# Patient Record
Sex: Female | Born: 1955 | Race: White | Hispanic: No | Marital: Married | Smoking: Former smoker
Health system: Southern US, Community
[De-identification: ages and names within clinical notes are randomized; demographics above are authoritative.]

## PROBLEM LIST (undated history)

## (undated) DIAGNOSIS — I5032 Chronic diastolic (congestive) heart failure: Secondary | ICD-10-CM

## (undated) DIAGNOSIS — I10 Essential (primary) hypertension: Secondary | ICD-10-CM

## (undated) DIAGNOSIS — K219 Gastro-esophageal reflux disease without esophagitis: Secondary | ICD-10-CM

## (undated) DIAGNOSIS — T8859XA Other complications of anesthesia, initial encounter: Secondary | ICD-10-CM

## (undated) DIAGNOSIS — E785 Hyperlipidemia, unspecified: Secondary | ICD-10-CM

## (undated) HISTORY — DX: Chronic diastolic (congestive) heart failure: I50.32

## (undated) HISTORY — PX: ABDOMINAL HYSTERECTOMY: SHX81

## (undated) HISTORY — PX: TONSILLECTOMY: SUR1361

## (undated) HISTORY — PX: BACK SURGERY: SHX140

## (undated) HISTORY — PX: HEEL SPUR EXCISION: SHX1733

## (undated) HISTORY — DX: Gastro-esophageal reflux disease without esophagitis: K21.9

---

## 1998-02-22 ENCOUNTER — Encounter: Admission: RE | Admit: 1998-02-22 | Discharge: 1998-05-23 | Payer: Self-pay | Admitting: Anesthesiology

## 1998-06-04 ENCOUNTER — Encounter: Payer: Self-pay | Admitting: *Deleted

## 1998-06-05 ENCOUNTER — Encounter: Payer: Self-pay | Admitting: *Deleted

## 1998-06-05 ENCOUNTER — Inpatient Hospital Stay (HOSPITAL_COMMUNITY): Admission: RE | Admit: 1998-06-05 | Discharge: 1998-06-07 | Payer: Self-pay | Admitting: *Deleted

## 1998-06-07 ENCOUNTER — Encounter: Payer: Self-pay | Admitting: *Deleted

## 1998-10-28 ENCOUNTER — Encounter: Payer: Self-pay | Admitting: Emergency Medicine

## 1998-10-28 ENCOUNTER — Emergency Department (HOSPITAL_COMMUNITY): Admission: EM | Admit: 1998-10-28 | Discharge: 1998-10-28 | Payer: Self-pay | Admitting: Emergency Medicine

## 1998-11-22 ENCOUNTER — Ambulatory Visit (HOSPITAL_COMMUNITY): Admission: RE | Admit: 1998-11-22 | Discharge: 1998-11-22 | Payer: Self-pay | Admitting: *Deleted

## 1998-11-22 ENCOUNTER — Encounter: Payer: Self-pay | Admitting: *Deleted

## 1999-11-25 ENCOUNTER — Other Ambulatory Visit: Admission: RE | Admit: 1999-11-25 | Discharge: 1999-11-25 | Payer: Self-pay | Admitting: *Deleted

## 1999-12-12 ENCOUNTER — Encounter: Payer: Self-pay | Admitting: *Deleted

## 1999-12-12 ENCOUNTER — Encounter: Admission: RE | Admit: 1999-12-12 | Discharge: 1999-12-12 | Payer: Self-pay | Admitting: *Deleted

## 1999-12-16 ENCOUNTER — Encounter: Admission: RE | Admit: 1999-12-16 | Discharge: 1999-12-16 | Payer: Self-pay | Admitting: *Deleted

## 1999-12-16 ENCOUNTER — Encounter: Payer: Self-pay | Admitting: *Deleted

## 2003-05-21 ENCOUNTER — Emergency Department (HOSPITAL_COMMUNITY): Admission: EM | Admit: 2003-05-21 | Discharge: 2003-05-21 | Payer: Self-pay | Admitting: Emergency Medicine

## 2004-03-12 ENCOUNTER — Emergency Department (HOSPITAL_COMMUNITY): Admission: EM | Admit: 2004-03-12 | Discharge: 2004-03-12 | Payer: Self-pay | Admitting: Emergency Medicine

## 2006-02-26 ENCOUNTER — Encounter: Admission: RE | Admit: 2006-02-26 | Discharge: 2006-02-26 | Payer: Self-pay | Admitting: Family Medicine

## 2011-05-28 ENCOUNTER — Other Ambulatory Visit (HOSPITAL_COMMUNITY): Payer: Self-pay | Admitting: Physician Assistant

## 2011-05-28 DIAGNOSIS — Z139 Encounter for screening, unspecified: Secondary | ICD-10-CM

## 2011-06-02 ENCOUNTER — Other Ambulatory Visit: Payer: Self-pay | Admitting: Obstetrics and Gynecology

## 2011-06-02 DIAGNOSIS — Z139 Encounter for screening, unspecified: Secondary | ICD-10-CM

## 2011-06-03 ENCOUNTER — Ambulatory Visit (HOSPITAL_COMMUNITY)
Admission: RE | Admit: 2011-06-03 | Discharge: 2011-06-03 | Disposition: A | Discharge status: Home / Self Care | Payer: Self-pay | Source: Ambulatory Visit | Attending: Physician Assistant | Admitting: Physician Assistant

## 2011-06-03 DIAGNOSIS — Z139 Encounter for screening, unspecified: Secondary | ICD-10-CM

## 2011-07-02 ENCOUNTER — Ambulatory Visit (HOSPITAL_COMMUNITY)
Admission: RE | Admit: 2011-07-02 | Discharge: 2011-07-02 | Disposition: A | Discharge status: Home / Self Care | Payer: Self-pay | Source: Ambulatory Visit | Attending: Physician Assistant | Admitting: Physician Assistant

## 2011-07-02 ENCOUNTER — Other Ambulatory Visit (HOSPITAL_COMMUNITY): Payer: Self-pay | Admitting: Physician Assistant

## 2011-07-02 DIAGNOSIS — R609 Edema, unspecified: Secondary | ICD-10-CM

## 2011-07-02 DIAGNOSIS — M7989 Other specified soft tissue disorders: Secondary | ICD-10-CM | POA: Insufficient documentation

## 2012-03-10 ENCOUNTER — Other Ambulatory Visit (HOSPITAL_COMMUNITY): Payer: Self-pay | Admitting: Physician Assistant

## 2012-03-10 ENCOUNTER — Ambulatory Visit (HOSPITAL_COMMUNITY)
Admission: RE | Admit: 2012-03-10 | Discharge: 2012-03-10 | Disposition: A | Discharge status: Home / Self Care | Payer: Self-pay | Source: Ambulatory Visit | Attending: Physician Assistant | Admitting: Physician Assistant

## 2012-03-10 DIAGNOSIS — R52 Pain, unspecified: Secondary | ICD-10-CM

## 2012-03-10 DIAGNOSIS — W19XXXA Unspecified fall, initial encounter: Secondary | ICD-10-CM | POA: Insufficient documentation

## 2012-03-10 DIAGNOSIS — S4980XA Other specified injuries of shoulder and upper arm, unspecified arm, initial encounter: Secondary | ICD-10-CM | POA: Insufficient documentation

## 2012-03-10 DIAGNOSIS — M25519 Pain in unspecified shoulder: Secondary | ICD-10-CM | POA: Insufficient documentation

## 2012-03-10 DIAGNOSIS — S46909A Unspecified injury of unspecified muscle, fascia and tendon at shoulder and upper arm level, unspecified arm, initial encounter: Secondary | ICD-10-CM | POA: Insufficient documentation

## 2012-05-10 ENCOUNTER — Other Ambulatory Visit (HOSPITAL_COMMUNITY): Payer: Self-pay | Admitting: Physician Assistant

## 2012-05-10 DIAGNOSIS — M25512 Pain in left shoulder: Secondary | ICD-10-CM

## 2012-05-13 ENCOUNTER — Ambulatory Visit (HOSPITAL_COMMUNITY): Payer: Self-pay

## 2012-06-10 ENCOUNTER — Other Ambulatory Visit (HOSPITAL_COMMUNITY): Payer: Self-pay | Admitting: Physician Assistant

## 2012-06-10 DIAGNOSIS — Z139 Encounter for screening, unspecified: Secondary | ICD-10-CM

## 2012-06-21 ENCOUNTER — Ambulatory Visit (HOSPITAL_COMMUNITY)
Admission: RE | Admit: 2012-06-21 | Discharge: 2012-06-21 | Disposition: A | Discharge status: Home / Self Care | Payer: Self-pay | Source: Ambulatory Visit | Attending: Physician Assistant | Admitting: Physician Assistant

## 2012-06-21 DIAGNOSIS — Z139 Encounter for screening, unspecified: Secondary | ICD-10-CM

## 2013-07-12 ENCOUNTER — Emergency Department (HOSPITAL_COMMUNITY): Payer: Self-pay

## 2013-07-12 ENCOUNTER — Encounter (HOSPITAL_COMMUNITY): Payer: Self-pay | Admitting: Emergency Medicine

## 2013-07-12 ENCOUNTER — Emergency Department (HOSPITAL_COMMUNITY)
Admission: EM | Admit: 2013-07-12 | Discharge: 2013-07-12 | Disposition: A | Discharge status: Home / Self Care | Payer: Self-pay | Attending: Emergency Medicine | Admitting: Emergency Medicine

## 2013-07-12 DIAGNOSIS — S300XXA Contusion of lower back and pelvis, initial encounter: Secondary | ICD-10-CM

## 2013-07-12 DIAGNOSIS — Z7982 Long term (current) use of aspirin: Secondary | ICD-10-CM | POA: Insufficient documentation

## 2013-07-12 DIAGNOSIS — I10 Essential (primary) hypertension: Secondary | ICD-10-CM | POA: Insufficient documentation

## 2013-07-12 DIAGNOSIS — IMO0002 Reserved for concepts with insufficient information to code with codable children: Secondary | ICD-10-CM | POA: Insufficient documentation

## 2013-07-12 DIAGNOSIS — Y939 Activity, unspecified: Secondary | ICD-10-CM | POA: Insufficient documentation

## 2013-07-12 DIAGNOSIS — S8392XA Sprain of unspecified site of left knee, initial encounter: Secondary | ICD-10-CM

## 2013-07-12 DIAGNOSIS — E785 Hyperlipidemia, unspecified: Secondary | ICD-10-CM | POA: Insufficient documentation

## 2013-07-12 DIAGNOSIS — Y9229 Other specified public building as the place of occurrence of the external cause: Secondary | ICD-10-CM | POA: Insufficient documentation

## 2013-07-12 DIAGNOSIS — Z79899 Other long term (current) drug therapy: Secondary | ICD-10-CM | POA: Insufficient documentation

## 2013-07-12 DIAGNOSIS — W010XXA Fall on same level from slipping, tripping and stumbling without subsequent striking against object, initial encounter: Secondary | ICD-10-CM | POA: Insufficient documentation

## 2013-07-12 DIAGNOSIS — S20229A Contusion of unspecified back wall of thorax, initial encounter: Secondary | ICD-10-CM | POA: Insufficient documentation

## 2013-07-12 HISTORY — DX: Hyperlipidemia, unspecified: E78.5

## 2013-07-12 HISTORY — DX: Essential (primary) hypertension: I10

## 2013-07-12 MED ORDER — CYCLOBENZAPRINE HCL 10 MG PO TABS: 10.0000 mg | ORAL_TABLET | Freq: Three times a day (TID) | ORAL | Status: DC | PRN

## 2013-07-12 MED ORDER — IBUPROFEN 800 MG PO TABS
800.0000 mg | ORAL_TABLET | Freq: Once | ORAL | Status: AC
  Administered 2013-07-12: 800 mg via ORAL
  Filled 2013-07-12: qty 1

## 2013-07-12 MED ORDER — IBUPROFEN 800 MG PO TABS: 800.0000 mg | ORAL_TABLET | Freq: Three times a day (TID) | ORAL | Status: DC

## 2013-07-12 MED ORDER — HYDROCODONE-ACETAMINOPHEN 5-325 MG PO TABS: ORAL_TABLET | ORAL | Status: DC

## 2013-07-12 NOTE — ED Notes (Signed)
Walks slowly, Alert, NAD, pain lt knee , lt low back, after fall at noon today.  Denies LOC

## 2013-07-12 NOTE — ED Notes (Signed)
Pain left side,  Knee , low back, onset 12 noon when slipped on wet floor at [facility].  No LOC.  Alert, No HI

## 2013-07-12 NOTE — ED Notes (Signed)
Ace wrap to lt knee.  

## 2013-07-14 NOTE — ED Provider Notes (Signed)
CSN: [id number]     Arrival date & time 07/12/13  1350 History   First MD Initiated Contact with Patient 07/12/13 1431     Chief Complaint  Patient presents with  . Fall   (Consider location/radiation/quality/duration/timing/severity/associated sxs/prior Treatment) Patient is a 57 y.o. female presenting with fall. The history is provided by the patient.  Fall This is a new problem. The current episode started today. The problem occurs constantly. The problem has been unchanged. Associated symptoms include arthralgias. Pertinent negatives include no abdominal pain, chest pain, chills, diaphoresis, fever, headaches, joint swelling, nausea, neck pain, numbness, urinary symptoms, visual change, vomiting or weakness. The symptoms are aggravated by standing and walking. She has tried nothing for the symptoms. The treatment provided no relief.   Patient c/o low back pain, left knee and ankle pain after a fall at a [employer].  She denies Head injury, neck pain, dizziness or LOC.  Past Medical History  Diagnosis Date  . Hypertension   . Hyperlipemia    Past Surgical History  Procedure Laterality Date  . Abdominal hysterectomy    . Heel spur excision    . Back surgery    . Tonsillectomy     History reviewed. No pertinent family history. History  Substance Use Topics  . Smoking status: Never Smoker   . Smokeless tobacco: Not on file  . Alcohol Use: No   OB History   Grav Para Term Preterm Abortions TAB SAB Ect Mult Living                 Review of Systems  Constitutional: Negative for fever, chills and diaphoresis.  Respiratory: Negative for shortness of breath.   Cardiovascular: Negative for chest pain.  Gastrointestinal: Negative for nausea, vomiting and abdominal pain.  Genitourinary: Negative for dysuria, hematuria and difficulty urinating.  Musculoskeletal: Positive for arthralgias. Negative for joint swelling and neck pain.  Skin: Negative for color change and wound.   Neurological: Negative for dizziness, syncope, speech difficulty, weakness, numbness and headaches.  All other systems reviewed and are negative.    Allergies  Neurontin  Home Medications   Current Outpatient Rx  Name  Route  Sig  Dispense  Refill  . aspirin 81 MG chewable tablet   Oral   Chew 81 mg by mouth daily.         Marland Kitchen CALCIUM PO   Oral   Take 1 tablet by mouth daily.         Marland Kitchen lisinopril-hydrochlorothiazide (PRINZIDE,ZESTORETIC) 20-12.5 MG per tablet   Oral   Take 2 tablets by mouth daily.         . Multiple Vitamins-Minerals (WOMENS DAILY FORMULA PO)   Oral   Take 1 tablet by mouth daily.         . Omega-3 Fatty Acids (FISH OIL) 1000 MG CAPS   Oral   Take 2,000 capsules by mouth 2 (two) times daily.         . ranitidine (ZANTAC) 300 MG tablet   Oral   Take 300 mg by mouth daily as needed for heartburn.         . cyclobenzaprine (FLEXERIL) 10 MG tablet   Oral   Take 1 tablet (10 mg total) by mouth 3 (three) times daily as needed.   21 tablet   0   . HYDROcodone-acetaminophen (NORCO/VICODIN) 5-325 MG per tablet      Take one-two tabs po q 4-6 hrs prn pain   12 tablet   0   .  ibuprofen (ADVIL,MOTRIN) 800 MG tablet   Oral   Take 1 tablet (800 mg total) by mouth 3 (three) times daily.   21 tablet   0    BP 147/68  Pulse 94  Temp(Src) 98.4 F (36.9 C) (Oral)  Ht 5\' 9"  (1.753 m)  Wt 280 lb (127.007 kg)  BMI 41.33 kg/m2  SpO2 100% Physical Exam  Nursing note and vitals reviewed. Constitutional: She is oriented to person, place, and time. She appears well-developed and well-nourished. No distress.  HENT:  Head: Normocephalic and atraumatic.  Eyes: EOM are normal. Pupils are equal, round, and reactive to light.  Neck: Normal range of motion. Neck supple.  Cardiovascular: Normal rate, regular rhythm, normal heart sounds and intact distal pulses.   No murmur heard. Pulmonary/Chest: Effort normal and breath sounds normal. No respiratory  distress. She exhibits no tenderness.  Abdominal: Soft. She exhibits no distension. There is no tenderness. There is no rebound and no guarding.  Musculoskeletal: She exhibits tenderness.  ttp of the anterior left knee and lateral left ankle.  No erythema, effusion, or step-off deformity.  DP pulse brisk, distal sensation intact. Calf is soft and NT.  Left hip is NT.  Left lumbar paraspinal muscles are ttp .  No bruising or edema.    Neurological: She is alert and oriented to person, place, and time. She exhibits normal muscle tone. Coordination normal.  Skin: Skin is warm and dry. No erythema.    ED Course  Procedures (including critical care time) Labs Review Labs Reviewed - No data to display Imaging Review Dg Lumbar Spine Complete  07/12/2013   CLINICAL DATA:  Low back pain following a fall.  EXAM: LUMBAR SPINE - COMPLETE 4+ VIEW  COMPARISON:  None.  FINDINGS: Five non-rib-bearing lumbar vertebrae. Mild dextroconvex rotary scoliosis. 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 Mild to moderate anterior and lateral spur formation at multiple levels. Facet degenerative changes in the lower lumbar spine. No fractures, pars defects or subluxations seen.  IMPRESSION: 1. No fracture or subluxation. 2. Degenerative and postoperative changes, as described above.   Electronically Signed   By: Gordan Payment M.D.   On: 07/12/2013 15:30   Dg Ankle Complete Left  07/12/2013   CLINICAL DATA:  Left ankle pain and swelling following a fall.  EXAM: LEFT ANKLE COMPLETE - 3+ VIEW  COMPARISON:  None.  FINDINGS: Large calcaneal spurs.  No fracture, dislocation or effusion seen.  IMPRESSION: No fracture.   Electronically Signed   By: Gordan Payment M.D.   On: 07/12/2013 15:26   Dg Knee Complete 4 Views Left  07/12/2013   CLINICAL DATA:  Left knee pain and swelling following a fall.  EXAM: LEFT KNEE - COMPLETE 4+ VIEW  COMPARISON:  None.  FINDINGS: Mild degenerative changes.  No fracture, dislocation or  effusion.  IMPRESSION: No fracture or effusion.   Electronically Signed   By: Gordan Payment M.D.   On: 07/12/2013 15:29    EKG Interpretation   None       MDM   1. Knee sprain, left, initial encounter   2. Lumbar contusion, initial encounter    X-ray findings reviewed and discussed with pt.  Remains NV intact.  Pt agrees to RICE therapy and close f/u with her PMD or orthopedics.  Incidental heel spur on imaging, advised pt to podiatry f/u, referral  Given. Pt ambulatory, no focal neuro deficits.       Angle Dirusso L. Trisha Mangle, PA-C 07/14/13 2131

## 2013-07-15 NOTE — ED Provider Notes (Signed)
Medical screening examination/treatment/procedure(s) were performed by non-physician practitioner and as supervising physician I was immediately available for consultation/collaboration.  EKG Interpretation   None         Emmi Wertheim L Nachman Sundt, MD 07/15/13 1536 

## 2013-07-19 ENCOUNTER — Other Ambulatory Visit (HOSPITAL_COMMUNITY): Payer: Self-pay | Admitting: Physician Assistant

## 2013-07-19 ENCOUNTER — Other Ambulatory Visit (HOSPITAL_COMMUNITY): Payer: Self-pay | Admitting: *Deleted

## 2013-07-19 DIAGNOSIS — Z139 Encounter for screening, unspecified: Secondary | ICD-10-CM

## 2013-08-02 ENCOUNTER — Ambulatory Visit (HOSPITAL_COMMUNITY)
Admission: RE | Admit: 2013-08-02 | Discharge: 2013-08-02 | Disposition: A | Discharge status: Home / Self Care | Payer: Self-pay | Source: Ambulatory Visit | Attending: Physician Assistant | Admitting: Physician Assistant

## 2013-08-02 DIAGNOSIS — Z139 Encounter for screening, unspecified: Secondary | ICD-10-CM

## 2014-01-12 ENCOUNTER — Telehealth (HOSPITAL_COMMUNITY): Payer: Self-pay | Admitting: Dietician

## 2014-01-12 NOTE — Telephone Encounter (Signed)
Received call from pt at 1113. She is scheduled for group DM class tonight, however, she reports that she is unable to attend due to illness (sore throat). This RD offered to reschedule, but she declined at this time.

## 2014-07-26 ENCOUNTER — Other Ambulatory Visit (HOSPITAL_COMMUNITY): Payer: Self-pay | Admitting: Family Medicine

## 2014-07-26 ENCOUNTER — Ambulatory Visit (HOSPITAL_COMMUNITY)
Admission: RE | Admit: 2014-07-26 | Discharge: 2014-07-26 | Disposition: A | Discharge status: Home / Self Care | Payer: 59 | Source: Ambulatory Visit | Attending: Family Medicine | Admitting: Family Medicine

## 2014-07-26 DIAGNOSIS — M79641 Pain in right hand: Secondary | ICD-10-CM

## 2014-07-26 DIAGNOSIS — M25741 Osteophyte, right hand: Secondary | ICD-10-CM | POA: Insufficient documentation

## 2014-07-26 DIAGNOSIS — R229 Localized swelling, mass and lump, unspecified: Secondary | ICD-10-CM | POA: Diagnosis present

## 2014-08-15 ENCOUNTER — Other Ambulatory Visit (HOSPITAL_COMMUNITY): Payer: Self-pay | Admitting: Family Medicine

## 2014-08-15 DIAGNOSIS — Z1231 Encounter for screening mammogram for malignant neoplasm of breast: Secondary | ICD-10-CM

## 2014-08-21 ENCOUNTER — Ambulatory Visit (HOSPITAL_COMMUNITY)
Admission: RE | Admit: 2014-08-21 | Discharge: 2014-08-21 | Disposition: A | Discharge status: Home / Self Care | Payer: 59 | Source: Ambulatory Visit | Attending: Family Medicine | Admitting: Family Medicine

## 2014-08-21 DIAGNOSIS — Z1231 Encounter for screening mammogram for malignant neoplasm of breast: Secondary | ICD-10-CM | POA: Insufficient documentation

## 2014-08-21 DIAGNOSIS — N63 Unspecified lump in breast: Secondary | ICD-10-CM | POA: Diagnosis not present

## 2014-08-25 ENCOUNTER — Other Ambulatory Visit: Payer: Self-pay | Admitting: Internal Medicine

## 2014-08-25 DIAGNOSIS — R928 Other abnormal and inconclusive findings on diagnostic imaging of breast: Secondary | ICD-10-CM

## 2014-09-01 ENCOUNTER — Ambulatory Visit
Admission: RE | Admit: 2014-09-01 | Discharge: 2014-09-01 | Disposition: A | Discharge status: Home / Self Care | Payer: 59 | Source: Ambulatory Visit | Attending: Internal Medicine | Admitting: Internal Medicine

## 2014-09-01 DIAGNOSIS — R928 Other abnormal and inconclusive findings on diagnostic imaging of breast: Secondary | ICD-10-CM

## 2015-02-26 ENCOUNTER — Ambulatory Visit (HOSPITAL_COMMUNITY)
Admission: RE | Admit: 2015-02-26 | Discharge: 2015-02-26 | Disposition: A | Discharge status: Home / Self Care | Payer: 59 | Source: Ambulatory Visit | Attending: Physician Assistant | Admitting: Physician Assistant

## 2015-02-26 ENCOUNTER — Other Ambulatory Visit (HOSPITAL_COMMUNITY): Payer: Self-pay | Admitting: Physician Assistant

## 2015-02-26 DIAGNOSIS — M11261 Other chondrocalcinosis, right knee: Secondary | ICD-10-CM | POA: Insufficient documentation

## 2015-02-26 DIAGNOSIS — M25561 Pain in right knee: Secondary | ICD-10-CM | POA: Diagnosis present

## 2015-02-26 DIAGNOSIS — X58XXXA Exposure to other specified factors, initial encounter: Secondary | ICD-10-CM | POA: Diagnosis not present

## 2015-02-26 DIAGNOSIS — S8991XA Unspecified injury of right lower leg, initial encounter: Secondary | ICD-10-CM | POA: Insufficient documentation

## 2015-04-07 ENCOUNTER — Emergency Department (HOSPITAL_COMMUNITY)
Admission: EM | Admit: 2015-04-07 | Discharge: 2015-04-07 | Disposition: A | Discharge status: Home / Self Care | Payer: 59 | Attending: Emergency Medicine | Admitting: Emergency Medicine

## 2015-04-07 ENCOUNTER — Encounter (HOSPITAL_COMMUNITY): Payer: Self-pay | Admitting: Emergency Medicine

## 2015-04-07 DIAGNOSIS — Y998 Other external cause status: Secondary | ICD-10-CM | POA: Insufficient documentation

## 2015-04-07 DIAGNOSIS — Z79899 Other long term (current) drug therapy: Secondary | ICD-10-CM | POA: Insufficient documentation

## 2015-04-07 DIAGNOSIS — Z8639 Personal history of other endocrine, nutritional and metabolic disease: Secondary | ICD-10-CM | POA: Insufficient documentation

## 2015-04-07 DIAGNOSIS — I10 Essential (primary) hypertension: Secondary | ICD-10-CM | POA: Diagnosis not present

## 2015-04-07 DIAGNOSIS — S4992XA Unspecified injury of left shoulder and upper arm, initial encounter: Secondary | ICD-10-CM | POA: Diagnosis present

## 2015-04-07 DIAGNOSIS — Y9389 Activity, other specified: Secondary | ICD-10-CM | POA: Diagnosis not present

## 2015-04-07 DIAGNOSIS — Z7982 Long term (current) use of aspirin: Secondary | ICD-10-CM | POA: Diagnosis not present

## 2015-04-07 DIAGNOSIS — Y9289 Other specified places as the place of occurrence of the external cause: Secondary | ICD-10-CM | POA: Insufficient documentation

## 2015-04-07 DIAGNOSIS — S46912A Strain of unspecified muscle, fascia and tendon at shoulder and upper arm level, left arm, initial encounter: Secondary | ICD-10-CM

## 2015-04-07 DIAGNOSIS — X58XXXA Exposure to other specified factors, initial encounter: Secondary | ICD-10-CM | POA: Diagnosis not present

## 2015-04-07 MED ORDER — MIDAZOLAM HCL 5 MG/5ML IJ SOLN
2.0000 mg | Freq: Once | INTRAMUSCULAR | Status: AC
  Administered 2015-04-07: 2 mg via INTRAMUSCULAR
  Filled 2015-04-07: qty 5

## 2015-04-07 MED ORDER — ONDANSETRON HCL 4 MG/2ML IJ SOLN
4.0000 mg | Freq: Once | INTRAMUSCULAR | Status: AC
  Administered 2015-04-07: 4 mg via INTRAMUSCULAR
  Filled 2015-04-07: qty 2

## 2015-04-07 MED ORDER — HYDROMORPHONE HCL 1 MG/ML IJ SOLN
1.0000 mg | Freq: Once | INTRAMUSCULAR | Status: AC
  Administered 2015-04-07: 1 mg via INTRAVENOUS
  Filled 2015-04-07: qty 1

## 2015-04-07 MED ORDER — KETOROLAC TROMETHAMINE 60 MG/2ML IM SOLN
60.0000 mg | Freq: Once | INTRAMUSCULAR | Status: AC
  Administered 2015-04-07: 60 mg via INTRAMUSCULAR
  Filled 2015-04-07: qty 2

## 2015-04-07 MED ORDER — DIAZEPAM 5 MG/ML IJ SOLN
5.0000 mg | Freq: Once | INTRAMUSCULAR | Status: AC
  Administered 2015-04-07: 5 mg via INTRAVENOUS
  Filled 2015-04-07: qty 2

## 2015-04-07 NOTE — ED Provider Notes (Signed)
CSN: [id number]     Arrival date & time 04/07/15  1350 History   First MD Initiated Contact with Patient 04/07/15 1424     Chief Complaint  Patient presents with  . Shoulder Pain     (Consider location/radiation/quality/duration/timing/severity/associated sxs/prior Treatment) Patient is a 59 y.o. female presenting with shoulder pain. The history is provided by the patient.  Shoulder Pain Location:  Shoulder Time since incident:  2 days Injury: no   Shoulder location:  L shoulder Pain details:    Quality:  Sharp and cramping   Severity:  Moderate   Onset quality:  Sudden   Duration:  2 days   Timing:  Constant   Progression:  Worsening Chronicity:  New Handedness:  Right-handed Dislocation: no   Prior injury to area:  No Relieved by:  Nothing Worsened by:  Movement Associated symptoms: neck pain and stiffness   Associated symptoms: no numbness and no tingling   Risk factors: no frequent fractures and no recent illness     Past Medical History  Diagnosis Date  . Hypertension   . Hyperlipemia    Past Surgical History  Procedure Laterality Date  . Abdominal hysterectomy    . Heel spur excision    . Back surgery    . Tonsillectomy     History reviewed. No pertinent family history. Social History  Substance Use Topics  . Smoking status: Never Smoker   . Smokeless tobacco: None  . Alcohol Use: No   OB History    No data available     Review of Systems  Musculoskeletal: Positive for arthralgias, stiffness and neck pain.  All other systems reviewed and are negative.     Allergies  Neurontin  Home Medications   Prior to Admission medications   Medication Sig Start Date End Date Taking? Authorizing Kailena Lubas  aspirin 81 MG chewable tablet Chew 81 mg by mouth daily.    Historical Edana Aguado, MD  CALCIUM PO Take 1 tablet by mouth daily.    Historical Tula Schryver, MD  cyclobenzaprine (FLEXERIL) 10 MG tablet Take 1 tablet (10 mg total) by mouth 3 (three) times  daily as needed. 07/12/13   Tammy Triplett, PA-C  HYDROcodone-acetaminophen (NORCO/VICODIN) 5-325 MG per tablet Take one-two tabs po q 4-6 hrs prn pain 07/12/13   Tammy Triplett, PA-C  ibuprofen (ADVIL,MOTRIN) 800 MG tablet Take 1 tablet (800 mg total) by mouth 3 (three) times daily. 07/12/13   Tammy Triplett, PA-C  lisinopril-hydrochlorothiazide (PRINZIDE,ZESTORETIC) 20-12.5 MG per tablet Take 2 tablets by mouth daily.    Historical Connar Keating, MD  Multiple Vitamins-Minerals (WOMENS DAILY FORMULA PO) Take 1 tablet by mouth daily.    Historical Toma Erichsen, MD  Omega-3 Fatty Acids (FISH OIL) 1000 MG CAPS Take 2,000 capsules by mouth 2 (two) times daily.    Historical Enmanuel Zufall, MD  ranitidine (ZANTAC) 300 MG tablet Take 300 mg by mouth daily as needed for heartburn.    Historical Jamesmichael Shadd, MD   BP 124/78 mmHg  Pulse 67  Temp(Src) 97.6 F (36.4 C) (Oral)  Resp 18  Ht 5\' 8"  (1.727 m)  Wt 304 lb (137.893 kg)  BMI 46.23 kg/m2  SpO2 99% Physical Exam  Constitutional: She is oriented to person, place, and time. She appears well-developed and well-nourished.  Non-toxic appearance.  HENT:  Head: Normocephalic.  Right Ear: Tympanic membrane and external ear normal.  Left Ear: Tympanic membrane and external ear normal.  Eyes: EOM and lids are normal. Pupils are equal, round, and reactive to light.  Neck: Normal range of motion. Neck supple. Carotid bruit is not present.  No carotid bruit present  Cardiovascular: Normal rate, regular rhythm, normal heart sounds, intact distal pulses and normal pulses.   Pulmonary/Chest: Breath sounds normal. No respiratory distress.  Abdominal: Soft. Bowel sounds are normal. There is no tenderness. There is no guarding.  Musculoskeletal:       Left shoulder: She exhibits decreased range of motion, tenderness, pain and spasm. She exhibits no deformity.       Arms: Lymphadenopathy:       Head (right side): No submandibular adenopathy present.       Head (left side):  No submandibular adenopathy present.    She has no cervical adenopathy.  Neurological: She is alert and oriented to person, place, and time. She has normal strength. No cranial nerve deficit or sensory deficit.  Skin: Skin is warm and dry.  Psychiatric: She has a normal mood and affect. Her speech is normal.  Nursing note and vitals reviewed.   ED Course  Procedures (including critical care time) Labs Review Labs Reviewed - No data to display  Imaging Review No results found. I have personally reviewed and evaluated these images and lab results as part of my medical decision-making.   EKG Interpretation None      MDM Patient presents with left shoulder pain that is getting progressively worse. She states she is not sure if she slept on it wrong, or if it was related to holding a new grandchild in an awkward position. The patient is not dropping objects, but has pain with certain movements. The pain is slow to respond to muscle relaxer and pain medication.  No evidence of hot or septic joint. No evidence for dislocation. No vascular compromise appreciated. Suspect that the patient has torticollis/lower trapezius strain. The patient is treated with intramuscular Versed and Toradol.  3:56pm - Pt states the pain and spasm are worse. Exam reveals palpable spasm of he bicep/tricep area and the lower trapezius. IV dilaudid and valium given to the patient.  5:04PM - Pt much more comfortable. Spasm almost resolved. Pt feels she can go home and use current meds. Advised pt she has received a large dose of meds, and to use caution getting around. Husband will observe her closely.   Final diagnoses:  None    **I have reviewed nursing notes, vital signs, and all appropriate lab and imaging results for this patient.Lily Kocher, PA-C 04/07/15 [place], MD 04/08/15 [phone]

## 2015-04-07 NOTE — Discharge Instructions (Signed)
Your examination favors a condition called torticollis. Please relax your shoulder and arm is much as possible. Heating pad may be helpful. Please continue your current medications. You were treated tonight with intravenous and intramuscular muscle relaxing medications. You also treated with narcotic pain medications. Please use extreme caution getting around this evening.  Shoulder Sprain A shoulder sprain is the result of damage to the tough, fiber-like tissues (ligaments) that help hold your shoulder in place. The ligaments may be stretched or torn. Besides the main shoulder joint (the ball and socket), there are several smaller joints that connect the bones in this area. A sprain usually involves one of those joints. Most often it is the acromioclavicular (or AC) joint. That is the joint that connects the collarbone (clavicle) and the shoulder blade (scapula) at the top point of the shoulder blade (acromion). A shoulder sprain is a mild form of what is called a shoulder separation. Recovering from a shoulder sprain may take some time. For some, pain lingers for several months. Most people recover without long term problems. CAUSES   A shoulder sprain is usually caused by some kind of trauma. This might be:  Falling on an outstretched arm.  Being hit hard on the shoulder.  Twisting the arm.  Shoulder sprains are more likely to occur in people who:  Play sports.  Have balance or coordination problems. SYMPTOMS   Pain when you move your shoulder.  Limited ability to move the shoulder.  Swelling and tenderness on top of the shoulder.  Redness or warmth in the shoulder.  Bruising.  A change in the shape of the shoulder. DIAGNOSIS  Your healthcare provider may:  Ask about your symptoms.  Ask about recent activity that might have caused those symptoms.  Examine your shoulder. You may be asked to do simple exercises to test movement. The other shoulder will be examined for  comparison.  Order some tests that provide a look inside the body. They can show the extent of the injury. The tests could include:  X-rays.  CT (computed tomography) scan.  MRI (magnetic resonance imaging) scan. RISKS AND COMPLICATIONS  Loss of full shoulder motion.  Ongoing shoulder pain. TREATMENT  How long it takes to recover from a shoulder sprain depends on how severe it was. Treatment options may include:  Rest. You should not use the arm or shoulder until it heals.  Ice. For 2 or 3 days after the injury, put an ice pack on the shoulder up to 4 times a day. It should stay on for 15 to 20 minutes each time. Wrap the ice in a towel so it does not touch your skin.  Over-the-counter medicine to relieve pain.  A sling or brace. This will keep the arm still while the shoulder is healing.  Physical therapy or rehabilitation exercises. These will help you regain strength and motion. Ask your healthcare provider when it is OK to begin these exercises.  Surgery. The need for surgery is rare with a sprained shoulder, but some people may need surgery to keep the joint in place and reduce pain. HOME CARE INSTRUCTIONS   Ask your healthcare provider about what you should and should not do while your shoulder heals.  Make sure you know how to apply ice to the correct area of your shoulder.  Talk with your healthcare provider about which medications should be used for pain and swelling.  If rehabilitation therapy will be needed, ask your healthcare provider to refer you to a  therapist. If it is not recommended, then ask about at-home exercises. Find out when exercise should begin. SEEK MEDICAL CARE IF:  Your pain, swelling, or redness at the joint increases. SEEK IMMEDIATE MEDICAL CARE IF:   You have a fever.  You cannot move your arm or shoulder. Document Released: 11/23/2008 Document Revised: 09/29/2011 Document Reviewed: 11/23/2008 [facility] Patient Information 2015 [place],  [place]. This information is not intended to replace advice given to you by your health care provider. Make sure you discuss any questions you have with your health care provider.

## 2015-04-07 NOTE — ED Notes (Signed)
Pt co "muscle pain " under her Shoulder blade - started Thursday- thinks that she has pulled a muscle - Seen by MD and given "shot for inflammation " and started on hydrocodones and Flexeril but not helping - Then Friday she noted some lumps in her lt arm

## 2015-04-23 ENCOUNTER — Other Ambulatory Visit (HOSPITAL_COMMUNITY): Payer: Self-pay | Admitting: Internal Medicine

## 2015-04-23 DIAGNOSIS — M5124 Other intervertebral disc displacement, thoracic region: Secondary | ICD-10-CM

## 2015-05-02 ENCOUNTER — Other Ambulatory Visit: Payer: Self-pay | Admitting: Physician Assistant

## 2015-05-10 ENCOUNTER — Ambulatory Visit (HOSPITAL_COMMUNITY)
Admission: RE | Admit: 2015-05-10 | Discharge: 2015-05-10 | Disposition: A | Discharge status: Home / Self Care | Payer: 59 | Source: Ambulatory Visit | Attending: Internal Medicine | Admitting: Internal Medicine

## 2015-05-10 DIAGNOSIS — M5124 Other intervertebral disc displacement, thoracic region: Secondary | ICD-10-CM

## 2015-05-10 DIAGNOSIS — M542 Cervicalgia: Secondary | ICD-10-CM | POA: Insufficient documentation

## 2015-05-10 DIAGNOSIS — M50223 Other cervical disc displacement at C6-C7 level: Secondary | ICD-10-CM | POA: Insufficient documentation

## 2015-09-18 ENCOUNTER — Ambulatory Visit (INDEPENDENT_AMBULATORY_CARE_PROVIDER_SITE_OTHER): Payer: BLUE CROSS/BLUE SHIELD

## 2015-09-18 ENCOUNTER — Ambulatory Visit (INDEPENDENT_AMBULATORY_CARE_PROVIDER_SITE_OTHER): Payer: BLUE CROSS/BLUE SHIELD | Admitting: Orthopaedic Surgery

## 2015-09-18 VITALS — BP 117/66 | HR 61 | Temp 97.7°F | Ht 68.0 in | Wt 297.2 lb

## 2015-09-18 DIAGNOSIS — M25551 Pain in right hip: Secondary | ICD-10-CM | POA: Diagnosis not present

## 2015-09-18 DIAGNOSIS — M25561 Pain in right knee: Secondary | ICD-10-CM

## 2015-09-18 NOTE — Patient Instructions (Signed)
MRI ORDERED. We will contact your insurance company for pre-certification. After we receive that, we will schedule you an appointment for the MRI and contact you. If you have not heard from our office in one week, contact us.     

## 2015-09-18 NOTE — Progress Notes (Addendum)
Subjective:    Patient ID: Meagan Reyes, female    DOB: 1955-12-31, 60 y.o.   MRN: [id number]  Hip Pain  The pain is present in the right hip. The quality of the pain is described as aching. The pain is at a severity of 2/10. The pain is mild. The pain has been fluctuating since onset. Associated symptoms include an inability to bear weight and a loss of motion. Pertinent negatives include no loss of sensation, muscle weakness, numbness or tingling. The symptoms are aggravated by weight bearing. The treatment provided moderate relief.  Knee Pain  There was no injury mechanism. The pain is present in the right knee. The quality of the pain is described as aching and burning. The pain is at a severity of 4/10. The pain is moderate. The pain has been worsening since onset. Associated symptoms include an inability to bear weight and a loss of motion. Pertinent negatives include no loss of sensation, muscle weakness, numbness or tingling. The symptoms are aggravated by movement and weight bearing. She has tried ice, rest and acetaminophen for the symptoms. The treatment provided mild relief.   She has had pain more in the right knee for six weeks or so. She has no trauma.  She has giving way of the right knee and popping and swelling.  It hurts more medially.  Nothing seems to help it other than staying off it.  She has been seen by Dr. Gerarda Fraction at [facility].  At times the pain is a 7 of 10, other times it is a dull ache of 3 to 40 of ten.  She has been on Mobic in the past.   Review of Systems  Constitutional: Positive for fatigue.       She has diabetes. She has GERD She has hypertension She has had major foot surgery on the left in the past.  HENT: Negative for congestion.   Respiratory: Negative for cough and shortness of breath.   Cardiovascular: Negative for chest pain.  Musculoskeletal: Positive for myalgias, joint swelling, arthralgias and gait problem.  Neurological: Negative for  tingling and numbness.   Past Surgical History  Procedure Laterality Date  . Abdominal hysterectomy    . Heel spur excision    . Back surgery    . Tonsillectomy       . Social History   Social History  . Marital Status: Married    Spouse Name: N/A  . Number of Children: N/A  . Years of Education: N/A   Occupational History  . Not on file.   Social History Main Topics  . Smoking status: Never Smoker   . Smokeless tobacco: Not on file  . Alcohol Use: No  . Drug Use: No  . Sexual Activity: Yes    Birth Control/ Protection: Surgical   Other Topics Concern  . Not on file   Social History Narrative  The patient has a family history of hypertension, diabetes  BP 117/66 mmHg  Pulse 61  Temp(Src) 97.7 F (36.5 C)  Ht 5\' 8"  (1.727 m)  Wt 297 lb 3.2 oz (134.809 kg)  BMI 45.20 kg/m2  Objective:   Physical Exam  Constitutional: She is oriented to person, place, and time. She appears well-developed and well-nourished.  HENT:  Head: Normocephalic and atraumatic.  Eyes: Conjunctivae and EOM are normal. Pupils are equal, round, and reactive to light.  Neck: Normal range of motion. Neck supple.  Cardiovascular: Normal rate, regular rhythm, normal heart sounds and intact  distal pulses.   Pulmonary/Chest: Effort normal and breath sounds normal.  Abdominal: Soft.  Musculoskeletal: She exhibits tenderness (Pain righgt knee with effusion.  Knee is stable.  She has more medial pain. She has crepitus.).       Right knee: She exhibits decreased range of motion, swelling and effusion. Tenderness found. Medial joint line tenderness noted.       Legs: Neurological: She is alert and oriented to person, place, and time. She has normal reflexes. She displays normal reflexes. No cranial nerve deficit. She exhibits normal muscle tone. Coordination normal.  Skin: Skin is warm and dry.  Psychiatric: She has a normal mood and affect. Her behavior is normal. Judgment and thought content normal.     The right lower extremity is examined:  Inspection:  Thigh:  Non-tender and no defects  Knee has swelling 2+ effusion.                        Joint tenderness is present                        Patient is tender over the medial joint line  Lower Leg:  Has normal appearance and no tenderness or defects  Ankle:  Non-tender and no defects  Foot:  Non-tender and no defects Range of Motion:  Knee:  Range of motion is: 0 to 105                        Crepitus is  present  Ankle:  Range of motion is normal. Strength and Tone:  The right lower extremity has normal strength and tone. Stability:  Knee:  The knee is stable.  Ankle:  The ankle is stable.  The patient request injection, verbal consent was obtained.  The right knee was prepped appropriately after time out was performed.   Sterile technique was observed and injection of 1 cc of Depo-Medrol 40 mg with several cc's of plain xylocaine. Anesthesia was provided by ethyl chloride and a 20-gauge needle was used to inject the knee area. The injection was tolerated well.  A band aid dressing was applied.  The patient was advised to apply ice later today and tomorrow to the injection sight as needed.  I have told her she will need a MRI.   Her diabetes is under control.  She watches her blood sugars.  Her hypertension is under control.  She has no distal edema.  Encounter Diagnoses  Name Primary?  . Right knee pain   . Hip pain, right Yes        Assessment & Plan:  Return after MRI of the right knee.  Call if worse or any problem.

## 2015-09-20 ENCOUNTER — Telehealth: Payer: Self-pay | Admitting: *Deleted

## 2015-09-20 NOTE — Telephone Encounter (Signed)
[employer] PRE AUTH# [id number]  MRI 10/03/15 @ 7:45AM [facility]  FOLLOW UP 10/10/15 @ 2:50P  PATIENT AWARE

## 2015-10-03 ENCOUNTER — Ambulatory Visit (HOSPITAL_COMMUNITY)
Admission: RE | Admit: 2015-10-03 | Discharge: 2015-10-03 | Disposition: A | Discharge status: Home / Self Care | Payer: BLUE CROSS/BLUE SHIELD | Source: Ambulatory Visit | Attending: Orthopaedic Surgery | Admitting: Orthopaedic Surgery

## 2015-10-03 DIAGNOSIS — M25461 Effusion, right knee: Secondary | ICD-10-CM | POA: Diagnosis not present

## 2015-10-03 DIAGNOSIS — M1711 Unilateral primary osteoarthritis, right knee: Secondary | ICD-10-CM | POA: Insufficient documentation

## 2015-10-03 DIAGNOSIS — M25561 Pain in right knee: Secondary | ICD-10-CM | POA: Diagnosis present

## 2015-10-10 ENCOUNTER — Ambulatory Visit (INDEPENDENT_AMBULATORY_CARE_PROVIDER_SITE_OTHER): Payer: BLUE CROSS/BLUE SHIELD | Admitting: Orthopaedic Surgery

## 2015-10-10 VITALS — BP 107/64 | HR 73 | Temp 97.5°F | Ht 68.0 in | Wt 297.0 lb

## 2015-10-10 DIAGNOSIS — M25561 Pain in right knee: Secondary | ICD-10-CM | POA: Diagnosis not present

## 2015-10-10 MED ORDER — HYDROCODONE-ACETAMINOPHEN 7.5-325 MG PO TABS: 1.0000 | ORAL_TABLET | ORAL | Status: DC | PRN

## 2015-10-10 NOTE — Patient Instructions (Addendum)
SURGERY 10/18/15- RIGHT KNEE ARTHROSCOPY WITH LATERAL MENISECTOMY   Knee Arthroscopy Knee arthroscopy is a surgical procedure that is used to examine the inside of your knee joint and repair any damage. The surgeon puts a small, lighted instrument with a camera on the tip (arthroscope) through a small incision in your knee. The camera sends pictures to a monitor in the operating room. Your surgeon uses those pictures to guide the surgical instruments through other incisions to the area of damage. Knee arthroscopy can be used to treat many types of knee problems. It may be used:  To repair a torn ligament.  To repair or remove damaged tissue.  To remove a fluid-filled sac (cyst) from your knee. LET [facility] CARE PROVIDER KNOW ABOUT:  Any allergies you have.  All medicines you are taking, including vitamins, herbs, eye drops, creams, and over-the-counter medicines.  Previous problems you or members of your family have had with the use of anesthetics.  Any blood disorders you have.  Previous surgeries you have had.  Any medical conditions you may have. RISKS AND COMPLICATIONS Generally, this is a safe procedure. However, problems may occur, including:  Infection.  Bleeding.  Damage to blood vessels, nerves, or structures of your knee.  A blood clot that forms in your leg and travels to your lung.  Failure to relieve symptoms. BEFORE THE PROCEDURE  Ask your health care provider about:  Changing or stopping your regular medicines. This is especially important if you are taking diabetes medicines or blood thinners.  Taking medicines such as aspirin and ibuprofen. These medicines can thin your blood. Do not take these medicines before your procedure if your health care provider instructs you not to.  Follow your health care provider's instructions about eating or drinking restrictions.  Plan to have someone take you home after the procedure.  If you go home right after the  procedure, plan to have someone with you for 24 hours.  Do not drink alcohol unless your health care provider says that you can.  Do not use any tobacco products, including cigarettes, chewing tobacco, or electronic cigarettes unless your health care provider says that you can. If you need help quitting, ask your health care provider.  You may have a physical exam. PROCEDURE  An IV tube will be inserted into one of your veins.  You will be given one or more of the following:  A medicine that helps you relax (sedative).  A medicine that numbs the area (local anesthetic).  A medicine that makes you fall asleep (general anesthetic).  A medicine that is injected into your spine that numbs the area below and slightly above the injection site (spinal anesthetic).  A medicine that is injected into an area of your body that numbs everything below the injection site (regional anesthetic).  A cuff may be placed around your upper leg to slow bleeding during the procedure.  The surgeon will make a small number of incisions around your knee.  Your knee joint will be flushed and filled with a germ-free (sterile) solution.  The arthroscope will be passed through an incision into your knee joint.  More instruments will be passed through other incisions to repair your knee as needed.  The fluid will be removed from your knee.  The incisions will be closed with adhesive strips or stitches (sutures).  A bandage (dressing) will be placed over your knee. The procedure may vary among health care providers and hospitals. AFTER THE PROCEDURE  Your  blood pressure, heart rate, breathing rate and blood oxygen level will be monitored often until the medicines you were given have worn off.  You may be given medicine for pain.  You may get crutches to help you walk without using your knee to support your body weight.  You may have to wear compression stockings. These stocking help to prevent blood  clots and reduce swelling in your legs.   This information is not intended to replace advice given to you by your health care provider. Make sure you discuss any questions you have with your health care provider.   Document Released: 07/04/2000 Document Revised: 11/21/2014 Document Reviewed: 07/03/2014 Elsevier Interactive Patient Education [employer].

## 2015-10-10 NOTE — Progress Notes (Signed)
Patient ID: Meagan Reyes, female   DOB: February 12, 1956, 60 y.o.   MRN: [id number]  Chief Complaint  Patient presents with  . Results    MRI results Right knee    HPI twisted knee   ROS  BP 107/64 mmHg  Pulse 73  Temp(Src) 97.5 F (36.4 C)  Ht 5\' 8"  (1.727 m)  Wt 297 lb (134.718 kg)  BMI 45.17 kg/m2  Physical Exam  Constitutional: She is oriented to person, place, and time. She appears well-developed and well-nourished. No distress.  Cardiovascular: Normal rate and intact distal pulses.   Musculoskeletal:       Right knee: She exhibits effusion.  Neurological: She is alert and oriented to person, place, and time. She has normal reflexes. She exhibits normal muscle tone. Coordination normal.  Skin: Skin is warm and dry. No rash noted. She is not diaphoretic. No erythema. No pallor.  Psychiatric: She has a normal mood and affect. Her behavior is normal. Judgment and thought content normal.    Right Knee Exam   Tenderness  The patient is experiencing tenderness in the lateral joint line.  Range of Motion  Extension: normal   Tests  McMurray:  Lateral - positive Drawer:       Anterior - negative    Posterior - negative Varus: negative Valgus: negative  Other  Erythema: absent Sensation: normal Pulse: present Swelling: none Other tests: effusion present        ASSESSMENT AND PLAN   CLINICAL DATA:  Right knee pain since a fall at home 2 months ago.   EXAM: MRI OF THE RIGHT KNEE WITHOUT CONTRAST   TECHNIQUE: Multiplanar, multisequence MR imaging of the knee was performed. No intravenous contrast was administered.   COMPARISON:  Radiographs dated 09/18/2015   FINDINGS: MENISCI   Medial meniscus:  Normal.   Lateral meniscus: There is a subtle oblique horizontal tear of the midbody best seen on images 5 and 6 of series 8 with extensive intrinsic degeneration of the anterior horn with an intrameniscal cyst at the anterior attachment.   LIGAMENTS     Cruciates:  Normal.   Collaterals:  Normal.   CARTILAGE   Patellofemoral:  Normal.   Medial:  Normal.   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   Joint: Moderate joint effusion. Multiple small loose bodies in the posterior central aspect of the joint.   Popliteal Fossa:  6 x 3 cm Baker's cyst.   Extensor Mechanism:  Normal.   Bones:  The extra-articular bones are normal.   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   Moderate joint effusion with multiple loose bodies in the joint.     Electronically Signed   By: Lorriane Shire M.D.   On: 10/03/2015 09:27  I discussed the surgery with [name] and gave her the following info :   Plan SARK lat menisectomy    Meniscus Injury, Arthroscopy Arthroscopy is a surgical procedure that involves the use of a small scope that has a camera and surgical instruments on the end (arthroscope). An arthroscope can be used to repair your meniscus injury.  LET [facility] CARE PROVIDER KNOW ABOUT:  Any allergies you have.  All medicines you are taking, including vitamins, herbs, eyedrops, creams, and over-the-counter medicines.  Any recent colds or infections you  have had or currently have.  Previous problems you or members of your family have had with the use of anesthetics.  Any blood disorders or blood clotting problems you have.  Previous surgeries you have had.  Medical conditions you have. RISKS AND COMPLICATIONS Generally, this is a safe procedure. However, as with any procedure, problems can occur. Possible problems include:  Damage to nerves or blood vessels.  Excess bleeding.  Blood clots.  Infection. BEFORE THE PROCEDURE  Do not eat or drink for 6-8 hours before the  procedure.  Take medicines as directed by your surgeon. Ask your surgeon about changing or stopping your regular medicines.  You may have lab tests the morning of surgery. PROCEDURE  You will be given one of the following:   A medicine that numbs the area (local anesthesia).  A medicine that makes you go to sleep (general anesthesia).  A medicine injected into your spine that numbs your body below the waist (spinal anesthesia). Most often, several small cuts (incisions) are made in the knee. The arthroscope and instruments go into the incisions to repair the damage. The torn portion of the meniscus is removed.  During this time, your surgeon may find a partial or complete tear in a cruciate ligament, such as the anterior cruciate ligament (ACL). A completely torn cruciate ligament is reconstructed by taking tissue from another part of the body (grafting) and placing it into the injured area. This requires several larger incisions to complete the repair. Sometimes, open surgery is needed for collateral ligament injuries. If a collateral ligament is found to be injured, your surgeon may staple or suture the tear through a slightly larger incision on the side of the knee. AFTER THE PROCEDURE You will be taken to the recovery area where your progress will be monitored. When you are awake, stable, and taking fluids without complications, you will be allowed to go home. This is usually the same day. However, more extensive repairs of a ligament may require an overnight stay.  The recovery time after repairing your meniscus or ligament depends on the amount of damage to these structures. It also depends on whether or not reconstructive knee surgery was needed.   A torn or stretched ligament (ligament sprain) may take 6-8 weeks to heal.   It takes about the 4-6 WEEKS if your surgeon removed a torn meniscus.  A repaired meniscus may require 6-12 weeks of recovery time.  A torn ligament needing  reconstructive surgery may take 6-12 months to heal fully.   This information is not intended to replace advice given to you by your health care provider. Make sure you discuss any questions you have with your health care provider. You have decided to proceed with operative arthroscopy of the knee. You have decided not to continue with nonoperative measures such as but not limited to oral medication, weight loss, activity modification, physical therapy, bracing, or injection.  We will perform operative arthroscopy of the knee. Some of the risks associated with arthroscopic surgery of the knee include but are not limited to Bleeding Infection Swelling Stiffness Blood clot Pain  If you're not comfortable with these risks and would like to continue with nonoperative treatment please let Dr. Aline Brochure know prior to your surgery.   Document Released: 07/04/2000 Document Revised: 07/12/2013 Document Reviewed: 12/03/2012 Elsevier Interactive Patient Education 2016 [place].   Dr Aline Brochure s note

## 2015-10-10 NOTE — Progress Notes (Signed)
Patient EV:OJJKK Meagan Reyes, female DOB:Nov 24, 1955, 60 y.o. [id number]  Chief Complaint  Patient presents with  . Results    MRI results Right knee    HPI  Meagan Reyes is a 60 y.o. female who returns for review of MRI results of the right knee.  Her right knee is not better.  She has swelling and pain.  She has popping.  She has posterior lateral pain at night.  She has giving way.    The MRI shows:  IMPRESSION: Arthritic changes of the lateral compartment with degeneration of the midbody and anterior horn of the lateral meniscus with a focal tear in the midbody.  Moderate joint effusion with multiple loose bodies in the joint.  I have explained the findings to her.  I have recommended arthroscopy of the knee.  I have explained what this is.  I do not do surgery anymore and I have asked that she see Dr. Aline Reyes of this office for surgery.  He is in the office right now and then he met with her. HPI  Body mass index is 45.17 kg/(m^2).  Review of Systems  Constitutional: Positive for fatigue.       She has diabetes. She has GERD She has hypertension She has had major foot surgery on the left in the past.  HENT: Negative for congestion.   Respiratory: Negative for shortness of breath.   Cardiovascular: Positive for chest pain.  Endocrine: Positive for cold intolerance.  Musculoskeletal: Positive for myalgias, joint swelling, arthralgias and gait problem.  Neurological: Negative for numbness.    Past Medical History  Diagnosis Date  . Hypertension   . Hyperlipemia     Past Surgical History  Procedure Laterality Date  . Abdominal hysterectomy    . Heel spur excision    . Back surgery    . Tonsillectomy      No family history on file.  Social History Social History  Substance Use Topics  . Smoking status: Never Smoker   . Smokeless tobacco: Not on file  . Alcohol Use: No    Allergies  Allergen Reactions  . Neurontin [Gabapentin] Rash    Current  Outpatient Prescriptions  Medication Sig Dispense Refill  . aspirin 81 MG chewable tablet Chew 81 mg by mouth every other day. Takes in the evening    . CALCIUM PO Take 1 tablet by mouth daily.    . cyclobenzaprine (FLEXERIL) 10 MG tablet Take 1 tablet (10 mg total) by mouth 3 (three) times daily as needed. 21 tablet 0  . lisinopril-hydrochlorothiazide (PRINZIDE,ZESTORETIC) 20-12.5 MG per tablet Take 2 tablets by mouth daily.    . meloxicam (MOBIC) 15 MG tablet Take 15 mg by mouth daily.    . metFORMIN (GLUCOPHAGE) 500 MG tablet Take 500 mg by mouth 2 (two) times daily. Reported on 09/18/2015    . Multiple Vitamins-Minerals (WOMENS DAILY FORMULA PO) Take 1 tablet by mouth daily.    . Omega-3 Fatty Acids (FISH OIL) 1000 MG CAPS Take 2,000 capsules by mouth 2 (two) times daily.    . ranitidine (ZANTAC) 300 MG tablet Take 300 mg by mouth at bedtime.     Marland Kitchen HYDROcodone-acetaminophen (NORCO) 7.5-325 MG tablet Take 1 tablet by mouth every 4 (four) hours as needed for moderate pain (Must last 14 days.Do not drive or operate machinery while taking this medicine.). 60 tablet 0   No current facility-administered medications for this visit.     Physical Exam  Blood pressure 107/64, pulse 73, temperature 97.5  F (36.4 C), height 5' 8" (1.727 m), weight 297 lb (134.718 kg).  Constitutional: overall normal hygiene, normal nutrition, well developed, normal grooming, normal body habitus. Assistive device:none  Musculoskeletal: gait and station Limp right, muscle tone and strength are normal, no tremors or atrophy is present.  .  Neurological: coordination overall normal.  Deep tendon reflex/nerve stretch intact.  Sensation normal.  Cranial nerves II-XII intact.   Skin:   normal overall no scars, lesions, ulcers or rashes. No psoriasis.  Psychiatric: Alert and oriented x 3.  Recent memory intact, remote memory unclear.  Normal mood and affect. Well groomed.  Good eye contact.  Cardiovascular: overall  no swelling, no varicosities, no edema bilaterally, normal temperatures of the legs and arms, no clubbing, cyanosis and good capillary refill.  Lymphatic: palpation is normal.  The right lower extremity is examined:  Inspection:  Thigh:  Non-tender and no defects  Knee has swelling 1+ effusion.                        Joint tenderness is present                        Patient is not tender over the medial joint line but is tender over the lateral joint line  Lower Leg:  Has normal appearance and no tenderness or defects  Ankle:  Non-tender and no defects  Foot:  Non-tender and no defects Range of Motion:  Knee:  Range of motion is: 0-105                        Crepitus is  present  Ankle:  Range of motion is normal. Strength and Tone:  The left and right lower extremity has normal strength and tone. Stability:  Knee:  The knee has positive lateral McMurray.  Ankle:  The ankle is stable.  Rx given for pain.  Her hypertension is well controlled.  She also has lower back pain treated by chiropractor who has given her exercises to do.  The patient has been educated about the nature of the problem(s) and counseled on treatment options.  The patient appeared to understand what I have discussed and is in agreement with it.  Encounter Diagnosis  Name Primary?  . Right knee pain Yes    PLAN Call if any problems.  Precautions discussed.  Continue current medications.   Return to clinic To see Dr. Aline Reyes for evaluation for surgery of the knee on the right.

## 2015-10-11 ENCOUNTER — Other Ambulatory Visit: Payer: Self-pay | Admitting: *Deleted

## 2015-10-12 NOTE — Patient Instructions (Signed)
Meagan Reyes  10/12/2015     @PREFPERIOPPHARMACY @   Your procedure is scheduled on  10/18/2015   Report to [facility] at  1100  A.M.  Call this number if you have problems the morning of surgery:  [phone]   Remember:  Do not eat food or drink liquids after midnight.  Take these medicines the morning of surgery with A SIP OF WATER  Flexaril, hydrocodone, mobic, lisinopril, zantac.   Do not wear jewelry, make-up or nail polish.  Do not wear lotions, powders, or perfumes.  You may wear deodorant.  Do not shave 48 hours prior to surgery.  Men may shave face and neck.  Do not bring valuables to the hospital.  [facility] is not responsible for any belongings or valuables.  Contacts, dentures or bridgework may not be worn into surgery.  Leave your suitcase in the car.  After surgery it may be brought to your room.  For patients admitted to the hospital, discharge time will be determined by your treatment team.  Patients discharged the day of surgery will not be allowed to drive home.   Name and phone number of your driver:   family Special instructions:  none  Please read over the following fact sheets that you were given. Coughing and Deep Breathing, Surgical Site Infection Prevention, Anesthesia Post-op Instructions and Care and Recovery After Surgery      Meniscus Injury, Arthroscopy Arthroscopy is a surgical procedure that involves the use of a small scope that has a camera and surgical instruments on the end (arthroscope). An arthroscope can be used to repair your meniscus injury.  LET [facility] CARE PROVIDER KNOW ABOUT:  Any allergies you have.  All medicines you are taking, including vitamins, herbs, eyedrops, creams, and over-the-counter medicines.  Any recent colds or infections you have had or currently have.  Previous problems you or members of your family have had with the use of anesthetics.  Any blood disorders or blood clotting problems  you have.  Previous surgeries you have had.  Medical conditions you have. RISKS AND COMPLICATIONS Generally, this is a safe procedure. However, as with any procedure, problems can occur. Possible problems include:  Damage to nerves or blood vessels.  Excess bleeding.  Blood clots.  Infection. BEFORE THE PROCEDURE  Do not eat or drink for 6-8 hours before the procedure.  Take medicines as directed by your surgeon. Ask your surgeon about changing or stopping your regular medicines.  You may have lab tests the morning of surgery. PROCEDURE  You will be given one of the following:   A medicine that numbs the area (local anesthesia).  A medicine that makes you go to sleep (general anesthesia).  A medicine injected into your spine that numbs your body below the waist (spinal anesthesia). Most often, several small cuts (incisions) are made in the knee. The arthroscope and instruments go into the incisions to repair the damage. The torn portion of the meniscus is removed.  During this time, your surgeon may find a partial or complete tear in a cruciate ligament, such as the anterior cruciate ligament (ACL). A completely torn cruciate ligament is reconstructed by taking tissue from another part of the body (grafting) and placing it into the injured area. This requires several larger incisions to complete the repair. Sometimes, open surgery is needed for collateral ligament injuries. If a collateral ligament is found to be injured, your surgeon may staple or  suture the tear through a slightly larger incision on the side of the knee. AFTER THE PROCEDURE You will be taken to the recovery area where your progress will be monitored. When you are awake, stable, and taking fluids without complications, you will be allowed to go home. This is usually the same day. However, more extensive repairs of a ligament may require an overnight stay.  The recovery time after repairing your meniscus or ligament  depends on the amount of damage to these structures. It also depends on whether or not reconstructive knee surgery was needed.   A torn or stretched ligament (ligament sprain) may take 6-8 weeks to heal. It takes about the same amount of time if your surgeon removed a torn meniscus.  A repaired meniscus may require 6-12 weeks of recovery time.  A torn ligament needing reconstructive surgery may take 6-12 months to heal fully.   This information is not intended to replace advice given to you by your health care provider. Make sure you discuss any questions you have with your health care provider.   Document Released: 07/04/2000 Document Revised: 07/12/2013 Document Reviewed: 12/03/2012 Elsevier Interactive Patient Education 2016 [employer] por lesin de meniscos - Cuidados posteriores (Arthroscopy, With Meniscus Injury, Care After) Siga estas instrucciones durante las prximas semanas. Estas indicaciones le proporcionan informacin general acerca de cmo deber cuidarse despus del procedimiento. El mdico tambin podr darle instrucciones ms especficas. El tratamiento se ha planificado de acuerdo a las prcticas mdicas actuales, pero a veces se producen problemas. Comunquese con el mdico si tiene algn problema o tiene dudas despus del procedimiento. QU ESPERAR DESPUS DEL PROCEDIMIENTO Despus del procedimiento, es tpico tener las siguientes sensaciones:  Dolor e hinchazn en la rodilla.  Estreimiento.  Dificultad para caminar. INSTRUCCIONES PARA EL CUIDADO EN EL HOGAR   Use las muletas y [employer] ejercicios de las rodillas segn las indicaciones de su mdico.  Aplique hielo sobre la zona lesionada.  Ponga el hielo en una bolsa plstica.  Colquese una toalla entre la piel y la bolsa de hielo.  Deje el hielo durante 15 - 20 minutos y aplquelo 3 - 4 veces por Training and development officer. [employer].  Haga reposo y levante (eleve) la rodilla.  Cambie  los apsitos (vendajes) tal como le indic su mdico.  Mantenga la herida limpia y seca. Lave suavemente la herida con agua y [place]. Toque suavemente la herida para secarla. Puede tomar una ducha luego de 24 a 48 horas de transcurrida la [place].No tome baos, no utilice piscinas ni baeras durante 14 das, o segn las indicaciones del mdico.  Slo tome medicamentos de venta libre o recetados para Glass blower/designer, el Tree surgeon o bajar la fiebre, segn las indicaciones de su mdico.  Siga su dieta normal segn las indicaciones de su mdico.  No levante objetos que pesen ms de 10 libras (4.5 kg) ni practique deportes de contacto durante 3 semanas, o segn las indicaciones.  Si le colocaron un aparato ortopdico, selo segn las indicaciones de su mdico.  El profesional que lo asiste lo ayudar con indicaciones para la rehabilitacin de la rodilla. SOLICITE ATENCIN MDICA SI:   Aumenta el sangrado en el lugar de la herida (ms de una pequea [place]).  Presenta enrojecimiento, hinchazn o aumento del dolor en la herida.  Observa una secrecin de color blanco amarillento (pus) en la herida. SOLICITE ATENCIN MDICA DE INMEDIATO SI:   Aparece una erupcin cutnea.  Tiene fiebre o sntomas  persistentes durante ms de 2 - 3 das.  Tiene dificultad para respirar.  Siente dolor cada vez ms intenso al [employer] rodilla. ASEGRESE DE QUE:   Comprende estas instrucciones.  Controlar su afeccin.  Recibir ayuda de inmediato si no mejora o si empeora.   Esta informacin no tiene Marine scientist el consejo del mdico. Asegrese de hacerle al mdico cualquier pregunta que tenga.   Document Released: 03/09/2013 Elsevier Interactive Patient Education 2016 [employer]. PATIENT INSTRUCTIONS POST-ANESTHESIA  IMMEDIATELY FOLLOWING SURGERY:  Do not drive or operate machinery for the first twenty four hours after surgery.  Do not make any important decisions for twenty four hours after  surgery or while taking narcotic pain medications or sedatives.  If you develop intractable nausea and vomiting or a severe headache please notify your doctor immediately.  FOLLOW-UP:  Please make an appointment with your surgeon as instructed. You do not need to follow up with anesthesia unless specifically instructed to do so.  WOUND CARE INSTRUCTIONS (if applicable):  Keep a dry clean dressing on the anesthesia/puncture wound site if there is drainage.  Once the wound has quit draining you may leave it open to air.  Generally you should leave the bandage intact for twenty four hours unless there is drainage.  If the epidural site drains for more than 36-48 hours please call the anesthesia department.  QUESTIONS?:  Please feel free to call your physician or the hospital operator if you have any questions, and they will be happy to assist you.

## 2015-10-15 ENCOUNTER — Encounter (HOSPITAL_COMMUNITY)
Admission: RE | Admit: 2015-10-15 | Discharge: 2015-10-15 | Disposition: A | Discharge status: Home / Self Care | Payer: BLUE CROSS/BLUE SHIELD | Source: Ambulatory Visit | Attending: Orthopedic Surgery | Admitting: Orthopedic Surgery

## 2015-10-15 ENCOUNTER — Telehealth: Payer: Self-pay | Admitting: Orthopedic Surgery

## 2015-10-15 NOTE — Telephone Encounter (Signed)
Regarding out-patient surgery scheduled at [facility] 10/17/15, CPT 29881, 29880, contacted [place]; per Audrie Gallus, no pre-authorization is required, although must meet medical criteria and guidelines; her name and today's date for reference, 10/15/15, 4:48p.m.

## 2015-10-15 NOTE — Patient Instructions (Addendum)
Meagan Reyes  10/15/2015     @PREFPERIOPPHARMACY @   Your procedure is scheduled on 10/18/2015.  Report to [facility] at 11:00 A.M.  Call this number if you have problems the morning of surgery:  [phone]   Remember:  Do not eat food or drink liquids after midnight.  Take these medicines the morning of surgery with A SIP OF WATER Flexeril, Hydrocodone, Mobic, Zantac and Lisinopril   Do not wear jewelry, make-up or nail polish.  Do not wear lotions, powders, or perfumes.  You may wear deodorant.  Do not shave 48 hours prior to surgery.  Men may shave face and neck.  Do not bring valuables to the hospital.  [facility] is not responsible for any belongings or valuables.  Contacts, dentures or bridgework may not be worn into surgery.  Leave your suitcase in the car.  After surgery it may be brought to your room.  For patients admitted to the hospital, discharge time will be determined by your treatment team.  Patients discharged the day of surgery will not be allowed to drive home.    Please read over the following fact sheets that you were given. Surgical Site Infection Prevention and Anesthesia Post-op Instructions     PATIENT INSTRUCTIONS POST-ANESTHESIA  IMMEDIATELY FOLLOWING SURGERY:  Do not drive or operate machinery for the first twenty four hours after surgery.  Do not make any important decisions for twenty four hours after surgery or while taking narcotic pain medications or sedatives.  If you develop intractable nausea and vomiting or a severe headache please notify your doctor immediately.  FOLLOW-UP:  Please make an appointment with your surgeon as instructed. You do not need to follow up with anesthesia unless specifically instructed to do so.  WOUND CARE INSTRUCTIONS (if applicable):  Keep a dry clean dressing on the anesthesia/puncture wound site if there is drainage.  Once the wound has quit draining you may leave it open to air.  Generally you should leave  the bandage intact for twenty four hours unless there is drainage.  If the epidural site drains for more than 36-48 hours please call the anesthesia department.  QUESTIONS?:  Please feel free to call your physician or the hospital operator if you have any questions, and they will be happy to assist you.      Knee Arthroscopy Knee arthroscopy is a surgical procedure that is used to examine the inside of your knee joint and repair any damage. The surgeon puts a small, lighted instrument with a camera on the tip (arthroscope) through a small incision in your knee. The camera sends pictures to a monitor in the operating room. Your surgeon uses those pictures to guide the surgical instruments through other incisions to the area of damage. Knee arthroscopy can be used to treat many types of knee problems. It may be used:  To repair a torn ligament.  To repair or remove damaged tissue.  To remove a fluid-filled sac (cyst) from your knee. LET [facility] CARE PROVIDER KNOW ABOUT:  Any allergies you have.  All medicines you are taking, including vitamins, herbs, eye drops, creams, and over-the-counter medicines.  Previous problems you or members of your family have had with the use of anesthetics.  Any blood disorders you have.  Previous surgeries you have had.  Any medical conditions you may have. RISKS AND COMPLICATIONS Generally, this is a safe procedure. However, problems may occur, including:  Infection.  Bleeding.  Damage to blood vessels, nerves,  or structures of your knee.  A blood clot that forms in your leg and travels to your lung.  Failure to relieve symptoms. BEFORE THE PROCEDURE  Ask your health care provider about:  Changing or stopping your regular medicines. This is especially important if you are taking diabetes medicines or blood thinners.  Taking medicines such as aspirin and ibuprofen. These medicines can thin your blood. Do not take these medicines before  your procedure if your health care provider instructs you not to.  Follow your health care provider's instructions about eating or drinking restrictions.  Plan to have someone take you home after the procedure.  If you go home right after the procedure, plan to have someone with you for 24 hours.  Do not drink alcohol unless your health care provider says that you can.  Do not use any tobacco products, including cigarettes, chewing tobacco, or electronic cigarettes unless your health care provider says that you can. If you need help quitting, ask your health care provider.  You may have a physical exam. PROCEDURE  An IV tube will be inserted into one of your veins.  You will be given one or more of the following:  A medicine that helps you relax (sedative).  A medicine that numbs the area (local anesthetic).  A medicine that makes you fall asleep (general anesthetic).  A medicine that is injected into your spine that numbs the area below and slightly above the injection site (spinal anesthetic).  A medicine that is injected into an area of your body that numbs everything below the injection site (regional anesthetic).  A cuff may be placed around your upper leg to slow bleeding during the procedure.  The surgeon will make a small number of incisions around your knee.  Your knee joint will be flushed and filled with a germ-free (sterile) solution.  The arthroscope will be passed through an incision into your knee joint.  More instruments will be passed through other incisions to repair your knee as needed.  The fluid will be removed from your knee.  The incisions will be closed with adhesive strips or stitches (sutures).  A bandage (dressing) will be placed over your knee. The procedure may vary among health care providers and hospitals. AFTER THE PROCEDURE  Your blood pressure, heart rate, breathing rate and blood oxygen level will be monitored often until the medicines  you were given have worn off.  You may be given medicine for pain.  You may get crutches to help you walk without using your knee to support your body weight.  You may have to wear compression stockings. These stocking help to prevent blood clots and reduce swelling in your legs.   This information is not intended to replace advice given to you by your health care provider. Make sure you discuss any questions you have with your health care provider.   Document Released: 07/04/2000 Document Revised: 11/21/2014 Document Reviewed: 07/03/2014 Elsevier Interactive Patient Education [employer].

## 2015-10-16 ENCOUNTER — Other Ambulatory Visit: Payer: Self-pay

## 2015-10-16 ENCOUNTER — Telehealth: Payer: Self-pay | Admitting: Orthopedic Surgery

## 2015-10-16 ENCOUNTER — Encounter (HOSPITAL_COMMUNITY)
Admission: RE | Admit: 2015-10-16 | Discharge: 2015-10-16 | Disposition: A | Discharge status: Home / Self Care | Payer: BLUE CROSS/BLUE SHIELD | Source: Ambulatory Visit | Attending: Orthopedic Surgery | Admitting: Orthopedic Surgery

## 2015-10-16 ENCOUNTER — Encounter (HOSPITAL_COMMUNITY): Payer: Self-pay

## 2015-10-16 DIAGNOSIS — K219 Gastro-esophageal reflux disease without esophagitis: Secondary | ICD-10-CM | POA: Diagnosis not present

## 2015-10-16 DIAGNOSIS — Z7984 Long term (current) use of oral hypoglycemic drugs: Secondary | ICD-10-CM | POA: Diagnosis not present

## 2015-10-16 DIAGNOSIS — Z79899 Other long term (current) drug therapy: Secondary | ICD-10-CM | POA: Diagnosis not present

## 2015-10-16 DIAGNOSIS — M1711 Unilateral primary osteoarthritis, right knee: Secondary | ICD-10-CM | POA: Diagnosis not present

## 2015-10-16 DIAGNOSIS — Z7982 Long term (current) use of aspirin: Secondary | ICD-10-CM | POA: Diagnosis not present

## 2015-10-16 DIAGNOSIS — I10 Essential (primary) hypertension: Secondary | ICD-10-CM | POA: Diagnosis not present

## 2015-10-16 DIAGNOSIS — Z888 Allergy status to other drugs, medicaments and biological substances status: Secondary | ICD-10-CM | POA: Diagnosis not present

## 2015-10-16 DIAGNOSIS — S83281A Other tear of lateral meniscus, current injury, right knee, initial encounter: Secondary | ICD-10-CM | POA: Diagnosis not present

## 2015-10-16 DIAGNOSIS — X58XXXA Exposure to other specified factors, initial encounter: Secondary | ICD-10-CM | POA: Diagnosis not present

## 2015-10-16 DIAGNOSIS — Z90710 Acquired absence of both cervix and uterus: Secondary | ICD-10-CM | POA: Diagnosis not present

## 2015-10-16 DIAGNOSIS — M94261 Chondromalacia, right knee: Secondary | ICD-10-CM | POA: Diagnosis not present

## 2015-10-16 DIAGNOSIS — M25461 Effusion, right knee: Secondary | ICD-10-CM | POA: Diagnosis not present

## 2015-10-16 DIAGNOSIS — M23200 Derangement of unspecified lateral meniscus due to old tear or injury, right knee: Secondary | ICD-10-CM | POA: Diagnosis present

## 2015-10-16 DIAGNOSIS — E119 Type 2 diabetes mellitus without complications: Secondary | ICD-10-CM | POA: Diagnosis not present

## 2015-10-16 DIAGNOSIS — E785 Hyperlipidemia, unspecified: Secondary | ICD-10-CM | POA: Diagnosis not present

## 2015-10-16 LAB — BASIC METABOLIC PANEL
Anion gap: 8 (ref 5–15)
BUN: 23 mg/dL — ABNORMAL HIGH (ref 6–20)
CALCIUM: 8.6 mg/dL — AB (ref 8.9–10.3)
CO2: 27 mmol/L (ref 22–32)
Chloride: 102 mmol/L (ref 101–111)
Creatinine, Ser: 0.74 mg/dL (ref 0.44–1.00)
GFR calc Af Amer: >60 mL/min (ref >60)
GLUCOSE: 87 mg/dL (ref 65–99)
Potassium: 4 mmol/L (ref 3.5–5.1)
Sodium: 137 mmol/L (ref 135–145)

## 2015-10-16 LAB — CBC WITH DIFFERENTIAL/PLATELET
BASOS ABS: 0 10*3/uL (ref 0.0–0.1)
BASOS PCT: 0 %
Eosinophils Absolute: 0.2 10*3/uL (ref 0.0–0.7)
Eosinophils Relative: 2 %
HEMATOCRIT: 37.6 % (ref 36.0–46.0)
Hemoglobin: 12.4 g/dL (ref 12.0–15.0)
Lymphocytes Relative: 31 %
Lymphs Abs: 3.1 10*3/uL (ref 0.7–4.0)
MCH: 28.5 pg (ref 26.0–34.0)
MCHC: 33 g/dL (ref 30.0–36.0)
MCV: 86.4 fL (ref 78.0–100.0)
MONO ABS: 0.6 10*3/uL (ref 0.1–1.0)
Monocytes Relative: 6 %
NEUTROS ABS: 6 10*3/uL (ref 1.7–7.7)
Neutrophils Relative %: 61 %
PLATELETS: 232 10*3/uL (ref 150–400)
RBC: 4.35 MIL/uL (ref 3.87–5.11)
RDW: 14.1 % (ref 11.5–15.5)
WBC: 9.9 10*3/uL (ref 4.0–10.5)

## 2015-10-16 NOTE — Telephone Encounter (Signed)
Advised patient that iv antibiotic will be given during procedure

## 2015-10-16 NOTE — Telephone Encounter (Signed)
Patient called wanted to know if she needed to be on an antibiotic before the surgery.  I told her that I was unsure that I would be happy to check this for her.  She said that it was mentioned about her getting on an antibiotic the day before her surgery while she was here on her last office visit with Dr. Aline Brochure.

## 2015-10-17 NOTE — Addendum Note (Signed)
Addended by: Willette Pa on: 10/17/2015 09:28 PM   Modules accepted: Miquel Dunn

## 2015-10-17 NOTE — H&P (Signed)
Patient Meagan Reyes:[id number] Groah, female DOB:Jan 04, 1956, 60 y.o. [id number]    Chief Complaint   Patient presents with   .  Results       MRI results Right knee     HPI The history and physical noted below has been confirmed by independent evaluation by operating physician  Meagan Reyes is a 60 y.o. female who returns for review of MRI results of the right knee.  Her right knee is not better.  She has swelling and pain.  She has popping.  She has posterior lateral pain at night.  She has giving way.    The MRI shows:  IMPRESSION: Arthritic changes of the lateral compartment with degeneration of the midbody and anterior horn of the lateral meniscus with a focal tear in the midbody.   Moderate joint effusion with multiple loose bodies in the joint.  Body mass index is 45.17 kg/(m^2).  Review of Systems  Constitutional: Positive for fatigue.       She has diabetes. She has GERD She has hypertension She has had major foot surgery on the left in the past.  HENT: Negative for congestion.   Respiratory: Negative for shortness of breath.   Cardiovascular: Positive for chest pain.  Endocrine: Positive for cold intolerance.  Musculoskeletal: Positive for myalgias, joint swelling, arthralgias and gait problem.  Neurological: Negative for numbness.      Past Medical History   Diagnosis  Date   .  Hypertension     .  Hyperlipemia         Past Surgical History   Procedure  Laterality  Date   .  Abdominal hysterectomy       .  Heel spur excision       .  Back surgery       .  Tonsillectomy         No family history reported  Social History Social History   Substance Use Topics   .  Smoking status:  Never Smoker    .  Smokeless tobacco:  Not on file   .  Alcohol Use:  No       Allergies   Allergen  Reactions   .  Neurontin [Gabapentin]  Rash       Current Outpatient Prescriptions   Medication  Sig  Dispense  Refill   .  aspirin 81 MG chewable tablet  Chew 81 mg by mouth  every other day. Takes in the evening       .  CALCIUM PO  Take 1 tablet by mouth daily.       .  cyclobenzaprine (FLEXERIL) 10 MG tablet  Take 1 tablet (10 mg total) by mouth 3 (three) times daily as needed.  21 tablet  0   .  lisinopril-hydrochlorothiazide (PRINZIDE,ZESTORETIC) 20-12.5 MG per tablet  Take 2 tablets by mouth daily.       .  meloxicam (MOBIC) 15 MG tablet  Take 15 mg by mouth daily.       .  metFORMIN (GLUCOPHAGE) 500 MG tablet  Take 500 mg by mouth 2 (two) times daily. Reported on 09/18/2015       .  Multiple Vitamins-Minerals (WOMENS DAILY FORMULA PO)  Take 1 tablet by mouth daily.       .  Omega-3 Fatty Acids (FISH OIL) 1000 MG CAPS  Take 2,000 capsules by mouth 2 (two) times daily.       .  ranitidine (ZANTAC) 300 MG tablet  Take 300 mg by  mouth at bedtime.        Marland Kitchen  HYDROcodone-acetaminophen (NORCO) 7.5-325 MG tablet  Take 1 tablet by mouth every 4 (four) hours as needed for moderate pain (Must last 14 days.  Do not drive or operate machinery while taking this medicine.).  60 tablet  0      No current facility-administered medications for this visit.      Physical Exam  Blood pressure 107/64, pulse 73, temperature 97.5 F (36.4 C), height 5\' 8"  (1.727 m), weight 297 lb (134.718 kg).  Constitutional: overall normal hygiene, normal nutrition, well developed, normal grooming, normal body habitus. Assistive device:none  Musculoskeletal: gait and station Limp right, muscle tone and strength are normal, no tremors or atrophy is present.   .   Neurological: coordination overall normal.  Deep tendon reflex/nerve stretch intact.  Sensation normal.  Cranial nerves II-XII intact.   Skin:   normal overall no scars, lesions, ulcers or rashes. No psoriasis.  Psychiatric: Alert and oriented x 3.  Recent memory intact, remote memory unclear.  Normal mood and affect. Well groomed.  Good eye contact.  Cardiovascular: overall no swelling, no varicosities, no edema bilaterally, normal  temperatures of the legs and arms, no clubbing, cyanosis and good capillary refill.  Lymphatic: palpation is normal.  The right lower extremity is examined:  Inspection:             Thigh:  Non-tender and no defects             Knee has swelling 1+ effusion.                        Joint tenderness is present                        Patient is not tender over the medial joint line but is tender over the lateral joint line             Lower Leg:  Has normal appearance and no tenderness or defects             Ankle:  Non-tender and no defects             Foot:  Non-tender and no defects Range of Motion:             Knee:  Range of motion is: 0-105                        Crepitus is  present             Ankle:  Range of motion is normal. Strength and Tone:             The left and right lower extremity has normal strength and tone. Stability:             Knee:  The knee has positive lateral McMurray.             Ankle:  The ankle is stable.   CLINICAL DATA:  Right knee pain since a fall at home 2 months ago.   EXAM: MRI OF THE RIGHT KNEE WITHOUT CONTRAST   TECHNIQUE: Multiplanar, multisequence MR imaging of the knee was performed. No intravenous contrast was administered.   COMPARISON:  Radiographs dated 09/18/2015   FINDINGS: MENISCI   Medial meniscus:  Normal.   Lateral meniscus: There is a subtle oblique horizontal tear of the midbody best seen on  images 5 and 6 of series 8 with extensive intrinsic degeneration of the anterior horn with an intrameniscal cyst at the anterior attachment.   LIGAMENTS   Cruciates:  Normal.   Collaterals:  Normal.   CARTILAGE   Patellofemoral:  Normal.   Medial:  Normal.   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   Joint: Moderate joint effusion. Multiple small loose bodies in the posterior central aspect of the joint.    Popliteal Fossa:  6 x 3 cm Baker's cyst.   Extensor Mechanism:  Normal.   Bones:  The extra-articular bones are normal.   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   Moderate joint effusion with multiple loose bodies in the joint.     Electronically Signed   By: Lorriane Shire M.D.   On: 10/03/2015 09:27   Torn lateral meniscus of the right knee  Plan for arthroscopy right knee partial lateral meniscectomy patient understands she will have some residual pain from lateral compartment arthrosis   This procedure has been fully reviewed with the patient and written informed consent has been obtained.

## 2015-10-18 ENCOUNTER — Ambulatory Visit (HOSPITAL_COMMUNITY)
Admission: RE | Admit: 2015-10-18 | Discharge: 2015-10-18 | Disposition: A | Discharge status: Home / Self Care | Payer: BLUE CROSS/BLUE SHIELD | Source: Ambulatory Visit | Attending: Orthopedic Surgery | Admitting: Orthopedic Surgery

## 2015-10-18 ENCOUNTER — Ambulatory Visit (HOSPITAL_COMMUNITY): Payer: BLUE CROSS/BLUE SHIELD | Admitting: Anesthesiology

## 2015-10-18 ENCOUNTER — Encounter (HOSPITAL_COMMUNITY)
Admission: RE | Disposition: A | Discharge status: Home / Self Care | Payer: Self-pay | Source: Ambulatory Visit | Attending: Orthopedic Surgery

## 2015-10-18 ENCOUNTER — Encounter (HOSPITAL_COMMUNITY): Payer: Self-pay | Admitting: Anesthesiology

## 2015-10-18 DIAGNOSIS — M2241 Chondromalacia patellae, right knee: Secondary | ICD-10-CM | POA: Diagnosis not present

## 2015-10-18 DIAGNOSIS — M25461 Effusion, right knee: Secondary | ICD-10-CM | POA: Insufficient documentation

## 2015-10-18 DIAGNOSIS — I10 Essential (primary) hypertension: Secondary | ICD-10-CM | POA: Insufficient documentation

## 2015-10-18 DIAGNOSIS — Z888 Allergy status to other drugs, medicaments and biological substances status: Secondary | ICD-10-CM | POA: Insufficient documentation

## 2015-10-18 DIAGNOSIS — M129 Arthropathy, unspecified: Secondary | ICD-10-CM

## 2015-10-18 DIAGNOSIS — Z9889 Other specified postprocedural states: Secondary | ICD-10-CM

## 2015-10-18 DIAGNOSIS — M94261 Chondromalacia, right knee: Secondary | ICD-10-CM | POA: Insufficient documentation

## 2015-10-18 DIAGNOSIS — K219 Gastro-esophageal reflux disease without esophagitis: Secondary | ICD-10-CM | POA: Insufficient documentation

## 2015-10-18 DIAGNOSIS — S83281A Other tear of lateral meniscus, current injury, right knee, initial encounter: Secondary | ICD-10-CM | POA: Diagnosis not present

## 2015-10-18 DIAGNOSIS — Z79899 Other long term (current) drug therapy: Secondary | ICD-10-CM | POA: Insufficient documentation

## 2015-10-18 DIAGNOSIS — Z90710 Acquired absence of both cervix and uterus: Secondary | ICD-10-CM | POA: Insufficient documentation

## 2015-10-18 DIAGNOSIS — M1711 Unilateral primary osteoarthritis, right knee: Secondary | ICD-10-CM | POA: Insufficient documentation

## 2015-10-18 DIAGNOSIS — Z7982 Long term (current) use of aspirin: Secondary | ICD-10-CM | POA: Insufficient documentation

## 2015-10-18 DIAGNOSIS — Z7984 Long term (current) use of oral hypoglycemic drugs: Secondary | ICD-10-CM | POA: Insufficient documentation

## 2015-10-18 DIAGNOSIS — E119 Type 2 diabetes mellitus without complications: Secondary | ICD-10-CM | POA: Insufficient documentation

## 2015-10-18 DIAGNOSIS — M2240 Chondromalacia patellae, unspecified knee: Secondary | ICD-10-CM | POA: Insufficient documentation

## 2015-10-18 DIAGNOSIS — X58XXXA Exposure to other specified factors, initial encounter: Secondary | ICD-10-CM | POA: Insufficient documentation

## 2015-10-18 DIAGNOSIS — S83289A Other tear of lateral meniscus, current injury, unspecified knee, initial encounter: Secondary | ICD-10-CM | POA: Insufficient documentation

## 2015-10-18 DIAGNOSIS — E785 Hyperlipidemia, unspecified: Secondary | ICD-10-CM | POA: Insufficient documentation

## 2015-10-18 HISTORY — PX: KNEE ARTHROSCOPY WITH LATERAL MENISECTOMY: SHX6193

## 2015-10-18 SURGERY — ARTHROSCOPY, KNEE, WITH LATERAL MENISCECTOMY
Anesthesia: General | Site: Knee | Laterality: Right

## 2015-10-18 MED ORDER — SUCCINYLCHOLINE CHLORIDE 20 MG/ML IJ SOLN
INTRAMUSCULAR | Status: AC
  Filled 2015-10-18: qty 1

## 2015-10-18 MED ORDER — BUPIVACAINE-EPINEPHRINE (PF) 0.5% -1:200000 IJ SOLN
INTRAMUSCULAR | Status: AC
  Filled 2015-10-18: qty 60

## 2015-10-18 MED ORDER — CHLORHEXIDINE GLUCONATE 4 % EX LIQD: 60.0000 mL | Freq: Once | CUTANEOUS | Status: DC

## 2015-10-18 MED ORDER — ONDANSETRON HCL 4 MG/2ML IJ SOLN
INTRAMUSCULAR | Status: AC
  Filled 2015-10-18: qty 2

## 2015-10-18 MED ORDER — FENTANYL CITRATE (PF) 100 MCG/2ML IJ SOLN
INTRAMUSCULAR | Status: AC
  Filled 2015-10-18: qty 2

## 2015-10-18 MED ORDER — PROPOFOL 10 MG/ML IV BOLUS
INTRAVENOUS | Status: DC | PRN
  Administered 2015-10-18: 180 mg via INTRAVENOUS

## 2015-10-18 MED ORDER — EPINEPHRINE HCL 1 MG/ML IJ SOLN
INTRAMUSCULAR | Status: AC
Start: 2015-10-18 — End: 2015-10-18
  Filled 2015-10-18: qty 3

## 2015-10-18 MED ORDER — ONDANSETRON HCL 4 MG/2ML IJ SOLN
4.0000 mg | Freq: Once | INTRAMUSCULAR | Status: AC
  Administered 2015-10-18: 4 mg via INTRAVENOUS

## 2015-10-18 MED ORDER — ONDANSETRON HCL 4 MG/2ML IJ SOLN: 4.0000 mg | Freq: Once | INTRAMUSCULAR | Status: DC | PRN

## 2015-10-18 MED ORDER — HYDROCODONE-ACETAMINOPHEN 5-325 MG PO TABS
ORAL_TABLET | ORAL | Status: AC
  Filled 2015-10-18: qty 1

## 2015-10-18 MED ORDER — BUPIVACAINE-EPINEPHRINE (PF) 0.5% -1:200000 IJ SOLN
INTRAMUSCULAR | Status: DC | PRN
  Administered 2015-10-18: 60 mL via PERINEURAL

## 2015-10-18 MED ORDER — KETOROLAC TROMETHAMINE 30 MG/ML IJ SOLN
INTRAMUSCULAR | Status: AC
  Filled 2015-10-18: qty 1

## 2015-10-18 MED ORDER — PROPOFOL 10 MG/ML IV BOLUS
INTRAVENOUS | Status: AC
  Filled 2015-10-18: qty 20

## 2015-10-18 MED ORDER — HYDROCODONE-ACETAMINOPHEN 5-325 MG PO TABS
1.0000 | ORAL_TABLET | Freq: Once | ORAL | Status: AC
  Administered 2015-10-18: 1 via ORAL

## 2015-10-18 MED ORDER — FENTANYL CITRATE (PF) 100 MCG/2ML IJ SOLN
25.0000 ug | INTRAMUSCULAR | Status: DC | PRN
  Administered 2015-10-18 (×2): 50 ug via INTRAVENOUS

## 2015-10-18 MED ORDER — EPINEPHRINE HCL 1 MG/ML IJ SOLN
INTRAMUSCULAR | Status: AC
  Filled 2015-10-18: qty 1

## 2015-10-18 MED ORDER — KETOROLAC TROMETHAMINE 30 MG/ML IJ SOLN
30.0000 mg | Freq: Once | INTRAMUSCULAR | Status: AC
  Administered 2015-10-18: 30 mg via INTRAVENOUS

## 2015-10-18 MED ORDER — MIDAZOLAM HCL 2 MG/2ML IJ SOLN
1.0000 mg | INTRAMUSCULAR | Status: DC | PRN
  Administered 2015-10-18: 2 mg via INTRAVENOUS

## 2015-10-18 MED ORDER — FENTANYL CITRATE (PF) 100 MCG/2ML IJ SOLN
INTRAMUSCULAR | Status: DC | PRN
  Administered 2015-10-18 (×4): 25 ug via INTRAVENOUS
  Administered 2015-10-18: 50 ug via INTRAVENOUS
  Administered 2015-10-18 (×2): 25 ug via INTRAVENOUS

## 2015-10-18 MED ORDER — LIDOCAINE HCL (PF) 1 % IJ SOLN
INTRAMUSCULAR | Status: AC
  Filled 2015-10-18: qty 5

## 2015-10-18 MED ORDER — LIDOCAINE HCL (CARDIAC) 20 MG/ML IV SOLN
INTRAVENOUS | Status: DC | PRN
  Administered 2015-10-18: 50 mg via INTRAVENOUS

## 2015-10-18 MED ORDER — SODIUM CHLORIDE 0.9 % IR SOLN
Status: DC | PRN
  Administered 2015-10-18 (×4): 3000 mL

## 2015-10-18 MED ORDER — MIDAZOLAM HCL 2 MG/2ML IJ SOLN
INTRAMUSCULAR | Status: AC
  Filled 2015-10-18: qty 2

## 2015-10-18 MED ORDER — GLYCOPYRROLATE 0.2 MG/ML IJ SOLN
0.2000 mg | Freq: Once | INTRAMUSCULAR | Status: AC
  Administered 2015-10-18: 0.2 mg via INTRAVENOUS

## 2015-10-18 MED ORDER — CEFAZOLIN SODIUM-DEXTROSE 2-4 GM/100ML-% IV SOLN
2.0000 g | INTRAVENOUS | Status: AC
  Administered 2015-10-18: 2 g via INTRAVENOUS
  Filled 2015-10-18: qty 100

## 2015-10-18 MED ORDER — GLYCOPYRROLATE 0.2 MG/ML IJ SOLN
INTRAMUSCULAR | Status: AC
  Filled 2015-10-18: qty 1

## 2015-10-18 MED ORDER — HYDROCODONE-ACETAMINOPHEN 10-325 MG PO TABS: 1.0000 | ORAL_TABLET | ORAL | Status: DC | PRN

## 2015-10-18 MED ORDER — LACTATED RINGERS IV SOLN
INTRAVENOUS | Status: DC
  Administered 2015-10-18 (×2): via INTRAVENOUS

## 2015-10-18 MED ORDER — PROMETHAZINE HCL 12.5 MG PO TABS: 12.5000 mg | ORAL_TABLET | Freq: Four times a day (QID) | ORAL | Status: DC | PRN

## 2015-10-18 MED ORDER — FENTANYL CITRATE (PF) 100 MCG/2ML IJ SOLN
25.0000 ug | INTRAMUSCULAR | Status: AC
  Administered 2015-10-18: 25 ug via INTRAVENOUS

## 2015-10-18 SURGICAL SUPPLY — 52 items
BAG HAMPER (MISCELLANEOUS) ×2 IMPLANT
BANDAGE ELASTIC 6 VELCRO NS (GAUZE/BANDAGES/DRESSINGS) ×2 IMPLANT
BIT DRILL 2.0MX128MM (BIT) IMPLANT
BLADE 11 SAFETY STRL DISP (BLADE) ×2 IMPLANT
BLADE AGGRESSIVE PLUS 4.0 (BLADE) ×2 IMPLANT
CHLORAPREP W/TINT 26ML (MISCELLANEOUS) ×2 IMPLANT
CLOTH BEACON ORANGE TIMEOUT ST (SAFETY) ×2 IMPLANT
COOLER CRYO IC GRAV AND TUBE (ORTHOPEDIC SUPPLIES) ×2 IMPLANT
COVER PROBE W GEL 5X96 (DRAPES) ×2 IMPLANT
CUFF CRYO KNEE LG 20X31 COOLER (ORTHOPEDIC SUPPLIES) ×2 IMPLANT
CUFF TOURNIQUET SINGLE 44IN (TOURNIQUET CUFF) ×2 IMPLANT
CUTTER ANGLED DBL BITE 4.5 (BURR) IMPLANT
DECANTER SPIKE VIAL GLASS SM (MISCELLANEOUS) ×4 IMPLANT
ELECT REM PT RETURN 9FT ADLT (ELECTROSURGICAL)
ELECTRODE REM PT RTRN 9FT ADLT (ELECTROSURGICAL) IMPLANT
GAUZE SPONGE 4X4 12PLY STRL (GAUZE/BANDAGES/DRESSINGS) ×2 IMPLANT
GAUZE SPONGE 4X4 16PLY XRAY LF (GAUZE/BANDAGES/DRESSINGS) ×2 IMPLANT
GAUZE XEROFORM 5X9 LF (GAUZE/BANDAGES/DRESSINGS) ×2 IMPLANT
GLOVE BIOGEL PI IND STRL 7.0 (GLOVE) ×2 IMPLANT
GLOVE BIOGEL PI INDICATOR 7.0 (GLOVE) ×2
GLOVE ECLIPSE 6.5 STRL STRAW (GLOVE) ×4 IMPLANT
GLOVE SKINSENSE NS SZ8.0 LF (GLOVE) ×1
GLOVE SKINSENSE STRL SZ8.0 LF (GLOVE) ×1 IMPLANT
GLOVE SS N UNI LF 8.5 STRL (GLOVE) ×2 IMPLANT
GOWN PREVENTION PLUS XLARGE (GOWN DISPOSABLE) ×2 IMPLANT
GOWN STRL REUS W/TWL LRG LVL3 (GOWN DISPOSABLE) ×4 IMPLANT
GOWN STRL REUS W/TWL XL LVL3 (GOWN DISPOSABLE) ×2 IMPLANT
HLDR LEG FOAM (MISCELLANEOUS) ×1 IMPLANT
IV NS IRRIG 3000ML ARTHROMATIC (IV SOLUTION) ×8 IMPLANT
KIT BLADEGUARD II DBL (SET/KITS/TRAYS/PACK) ×2 IMPLANT
KIT ROOM TURNOVER AP CYSTO (KITS) ×2 IMPLANT
LEG HOLDER FOAM (MISCELLANEOUS) ×1
MANIFOLD NEPTUNE II (INSTRUMENTS) ×2 IMPLANT
MARKER SKIN DUAL TIP RULER LAB (MISCELLANEOUS) ×2 IMPLANT
NEEDLE HYPO 18GX1.5 BLUNT FILL (NEEDLE) ×2 IMPLANT
NEEDLE HYPO 21X1.5 SAFETY (NEEDLE) ×2 IMPLANT
NEEDLE SPNL 18GX3.5 QUINCKE PK (NEEDLE) ×2 IMPLANT
NS IRRIG 1000ML POUR BTL (IV SOLUTION) ×2 IMPLANT
PACK ARTHRO LIMB DRAPE STRL (MISCELLANEOUS) ×2 IMPLANT
PAD ABD 5X9 TENDERSORB (GAUZE/BANDAGES/DRESSINGS) ×2 IMPLANT
PAD ARMBOARD 7.5X6 YLW CONV (MISCELLANEOUS) ×2 IMPLANT
PADDING CAST COTTON 6X4 STRL (CAST SUPPLIES) ×2 IMPLANT
PADDING WEBRIL 6 STERILE (GAUZE/BANDAGES/DRESSINGS) ×2 IMPLANT
SET ARTHROSCOPY INST (INSTRUMENTS) ×2 IMPLANT
SET ARTHROSCOPY PUMP TUBE (IRRIGATION / IRRIGATOR) ×2 IMPLANT
SET BASIN LINEN APH (SET/KITS/TRAYS/PACK) ×2 IMPLANT
SUT ETHILON 3 0 FSL (SUTURE) ×2 IMPLANT
SYR 30ML LL (SYRINGE) ×2 IMPLANT
SYRINGE 10CC LL (SYRINGE) ×2 IMPLANT
WAND 90 DEG TURBOVAC W/CORD (SURGICAL WAND) ×2 IMPLANT
WATER STERILE IRR 1000ML POUR (IV SOLUTION) ×2 IMPLANT
YANKAUER SUCT BULB TIP 10FT TU (MISCELLANEOUS) ×6 IMPLANT

## 2015-10-18 NOTE — Anesthesia Procedure Notes (Signed)
Procedure Name: LMA Insertion Date/Time: 10/18/2015 12:24 PM Performed by: Andree Elk, Maribell Demeo A Pre-anesthesia Checklist: Timeout performed, Patient identified, Emergency Drugs available, Suction available and Patient being monitored Patient Re-evaluated:Patient Re-evaluated prior to inductionOxygen Delivery Method: Circle system utilized Preoxygenation: Pre-oxygenation with 100% oxygen Intubation Type: IV induction Ventilation: Mask ventilation without difficulty LMA: LMA inserted LMA Size: 4.0 Number of attempts: 1 Placement Confirmation: positive ETCO2 and breath sounds checked- equal and bilateral Tube secured with: Tape Dental Injury: Teeth and Oropharynx as per pre-operative assessment and Dental damage

## 2015-10-18 NOTE — Anesthesia Postprocedure Evaluation (Signed)
Anesthesia Post Note  Patient: Meagan Reyes  Procedure(s) Performed: Procedure(s) (LRB): RIGHT KNEE ARTHROSCOPY WITH LATERAL MENISECTOMY (Right)  Patient location during evaluation: PACU Anesthesia Type: General Level of consciousness: awake and alert and oriented Pain management: pain level controlled Vital Signs Assessment: post-procedure vital signs reviewed and stable Respiratory status: spontaneous breathing Cardiovascular status: stable Postop Assessment: no signs of nausea or vomiting Anesthetic complications: no    Last Vitals:  Filed Vitals:   10/18/15 1217 10/18/15 1330  BP:  129/70  Temp: 36.8 C 36.9 C  Resp:  12    Last Pain:  Filed Vitals:   10/18/15 1338  PainSc: 7                  Sumaiyah Markert A

## 2015-10-18 NOTE — Transfer of Care (Signed)
Immediate Anesthesia Transfer of Care Note  Patient: Meagan Reyes  Procedure(s) Performed: Procedure(s): RIGHT KNEE ARTHROSCOPY WITH LATERAL MENISECTOMY (Right)  Patient Location: PACU  Anesthesia Type:General  Level of Consciousness: awake, oriented and patient cooperative  Airway & Oxygen Therapy: Patient Spontanous Breathing and Patient connected to face mask oxygen  Post-op Assessment: Report given to RN and Post -op Vital signs reviewed and stable  Post vital signs: Reviewed and stable  Last Vitals:  Filed Vitals:   10/18/15 1210 10/18/15 1217  BP:    Temp:  36.8 C  Resp: 10     Complications: No apparent anesthesia complications

## 2015-10-18 NOTE — Interval H&P Note (Signed)
History and Physical Interval Note:  10/18/2015 11:53 AM  Meagan Reyes  has presented today for surgery, with the diagnosis of lateral meniscus tear right knee  The various methods of treatment have been discussed with the patient and family. After consideration of risks, benefits and other options for treatment, the patient has consented to  Procedure(s) with comments: KNEE ARTHROSCOPY WITH LATERAL MENISECTOMY (Right) - Needs crutch training as a surgical intervention .  The patient's history has been reviewed, patient examined, no change in status, stable for surgery.  I have reviewed the patient's chart and labs.  Questions were answered to the patient's satisfaction.     Arther Abbott

## 2015-10-18 NOTE — Op Note (Signed)
10/18/2015  1:21 PM  PATIENT:  Meagan Reyes  60 y.o. female  PRE-OPERATIVE DIAGNOSIS:  lateral meniscus tear right knee  POST-OPERATIVE DIAGNOSIS:  lateral meniscus tear right knee,osteoarthritis right knee  PROCEDURE:  Procedure(s): RIGHT KNEE ARTHROSCOPY WITH LATERAL MENISECTOMY (Right)  Findings Torn lateral meniscus anterior horn grade 3 chondral lesion lateral femoral condyle grade 4 lesion lateral tibial plateau  degeneration of the anterior cruciate ligament   normal medial meniscus normal medial tibial plateau grade 2 chondral lesions of the medial femoral condyle   grade 3 chondral lesion of the lateral patella grade 3 chondral lesion of the trochlea   SURGEON:  Surgeon(s) and Role:    * [name], MD - Primary  PHYSICIAN ASSISTANT:   ASSISTANTS: none   ANESTHESIA:   general  EBL:  Total I/O In: 1000 [I.V.:1000] Out: 5 [Blood:5]  BLOOD ADMINISTERED:none  DRAINS: none   LOCAL MEDICATIONS USED:  MARCAINE     SPECIMEN:  No Specimen  DISPOSITION OF SPECIMEN:  N/A  COUNTS:  YES  TOURNIQUET:    DICTATION: .Dragon Dictation  PLAN OF CARE: Discharge to home after PACU  PATIENT DISPOSITION:  PACU - hemodynamically stable.   Delay start of Pharmacological VTE agent (>24hrs) due to surgical blood loss or risk of bleeding: not applicable  Knee arthroscopy dictation  The patient was identified in the preoperative holding area using 2 approved identification mechanisms. The chart was reviewed and updated. The surgical site was confirmed as right knee and marked with an indelible marker.  The patient was taken to the operating room for anesthesia. After successful general anesthesia, Ancef was used as IV antibiotics.  The patient was placed in the supine position with the (right) the operative extremity in an arthroscopic leg holder and the opposite extremity in a padded leg holder.  The timeout was executed.  A lateral portal was established  with an 11 blade and the scope was introduced into the joint. A diagnostic arthroscopy was performed in circumferential manner examining the entire knee joint. A medial portal was established and the diagnostic arthroscopy was repeated using a probe to palpate intra-articular structures as they were encountered.     The lateral meniscus was resected using a shaver. The meniscus was balanced with a combination of a motorized shaver and a 90 ArthroCare wand until a stable rim was obtained.   We did chondral debridements of the lateral femoral condyle, patella, medial femoral condyle and a debridement of the anterior cruciate ligament    The arthroscopic pump was placed on the wash mode and any excess debris was removed from the joint using suction.  60 cc of Marcaine with epinephrine was injected through the arthroscope.  The portals were closed with 3-0 nylon suture.  A sterile bandage, Ace wrap and Cryo/Cuff was placed and the Cryo/Cuff was activated. The patient was taken to the recovery room in stable condition.   Postop plan Weightbearing as tolerated Early range of motion Physical therapy Follow-up within a week

## 2015-10-18 NOTE — Anesthesia Preprocedure Evaluation (Signed)
Anesthesia Evaluation  Patient identified by MRN, date of birth, ID band Patient awake    Reviewed: Allergy & Precautions, NPO status , Patient's Chart, lab work & pertinent test results  Airway Mallampati: III  TM Distance: >3 FB     Dental  (+) Teeth Intact, Dental Advisory Given   Pulmonary neg pulmonary ROS,    breath sounds clear to auscultation       Cardiovascular hypertension, Pt. on medications  Rhythm:Regular Rate:Normal     Neuro/Psych    GI/Hepatic GERD  Medicated and Controlled,  Endo/Other  Morbid obesity  Renal/GU      Musculoskeletal   Abdominal   Peds  Hematology   Anesthesia Other Findings   Reproductive/Obstetrics                             Anesthesia Physical Anesthesia Plan  ASA: II  Anesthesia Plan: General   Post-op Pain Management:    Induction: Intravenous  Airway Management Planned: LMA  Additional Equipment:   Intra-op Plan:   Post-operative Plan: Extubation in OR  Informed Consent: I have reviewed the patients History and Physical, chart, labs and discussed the procedure including the risks, benefits and alternatives for the proposed anesthesia with the patient or authorized representative who has indicated his/her understanding and acceptance.     Plan Discussed with:   Anesthesia Plan Comments:         Anesthesia Quick Evaluation

## 2015-10-18 NOTE — Brief Op Note (Signed)
10/18/2015  1:21 PM  PATIENT:  Meagan Reyes  60 y.o. female  PRE-OPERATIVE DIAGNOSIS:  lateral meniscus tear right knee  POST-OPERATIVE DIAGNOSIS:  lateral meniscus tear right knee,osteoarthritis right knee  PROCEDURE:  Procedure(s): RIGHT KNEE ARTHROSCOPY WITH LATERAL MENISECTOMY (Right)  Findings Torn lateral meniscus anterior horn grade 3 chondral lesion lateral femoral condyle grade 4 lesion lateral tibial plateau  degeneration of the anterior cruciate ligament   normal medial meniscus normal medial tibial plateau grade 2 chondral lesions of the medial femoral condyle   grade 3 chondral lesion of the lateral patella grade 3 chondral lesion of the trochlea   SURGEON:  Surgeon(s) and Role:    * [name], MD - Primary  PHYSICIAN ASSISTANT:   ASSISTANTS: none   ANESTHESIA:   general  EBL:  Total I/O In: 1000 [I.V.:1000] Out: 5 [Blood:5]  BLOOD ADMINISTERED:none  DRAINS: none   LOCAL MEDICATIONS USED:  MARCAINE     SPECIMEN:  No Specimen  DISPOSITION OF SPECIMEN:  N/A  COUNTS:  YES  TOURNIQUET:    DICTATION: .Dragon Dictation  PLAN OF CARE: Discharge to home after PACU  PATIENT DISPOSITION:  PACU - hemodynamically stable.   Delay start of Pharmacological VTE agent (>24hrs) due to surgical blood loss or risk of bleeding: not applicable  Knee arthroscopy dictation  The patient was identified in the preoperative holding area using 2 approved identification mechanisms. The chart was reviewed and updated. The surgical site was confirmed as right knee and marked with an indelible marker.  The patient was taken to the operating room for anesthesia. After successful general anesthesia, Ancef was used as IV antibiotics.  The patient was placed in the supine position with the (right) the operative extremity in an arthroscopic leg holder and the opposite extremity in a padded leg holder.  The timeout was executed.  A lateral portal was established  with an 11 blade and the scope was introduced into the joint. A diagnostic arthroscopy was performed in circumferential manner examining the entire knee joint. A medial portal was established and the diagnostic arthroscopy was repeated using a probe to palpate intra-articular structures as they were encountered.     The lateral meniscus was resected using a shaver. The meniscus was balanced with a combination of a motorized shaver and a 90 ArthroCare wand until a stable rim was obtained.   We did chondral debridements of the lateral femoral condyle, patella, medial femoral condyle and a debridement of the anterior cruciate ligament    The arthroscopic pump was placed on the wash mode and any excess debris was removed from the joint using suction.  60 cc of Marcaine with epinephrine was injected through the arthroscope.  The portals were closed with 3-0 nylon suture.  A sterile bandage, Ace wrap and Cryo/Cuff was placed and the Cryo/Cuff was activated. The patient was taken to the recovery room in stable condition.

## 2015-10-19 ENCOUNTER — Encounter (HOSPITAL_COMMUNITY): Payer: Self-pay | Admitting: Orthopedic Surgery

## 2015-10-19 ENCOUNTER — Ambulatory Visit (INDEPENDENT_AMBULATORY_CARE_PROVIDER_SITE_OTHER): Payer: BLUE CROSS/BLUE SHIELD | Admitting: Orthopedic Surgery

## 2015-10-19 VITALS — BP 129/73 | Ht 69.0 in | Wt 300.0 lb

## 2015-10-19 DIAGNOSIS — Z9889 Other specified postprocedural states: Secondary | ICD-10-CM

## 2015-10-19 NOTE — Progress Notes (Signed)
Patient ID: Meagan Reyes, female   DOB: 1955-11-29, 60 y.o.   MRN: [id number]   POST OP VISIT  S/P KNEE ARTHROSCOPY  Chief Complaint  Patient presents with  . Follow-up    POST OP 1, SARK LM, DOS 10/18/15   Ht 5\' 9"  (1.753 m)  Wt 300 lb (136.079 kg)  BMI 44.28 kg/m2  The surgical sites look clean dry and intact.  ASSESSMENT AND PLAN   Start physical therapy and arrange follow-up visit

## 2015-10-19 NOTE — Patient Instructions (Signed)
Continue icing the knee. 4 times a day with a Cryo/Cuff use it for 20-30 minutes at a time more if swelling is not controlled  Start bending the knee 253 times a day  Apply weight to your operative leg as tolerated  Start physical therapy - call the number given to set up a first appointment

## 2015-10-22 ENCOUNTER — Other Ambulatory Visit: Payer: Self-pay | Admitting: Orthopedic Surgery

## 2015-10-22 ENCOUNTER — Ambulatory Visit (INDEPENDENT_AMBULATORY_CARE_PROVIDER_SITE_OTHER): Payer: BLUE CROSS/BLUE SHIELD | Admitting: Orthopedic Surgery

## 2015-10-22 ENCOUNTER — Ambulatory Visit (HOSPITAL_COMMUNITY)
Admission: RE | Admit: 2015-10-22 | Discharge: 2015-10-22 | Disposition: A | Discharge status: Home / Self Care | Payer: BLUE CROSS/BLUE SHIELD | Source: Ambulatory Visit | Attending: Orthopedic Surgery | Admitting: Orthopedic Surgery

## 2015-10-22 VITALS — BP 119/71 | HR 77 | Ht 69.0 in | Wt 300.0 lb

## 2015-10-22 DIAGNOSIS — I82401 Acute embolism and thrombosis of unspecified deep veins of right lower extremity: Secondary | ICD-10-CM

## 2015-10-22 DIAGNOSIS — M79661 Pain in right lower leg: Secondary | ICD-10-CM | POA: Diagnosis not present

## 2015-10-22 DIAGNOSIS — M7121 Synovial cyst of popliteal space [Baker], right knee: Secondary | ICD-10-CM | POA: Diagnosis not present

## 2015-10-22 DIAGNOSIS — Z9889 Other specified postprocedural states: Secondary | ICD-10-CM

## 2015-10-22 NOTE — Progress Notes (Signed)
Chief Complaint  Patient presents with  . Wound Check    SARK DOS 10/18/15    Right leg swelling  Called the office  Right leg is swollen calf is tender Leg is a little red   She has large ankles anyway   [place] to check for dvt  Keep apt if normal and treat if +

## 2015-10-22 NOTE — Addendum Note (Signed)
Addended by: Moreen Fowler R on: 10/22/2015 04:25 PM   Modules accepted: Orders

## 2015-10-24 ENCOUNTER — Telehealth: Payer: Self-pay | Admitting: Orthopedic Surgery

## 2015-10-24 NOTE — Telephone Encounter (Signed)
Patient called to relay she is aware that her Ultrasound result showed no blood clot; asking what the swelling and tenderness she is having is caused from.  Please advise. Ph [phone]

## 2015-10-25 DIAGNOSIS — M25561 Pain in right knee: Secondary | ICD-10-CM | POA: Diagnosis not present

## 2015-10-25 DIAGNOSIS — R262 Difficulty in walking, not elsewhere classified: Secondary | ICD-10-CM | POA: Diagnosis not present

## 2015-10-25 NOTE — Telephone Encounter (Signed)
Patient left voice message about question below - nurse may be able to advise?

## 2015-10-25 NOTE — Telephone Encounter (Signed)
Patient is concerned about the lump in her knee.  She says this was there before surgery.  She is elevating and icing her knee at this time.  She would like to know what else to do.  Please advise.

## 2015-10-30 DIAGNOSIS — M25561 Pain in right knee: Secondary | ICD-10-CM | POA: Diagnosis not present

## 2015-10-30 DIAGNOSIS — R262 Difficulty in walking, not elsewhere classified: Secondary | ICD-10-CM | POA: Diagnosis not present

## 2015-10-30 NOTE — Telephone Encounter (Signed)
Made an appointment for patient to come in.

## 2015-10-31 ENCOUNTER — Encounter: Payer: Self-pay | Admitting: Orthopaedic Surgery

## 2015-10-31 ENCOUNTER — Ambulatory Visit (INDEPENDENT_AMBULATORY_CARE_PROVIDER_SITE_OTHER): Payer: BLUE CROSS/BLUE SHIELD | Admitting: Orthopaedic Surgery

## 2015-10-31 VITALS — BP 111/70 | HR 66 | Temp 98.1°F | Resp 16 | Ht 69.0 in | Wt 300.0 lb

## 2015-10-31 DIAGNOSIS — Z9889 Other specified postprocedural states: Secondary | ICD-10-CM

## 2015-10-31 DIAGNOSIS — M25561 Pain in right knee: Secondary | ICD-10-CM

## 2015-10-31 MED ORDER — PREDNISONE 10 MG (21) PO TBPK: ORAL_TABLET | ORAL | Status: DC

## 2015-10-31 NOTE — Progress Notes (Signed)
CC:  My knee is better.  She is post arthroscopy of the right knee.  She is walking well.  She had pain in the posterior calf and knee.  Doppler study was negative.  She is having less pain.  She has used ice and it helps.  She has no giving way.  The wounds are well healed.  She has ROM of 0 to 115.  Gait is good.  NV is intact.  Encounter Diagnoses  Name Primary?  . S/P right knee arthroscopy Yes  . Right knee pain    She is to continue her exercises.  Return in two weeks.  Call if any problem.

## 2015-10-31 NOTE — Patient Instructions (Signed)
Continue exercises

## 2015-11-01 DIAGNOSIS — R262 Difficulty in walking, not elsewhere classified: Secondary | ICD-10-CM | POA: Diagnosis not present

## 2015-11-01 DIAGNOSIS — M25561 Pain in right knee: Secondary | ICD-10-CM | POA: Diagnosis not present

## 2015-11-06 DIAGNOSIS — M25561 Pain in right knee: Secondary | ICD-10-CM | POA: Diagnosis not present

## 2015-11-06 DIAGNOSIS — R262 Difficulty in walking, not elsewhere classified: Secondary | ICD-10-CM | POA: Diagnosis not present

## 2015-11-08 DIAGNOSIS — R262 Difficulty in walking, not elsewhere classified: Secondary | ICD-10-CM | POA: Diagnosis not present

## 2015-11-08 DIAGNOSIS — M25561 Pain in right knee: Secondary | ICD-10-CM | POA: Diagnosis not present

## 2015-11-13 ENCOUNTER — Encounter (HOSPITAL_COMMUNITY): Payer: Self-pay | Admitting: Emergency Medicine

## 2015-11-13 ENCOUNTER — Emergency Department (HOSPITAL_COMMUNITY): Payer: BLUE CROSS/BLUE SHIELD

## 2015-11-13 ENCOUNTER — Emergency Department (HOSPITAL_COMMUNITY)
Admission: EM | Admit: 2015-11-13 | Discharge: 2015-11-13 | Disposition: A | Discharge status: Home / Self Care | Payer: BLUE CROSS/BLUE SHIELD | Attending: Emergency Medicine | Admitting: Emergency Medicine

## 2015-11-13 DIAGNOSIS — Y929 Unspecified place or not applicable: Secondary | ICD-10-CM | POA: Insufficient documentation

## 2015-11-13 DIAGNOSIS — S199XXA Unspecified injury of neck, initial encounter: Secondary | ICD-10-CM | POA: Diagnosis not present

## 2015-11-13 DIAGNOSIS — S161XXA Strain of muscle, fascia and tendon at neck level, initial encounter: Secondary | ICD-10-CM | POA: Diagnosis not present

## 2015-11-13 DIAGNOSIS — M546 Pain in thoracic spine: Secondary | ICD-10-CM | POA: Insufficient documentation

## 2015-11-13 DIAGNOSIS — E785 Hyperlipidemia, unspecified: Secondary | ICD-10-CM | POA: Insufficient documentation

## 2015-11-13 DIAGNOSIS — I10 Essential (primary) hypertension: Secondary | ICD-10-CM | POA: Insufficient documentation

## 2015-11-13 DIAGNOSIS — S299XXA Unspecified injury of thorax, initial encounter: Secondary | ICD-10-CM | POA: Diagnosis not present

## 2015-11-13 DIAGNOSIS — Y999 Unspecified external cause status: Secondary | ICD-10-CM | POA: Diagnosis not present

## 2015-11-13 DIAGNOSIS — Y9389 Activity, other specified: Secondary | ICD-10-CM | POA: Diagnosis not present

## 2015-11-13 MED ORDER — NAPROXEN 500 MG PO TABS: 500.0000 mg | ORAL_TABLET | Freq: Two times a day (BID) | ORAL | Status: DC

## 2015-11-13 MED ORDER — CYCLOBENZAPRINE HCL 5 MG PO TABS: 5.0000 mg | ORAL_TABLET | Freq: Three times a day (TID) | ORAL | Status: DC | PRN

## 2015-11-13 NOTE — ED Notes (Signed)
Pt placed in c-collar. On and aligned.  

## 2015-11-13 NOTE — ED Notes (Signed)
Pt made aware to return if symptoms worsen or if any life threatening symptoms occur.   
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
## 2015-11-16 ENCOUNTER — Ambulatory Visit (INDEPENDENT_AMBULATORY_CARE_PROVIDER_SITE_OTHER): Payer: BLUE CROSS/BLUE SHIELD | Admitting: Orthopedic Surgery

## 2015-11-16 ENCOUNTER — Encounter: Payer: Self-pay | Admitting: Orthopedic Surgery

## 2015-11-16 VITALS — BP 120/67 | Ht 69.0 in | Wt 294.0 lb

## 2015-11-16 DIAGNOSIS — Z9889 Other specified postprocedural states: Secondary | ICD-10-CM
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
## 2016-01-17 DIAGNOSIS — E119 Type 2 diabetes mellitus without complications: Secondary | ICD-10-CM | POA: Diagnosis not present

## 2016-01-17 DIAGNOSIS — Z6837 Body mass index (BMI) 37.0-37.9, adult: Secondary | ICD-10-CM | POA: Diagnosis not present

## 2016-01-17 DIAGNOSIS — Z0000 Encounter for general adult medical examination without abnormal findings: Secondary | ICD-10-CM | POA: Diagnosis not present

## 2016-01-17 DIAGNOSIS — Z1389 Encounter for screening for other disorder: Secondary | ICD-10-CM | POA: Diagnosis not present

## 2016-01-17 DIAGNOSIS — N3000 Acute cystitis without hematuria: Secondary | ICD-10-CM | POA: Diagnosis not present

## 2016-02-11 ENCOUNTER — Other Ambulatory Visit: Payer: Self-pay | Admitting: Internal Medicine

## 2016-02-11 DIAGNOSIS — Z1231 Encounter for screening mammogram for malignant neoplasm of breast: Secondary | ICD-10-CM

## 2016-02-14 ENCOUNTER — Ambulatory Visit (HOSPITAL_COMMUNITY): Payer: BLUE CROSS/BLUE SHIELD

## 2016-02-27 ENCOUNTER — Other Ambulatory Visit: Payer: Self-pay | Admitting: Internal Medicine

## 2016-02-27 ENCOUNTER — Ambulatory Visit (HOSPITAL_COMMUNITY)
Admission: RE | Admit: 2016-02-27 | Discharge: 2016-02-27 | Disposition: A | Discharge status: Home / Self Care | Payer: BLUE CROSS/BLUE SHIELD | Source: Ambulatory Visit | Attending: Internal Medicine | Admitting: Internal Medicine

## 2016-02-27 ENCOUNTER — Ambulatory Visit (HOSPITAL_COMMUNITY): Payer: BLUE CROSS/BLUE SHIELD

## 2016-02-27 DIAGNOSIS — Z1231 Encounter for screening mammogram for malignant neoplasm of breast: Secondary | ICD-10-CM | POA: Diagnosis not present

## 2016-05-13 DIAGNOSIS — Z6841 Body Mass Index (BMI) 40.0 and over, adult: Secondary | ICD-10-CM | POA: Diagnosis not present

## 2016-05-13 DIAGNOSIS — R197 Diarrhea, unspecified: Secondary | ICD-10-CM | POA: Diagnosis not present

## 2016-05-13 DIAGNOSIS — Z1389 Encounter for screening for other disorder: Secondary | ICD-10-CM | POA: Diagnosis not present

## 2016-05-13 DIAGNOSIS — N390 Urinary tract infection, site not specified: Secondary | ICD-10-CM | POA: Diagnosis not present

## 2016-06-17 ENCOUNTER — Ambulatory Visit (INDEPENDENT_AMBULATORY_CARE_PROVIDER_SITE_OTHER): Payer: BLUE CROSS/BLUE SHIELD

## 2016-06-17 ENCOUNTER — Ambulatory Visit (INDEPENDENT_AMBULATORY_CARE_PROVIDER_SITE_OTHER): Payer: BLUE CROSS/BLUE SHIELD | Admitting: Orthopedic Surgery

## 2016-06-17 ENCOUNTER — Encounter: Payer: Self-pay | Admitting: Orthopedic Surgery

## 2016-06-17 DIAGNOSIS — M5441 Lumbago with sciatica, right side: Secondary | ICD-10-CM

## 2016-06-17 DIAGNOSIS — G8929 Other chronic pain: Secondary | ICD-10-CM | POA: Diagnosis not present

## 2016-06-17 DIAGNOSIS — M545 Low back pain: Secondary | ICD-10-CM

## 2016-06-17 MED ORDER — NORTRIPTYLINE HCL 10 MG PO CAPS: 10.0000 mg | ORAL_CAPSULE | Freq: Every day | ORAL | Status: DC

## 2016-06-17 NOTE — Addendum Note (Signed)
Addended by: [name] on: 06/17/2016 04:53 PM   Modules accepted: Orders

## 2016-06-17 NOTE — Addendum Note (Signed)
Addended by: Baldomero Lamy B on: 06/17/2016 04:58 PM   Modules accepted: Orders

## 2016-06-17 NOTE — Progress Notes (Signed)
Office visit   Chief complaint initially was right knee pain.  The patient had arthroscopy surgery right knee and March 2017. She had a lateral meniscal tear with osteoarthritis should degeneration of the anterior cruciate ligament there was a grade 2 chondral lesion of the medial femoral condyle grade 3 chondral lesion of the lateral patella and lateral trochlea medial meniscus was normal there was a grade 2 chondral lesion on the tibial plateau  She comes in complaining of really what his right leg pain. She has pain and burning along the lateral aspect of the right knee.  She does not complain of weightbearing pain in that area although she has some weightbearing pain in the medial and lateral aspect of the knee joint  The main thing is that when she lies down at night she has pain in the back of her knee in the popliteal fossa as well as back pain and lateral leg pain which radiates to the knee and occasionally just below it on the lateral side  She had an L5-S1 Ray cage fusion in 1999 and has intermittent back pain as well  The pain is present despite taking ibuprofen or meloxicam and Flexeril area  Review of Systems  Constitutional: Negative for chills, fever and weight loss.  Respiratory: Negative for shortness of breath.   Cardiovascular: Negative for chest pain.  Neurological: Negative for tingling.   Past Medical History:  Diagnosis Date  . Hyperlipemia   . Hypertension     Body habitus endomorphic. Oriented 3. Normal. Mood and affect normal. Ambulates without assistive devices.  Examination reveals tenderness in the lower portion of the lumbar spine at L4-S1. No tenderness on the left with tenderness to palpation on the right and this tracks down the right leg all the way to the foot with tenderness and tenderness in the popliteal fossa. She has tenderness over the medial hamstrings of the knee with minimal tenderness in the joint line medial and lateral. Knee stable.  Strength in the right leg is normal skin is intact pulses are good mild peripheral edema is noted on soft touch sensation is normal reflexes are 0-1+ at the knee and ankle in the straight leg raise reproduces some of the pain with radicular symptoms  Left knee: Alignment normal skin without erythema or nodularity  Right and left lower extremity strength in terms of dorsiflexion plantar flexion extension knee flexion is normal.  Current Meds  Medication Sig  . aspirin 81 MG chewable tablet Chew 81 mg by mouth daily. Takes in the evening  . CALCIUM PO Take 1 tablet by mouth daily.  . cyclobenzaprine (FLEXERIL) 5 MG tablet Take 1 tablet (5 mg total) by mouth 3 (three) times daily as needed for muscle spasms.  Marland Kitchen ibuprofen (ADVIL,MOTRIN) 200 MG tablet Take 400-600 mg by mouth every 6 (six) hours as needed for moderate pain.  Marland Kitchen lisinopril-hydrochlorothiazide (PRINZIDE,ZESTORETIC) 20-12.5 MG per tablet Take 2 tablets by mouth daily.  . meloxicam (MOBIC) 15 MG tablet Take 15 mg by mouth daily.  . Multiple Vitamins-Minerals (WOMENS DAILY FORMULA PO) Take 1 tablet by mouth daily.  . Omega-3 Fatty Acids (FISH OIL) 1000 MG CAPS Take 2,000 capsules by mouth 2 (two) times daily.  . ranitidine (ZANTAC) 300 MG tablet Take 300 mg by mouth at bedtime.   . [DISCONTINUED] HYDROcodone-acetaminophen (NORCO) 10-325 MG tablet Take 1 tablet by mouth every 4 (four) hours as needed.  . [DISCONTINUED] naproxen (NAPROSYN) 500 MG tablet Take 1 tablet (500 mg total) by  mouth 2 (two) times daily.  . [DISCONTINUED] predniSONE (STERAPRED UNI-PAK 21 TAB) 10 MG (21) TBPK tablet Take six pills the first day; then 5 pills the next day; then 4 pills the next day; then 3 pills the next day; then 2 pills the next day and the last day take one pill by mouth.  . [DISCONTINUED] promethazine (PHENERGAN) 12.5 MG tablet Take 1 tablet (12.5 mg total) by mouth every 6 (six) hours as needed for nausea or vomiting.   Data  Lumbar spine  x-ray  I have looked at the x-ray and it showsL5-S1 Ray cage fusion with degeneration above the level of the fusion and scoliosis with loss of lumbar lordosis  Recommend MRI lumbar spine evaluate prior lumbar fusion for above or below degeneration of the disc and/or herniation or increased spinal stenosis  Encounter Diagnosis  Name Primary?  . 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

## 2016-06-17 NOTE — Patient Instructions (Signed)
MRI lumbar spine    

## 2016-06-18 ENCOUNTER — Ambulatory Visit: Payer: BLUE CROSS/BLUE SHIELD | Admitting: Orthopaedic Surgery

## 2016-06-19 ENCOUNTER — Telehealth: Payer: Self-pay | Admitting: Orthopedic Surgery

## 2016-06-19 NOTE — Telephone Encounter (Signed)
Patient called asking about MRI appointment. She stated that because of her insurance out of pocket she was wanting to get it scheduled as soon as possible. I told her that you would have to see if it needed pre-authorization and then it can be scheduled. She is going to contact her insurance company and she if she needs that.

## 2016-06-19 NOTE — Telephone Encounter (Signed)
Pre authorization has been requested, awaiting response from insurance

## 2016-06-23 DIAGNOSIS — M5136 Other intervertebral disc degeneration, lumbar region: Secondary | ICD-10-CM | POA: Diagnosis not present

## 2016-06-23 DIAGNOSIS — M546 Pain in thoracic spine: Secondary | ICD-10-CM | POA: Diagnosis not present

## 2016-06-23 DIAGNOSIS — S134XXA Sprain of ligaments of cervical spine, initial encounter: Secondary | ICD-10-CM | POA: Diagnosis not present

## 2016-06-23 DIAGNOSIS — M47816 Spondylosis without myelopathy or radiculopathy, lumbar region: Secondary | ICD-10-CM | POA: Diagnosis not present

## 2016-06-23 DIAGNOSIS — S338XXA Sprain of other parts of lumbar spine and pelvis, initial encounter: Secondary | ICD-10-CM | POA: Diagnosis not present

## 2016-06-23 DIAGNOSIS — M47812 Spondylosis without myelopathy or radiculopathy, cervical region: Secondary | ICD-10-CM | POA: Diagnosis not present

## 2016-06-23 DIAGNOSIS — S8392XA Sprain of unspecified site of left knee, initial encounter: Secondary | ICD-10-CM | POA: Diagnosis not present

## 2016-06-25 DIAGNOSIS — M47816 Spondylosis without myelopathy or radiculopathy, lumbar region: Secondary | ICD-10-CM | POA: Diagnosis not present

## 2016-06-25 DIAGNOSIS — M47812 Spondylosis without myelopathy or radiculopathy, cervical region: Secondary | ICD-10-CM | POA: Diagnosis not present

## 2016-06-25 DIAGNOSIS — M5136 Other intervertebral disc degeneration, lumbar region: Secondary | ICD-10-CM | POA: Diagnosis not present

## 2016-06-25 DIAGNOSIS — M546 Pain in thoracic spine: Secondary | ICD-10-CM | POA: Diagnosis not present

## 2016-06-25 NOTE — Telephone Encounter (Signed)
PATIENT AWARE OF MRI AND FOLLOW UP APPT

## 2016-06-27 ENCOUNTER — Ambulatory Visit (HOSPITAL_COMMUNITY): Payer: BLUE CROSS/BLUE SHIELD

## 2016-06-30 ENCOUNTER — Ambulatory Visit (HOSPITAL_COMMUNITY)
Admission: RE | Admit: 2016-06-30 | Discharge: 2016-06-30 | Disposition: A | Discharge status: Home / Self Care | Payer: BLUE CROSS/BLUE SHIELD | Source: Ambulatory Visit | Attending: Orthopedic Surgery | Admitting: Orthopedic Surgery

## 2016-06-30 DIAGNOSIS — M5441 Lumbago with sciatica, right side: Secondary | ICD-10-CM | POA: Diagnosis not present

## 2016-06-30 DIAGNOSIS — Z981 Arthrodesis status: Secondary | ICD-10-CM | POA: Diagnosis not present

## 2016-06-30 DIAGNOSIS — M47816 Spondylosis without myelopathy or radiculopathy, lumbar region: Secondary | ICD-10-CM | POA: Diagnosis not present

## 2016-06-30 DIAGNOSIS — G8929 Other chronic pain: Secondary | ICD-10-CM

## 2016-06-30 DIAGNOSIS — M5116 Intervertebral disc disorders with radiculopathy, lumbar region: Secondary | ICD-10-CM | POA: Diagnosis not present

## 2016-06-30 DIAGNOSIS — M47812 Spondylosis without myelopathy or radiculopathy, cervical region: Secondary | ICD-10-CM | POA: Diagnosis not present

## 2016-06-30 DIAGNOSIS — M545 Low back pain: Secondary | ICD-10-CM | POA: Diagnosis not present

## 2016-06-30 DIAGNOSIS — M5136 Other intervertebral disc degeneration, lumbar region: Secondary | ICD-10-CM | POA: Diagnosis not present

## 2016-06-30 DIAGNOSIS — M546 Pain in thoracic spine: Secondary | ICD-10-CM | POA: Diagnosis not present

## 2016-07-03 DIAGNOSIS — M47812 Spondylosis without myelopathy or radiculopathy, cervical region: Secondary | ICD-10-CM | POA: Diagnosis not present

## 2016-07-03 DIAGNOSIS — M546 Pain in thoracic spine: Secondary | ICD-10-CM | POA: Diagnosis not present

## 2016-07-03 DIAGNOSIS — M5136 Other intervertebral disc degeneration, lumbar region: Secondary | ICD-10-CM | POA: Diagnosis not present

## 2016-07-03 DIAGNOSIS — M47816 Spondylosis without myelopathy or radiculopathy, lumbar region: Secondary | ICD-10-CM | POA: Diagnosis not present

## 2016-07-04 ENCOUNTER — Ambulatory Visit (INDEPENDENT_AMBULATORY_CARE_PROVIDER_SITE_OTHER): Payer: BLUE CROSS/BLUE SHIELD | Admitting: Orthopedic Surgery

## 2016-07-04 ENCOUNTER — Encounter: Payer: Self-pay | Admitting: Orthopedic Surgery

## 2016-07-04 DIAGNOSIS — M5441 Lumbago with sciatica, right side: Secondary | ICD-10-CM

## 2016-07-04 DIAGNOSIS — Z9889 Other specified postprocedural states: Secondary | ICD-10-CM

## 2016-07-04 DIAGNOSIS — G8929 Other chronic pain: Secondary | ICD-10-CM

## 2016-07-04 NOTE — Progress Notes (Signed)
Follow-up MRI results  Follow-up for back pain after lumbar fusion several years ago. Report was reviewed and results to risk discussed with the patient  She's having some mild knee discomfort as well but her back seems to be doing pretty good  He is to report Disc levels:   L1-2: Disc desiccation and mild disc space narrowing. Mild disc bulging without stenosis.   L2-3:  Normal disc.  Mild facet hypertrophy without stenosis.   L3-4: Disc desiccation. Mild facet hypertrophy without disc herniation or stenosis.   L4-5: Disc desiccation. Partial laminectomies. Minimal disc bulging and mild facet hypertrophy without stenosis.   L5-S1: Prior posterior decompression and interbody fusion. No stenosis.   IMPRESSION: 1. Prior L5-S1 fusion without residual stenosis. 2. Mild disc and facet degeneration elsewhere without stenosis.     Electronically Signed   By: Logan Bores M.D.   On: 06/30/2016 08:41  We decided to continue with nonoperative care follow-up 6 months recheck knee status post arthroscopy

## 2016-07-08 DIAGNOSIS — M546 Pain in thoracic spine: Secondary | ICD-10-CM | POA: Diagnosis not present

## 2016-07-08 DIAGNOSIS — M47812 Spondylosis without myelopathy or radiculopathy, cervical region: Secondary | ICD-10-CM | POA: Diagnosis not present

## 2016-07-08 DIAGNOSIS — M5136 Other intervertebral disc degeneration, lumbar region: Secondary | ICD-10-CM | POA: Diagnosis not present

## 2016-07-08 DIAGNOSIS — M47816 Spondylosis without myelopathy or radiculopathy, lumbar region: Secondary | ICD-10-CM | POA: Diagnosis not present

## 2016-07-11 DIAGNOSIS — M47816 Spondylosis without myelopathy or radiculopathy, lumbar region: Secondary | ICD-10-CM | POA: Diagnosis not present

## 2016-07-11 DIAGNOSIS — M5136 Other intervertebral disc degeneration, lumbar region: Secondary | ICD-10-CM | POA: Diagnosis not present

## 2016-07-11 DIAGNOSIS — M47812 Spondylosis without myelopathy or radiculopathy, cervical region: Secondary | ICD-10-CM | POA: Diagnosis not present

## 2016-07-11 DIAGNOSIS — M546 Pain in thoracic spine: Secondary | ICD-10-CM | POA: Diagnosis not present

## 2016-07-18 DIAGNOSIS — M546 Pain in thoracic spine: Secondary | ICD-10-CM | POA: Diagnosis not present

## 2016-07-18 DIAGNOSIS — M47816 Spondylosis without myelopathy or radiculopathy, lumbar region: Secondary | ICD-10-CM | POA: Diagnosis not present

## 2016-07-18 DIAGNOSIS — M5136 Other intervertebral disc degeneration, lumbar region: Secondary | ICD-10-CM | POA: Diagnosis not present

## 2016-07-18 DIAGNOSIS — M47812 Spondylosis without myelopathy or radiculopathy, cervical region: Secondary | ICD-10-CM | POA: Diagnosis not present

## 2016-07-23 DIAGNOSIS — M546 Pain in thoracic spine: Secondary | ICD-10-CM | POA: Diagnosis not present

## 2016-07-23 DIAGNOSIS — M47816 Spondylosis without myelopathy or radiculopathy, lumbar region: Secondary | ICD-10-CM | POA: Diagnosis not present

## 2016-07-23 DIAGNOSIS — M5136 Other intervertebral disc degeneration, lumbar region: Secondary | ICD-10-CM | POA: Diagnosis not present

## 2016-07-23 DIAGNOSIS — M47812 Spondylosis without myelopathy or radiculopathy, cervical region: Secondary | ICD-10-CM | POA: Diagnosis not present

## 2016-07-30 DIAGNOSIS — M546 Pain in thoracic spine: Secondary | ICD-10-CM | POA: Diagnosis not present

## 2016-07-30 DIAGNOSIS — M5136 Other intervertebral disc degeneration, lumbar region: Secondary | ICD-10-CM | POA: Diagnosis not present

## 2016-07-30 DIAGNOSIS — M47812 Spondylosis without myelopathy or radiculopathy, cervical region: Secondary | ICD-10-CM | POA: Diagnosis not present

## 2016-07-30 DIAGNOSIS — M47816 Spondylosis without myelopathy or radiculopathy, lumbar region: Secondary | ICD-10-CM | POA: Diagnosis not present

## 2016-08-18 DIAGNOSIS — L821 Other seborrheic keratosis: Secondary | ICD-10-CM | POA: Diagnosis not present

## 2016-08-18 DIAGNOSIS — D1800 Hemangioma unspecified site: Secondary | ICD-10-CM | POA: Diagnosis not present

## 2016-12-29 ENCOUNTER — Encounter: Payer: Self-pay | Admitting: Orthopedic Surgery

## 2016-12-29 ENCOUNTER — Ambulatory Visit (INDEPENDENT_AMBULATORY_CARE_PROVIDER_SITE_OTHER): Payer: BLUE CROSS/BLUE SHIELD | Admitting: Orthopedic Surgery

## 2016-12-29 DIAGNOSIS — Z9889 Other specified postprocedural states: Secondary | ICD-10-CM | POA: Diagnosis not present

## 2016-12-29 DIAGNOSIS — M5441 Lumbago with sciatica, right side: Secondary | ICD-10-CM | POA: Diagnosis not present

## 2016-12-29 DIAGNOSIS — G8929 Other chronic pain: Secondary | ICD-10-CM

## 2016-12-29 NOTE — Progress Notes (Signed)
Patient ID: Mckenna Gamm, female   DOB: Dec 11, 1955, 61 y.o.   MRN: [id number]  Chief Complaint  Patient presents with  . Follow-up    RT KNEE/BACK    61 year old female here for routine follow-up history of lumbar fusion L5-S1 with some right leg residual leg pain also has osteoarthritis right knee currently on meloxicam with stable symptoms   Review of Systems  Neurological: Negative for sensory change.    There were no vitals taken for this visit. Gen. appearance is normal grooming and hygiene normal Orientation to person place and time normal Mood normal   Ortho Exam Bilateral leg flexion 125 full extension no tenderness or swelling both knee stable with some palpable pain over the right lateral leg and thigh down to the knee  A/P  Medical decision-making  Encounter Diagnoses  Name Primary?  . 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 S/P right knee arthroscopy      No orders of the defined types were placed in this encounter.   [place], MD 12/29/2016 10:17 AM

## 2017-01-05 ENCOUNTER — Ambulatory Visit: Payer: BLUE CROSS/BLUE SHIELD | Admitting: Orthopedic Surgery

## 2017-01-27 ENCOUNTER — Other Ambulatory Visit: Payer: Self-pay | Admitting: Obstetrics and Gynecology

## 2017-01-27 DIAGNOSIS — Z1231 Encounter for screening mammogram for malignant neoplasm of breast: Secondary | ICD-10-CM

## 2017-01-27 DIAGNOSIS — Z01411 Encounter for gynecological examination (general) (routine) with abnormal findings: Secondary | ICD-10-CM | POA: Diagnosis not present

## 2017-01-27 DIAGNOSIS — Z01419 Encounter for gynecological examination (general) (routine) without abnormal findings: Secondary | ICD-10-CM | POA: Diagnosis not present

## 2017-01-27 DIAGNOSIS — N952 Postmenopausal atrophic vaginitis: Secondary | ICD-10-CM | POA: Diagnosis not present

## 2017-02-27 ENCOUNTER — Other Ambulatory Visit (HOSPITAL_COMMUNITY): Payer: Self-pay | Admitting: Obstetrics and Gynecology

## 2017-02-27 ENCOUNTER — Ambulatory Visit (HOSPITAL_COMMUNITY)
Admission: RE | Admit: 2017-02-27 | Discharge: 2017-02-27 | Disposition: A | Discharge status: Home / Self Care | Payer: BLUE CROSS/BLUE SHIELD | Source: Ambulatory Visit | Attending: Obstetrics and Gynecology | Admitting: Obstetrics and Gynecology

## 2017-02-27 ENCOUNTER — Ambulatory Visit: Payer: BLUE CROSS/BLUE SHIELD

## 2017-02-27 DIAGNOSIS — Z1231 Encounter for screening mammogram for malignant neoplasm of breast: Secondary | ICD-10-CM

## 2017-03-30 DIAGNOSIS — E782 Mixed hyperlipidemia: Secondary | ICD-10-CM | POA: Diagnosis not present

## 2017-03-30 DIAGNOSIS — Z6841 Body Mass Index (BMI) 40.0 and over, adult: Secondary | ICD-10-CM | POA: Diagnosis not present

## 2017-03-30 DIAGNOSIS — E119 Type 2 diabetes mellitus without complications: Secondary | ICD-10-CM | POA: Diagnosis not present

## 2017-03-30 DIAGNOSIS — Z0001 Encounter for general adult medical examination with abnormal findings: Secondary | ICD-10-CM | POA: Diagnosis not present

## 2017-03-30 DIAGNOSIS — Z1389 Encounter for screening for other disorder: Secondary | ICD-10-CM | POA: Diagnosis not present

## 2017-03-30 DIAGNOSIS — I10 Essential (primary) hypertension: Secondary | ICD-10-CM | POA: Diagnosis not present

## 2017-06-30 ENCOUNTER — Ambulatory Visit: Payer: BLUE CROSS/BLUE SHIELD | Admitting: Orthopedic Surgery

## 2017-07-06 ENCOUNTER — Ambulatory Visit (INDEPENDENT_AMBULATORY_CARE_PROVIDER_SITE_OTHER): Payer: BLUE CROSS/BLUE SHIELD

## 2017-07-06 ENCOUNTER — Ambulatory Visit: Payer: BLUE CROSS/BLUE SHIELD | Admitting: Orthopedic Surgery

## 2017-07-06 VITALS — BP 114/77 | HR 68 | Ht 67.5 in | Wt 300.0 lb

## 2017-07-06 DIAGNOSIS — M1710 Unilateral primary osteoarthritis, unspecified knee: Secondary | ICD-10-CM

## 2017-07-06 DIAGNOSIS — Z1211 Encounter for screening for malignant neoplasm of colon: Secondary | ICD-10-CM | POA: Diagnosis not present

## 2017-07-06 NOTE — Progress Notes (Signed)
Progress Note   Patient ID: Meagan Reyes, female   DOB: October 09, 1955, 61 y.o.   MRN: [id number]  Chief Complaint  Patient presents with  . Follow-up    61 year old female followed for knee arthritis.  Today her right knee is most symptomatic although she has bilateral arthritis  She is here for reevaluation.  Currently she complains of burning popping or pain in the front of her knee and pain in the back of her knee when she is lying in extension she is status post lumbar fusion  She says she can tolerate the pain she is currently on meloxicam  Location right knee primarily lateral side quality burning severity moderate duration all chronic long-term timing appears to be worse with weightbearing context worsening     Review of Systems  Constitutional: Negative for chills, fever and weight loss.  Respiratory: Negative for shortness of breath.   Cardiovascular: Negative for chest pain.  Musculoskeletal: Positive for myalgias.   No outpatient medications have been marked as taking for the 07/06/17 encounter (Office Visit) with [name], MD.   Current Facility-Administered Medications for the 07/06/17 encounter (Office Visit) with [name], MD  Medication  . nortriptyline (PAMELOR) capsule 10 mg    Past Medical History:  Diagnosis Date  . Hyperlipemia   . Hypertension      Allergies  Allergen Reactions  . Neurontin [Gabapentin] Rash    BP 114/77   Pulse 68   Ht 5' 7.5" (1.715 m)   Wt 300 lb (136.1 kg)   BMI 46.29 kg/m    Physical Exam  Constitutional: She is oriented to person, place, and time. She appears well-developed and well-nourished.  Musculoskeletal:       Right knee: She exhibits abnormal alignment and bony tenderness. She exhibits normal range of motion, no swelling, no effusion, no ecchymosis, no deformity, no laceration, no erythema, no LCL laxity, normal patellar mobility and no MCL laxity. Tenderness found. Lateral joint line  tenderness noted. No patellar tendon tenderness noted.       Left knee: She exhibits normal range of motion, no swelling, no effusion, no ecchymosis, no deformity, no laceration, no LCL laxity and no MCL laxity. Tenderness found. Lateral joint line tenderness noted.       Legs: Gait unsupported slightly favors her right knee  Neurological: She is alert and oriented to person, place, and time.  Psychiatric: She has a normal mood and affect. Judgment normal.  Vitals reviewed.      Medical decision-making  Imaging: Please see dictated report x-ray shows worsening disease in the  Encounter Diagnosis  Name Primary?  Marland Kitchen Arthritis of knee Yes    Continue meloxicam  Inject right knee  Work on weight loss  BMI needs to be 40 is 46  Procedure note right knee injection verbal consent was obtained to inject right knee joint  Timeout was completed to confirm the site of injection  The medications used were 40 mg of Depo-Medrol and 1% lidocaine 3 cc  Anesthesia was provided by ethyl chloride and the skin was prepped with alcohol.  After cleaning the skin with alcohol a 20-gauge needle was used to inject the right knee joint. There were no complications. A sterile bandage was applied.      Arther Abbott, MD 07/06/2017 2:58 PM

## 2017-07-06 NOTE — Patient Instructions (Signed)
Continue meloxicam  Weight loss Exercises   You have received an injection of steroids into the joint. 15% of patients will have increased pain within the 24 hours postinjection.   This is transient and will go away.   We recommend that you use ice packs on the injection site for 20 minutes every 2 hours and extra strength Tylenol 2 tablets every 8 as needed until the pain resolves.  If you continue to have pain after taking the Tylenol and using the ice please call the office for further instructions.

## 2017-08-04 DIAGNOSIS — L509 Urticaria, unspecified: Secondary | ICD-10-CM | POA: Diagnosis not present

## 2017-08-11 DIAGNOSIS — L509 Urticaria, unspecified: Secondary | ICD-10-CM | POA: Diagnosis not present

## 2017-08-13 DIAGNOSIS — S134XXA Sprain of ligaments of cervical spine, initial encounter: Secondary | ICD-10-CM | POA: Diagnosis not present

## 2017-08-13 DIAGNOSIS — S233XXA Sprain of ligaments of thoracic spine, initial encounter: Secondary | ICD-10-CM | POA: Diagnosis not present

## 2017-08-13 DIAGNOSIS — M545 Low back pain: Secondary | ICD-10-CM | POA: Diagnosis not present

## 2017-08-13 DIAGNOSIS — M47816 Spondylosis without myelopathy or radiculopathy, lumbar region: Secondary | ICD-10-CM | POA: Diagnosis not present

## 2017-08-18 DIAGNOSIS — M47816 Spondylosis without myelopathy or radiculopathy, lumbar region: Secondary | ICD-10-CM | POA: Diagnosis not present

## 2017-08-18 DIAGNOSIS — S233XXA Sprain of ligaments of thoracic spine, initial encounter: Secondary | ICD-10-CM | POA: Diagnosis not present

## 2017-08-18 DIAGNOSIS — M545 Low back pain: Secondary | ICD-10-CM | POA: Diagnosis not present

## 2017-08-18 DIAGNOSIS — S134XXA Sprain of ligaments of cervical spine, initial encounter: Secondary | ICD-10-CM | POA: Diagnosis not present

## 2017-08-21 DIAGNOSIS — M545 Low back pain: Secondary | ICD-10-CM | POA: Diagnosis not present

## 2017-08-21 DIAGNOSIS — S134XXA Sprain of ligaments of cervical spine, initial encounter: Secondary | ICD-10-CM | POA: Diagnosis not present

## 2017-08-21 DIAGNOSIS — M47816 Spondylosis without myelopathy or radiculopathy, lumbar region: Secondary | ICD-10-CM | POA: Diagnosis not present

## 2017-08-21 DIAGNOSIS — S233XXA Sprain of ligaments of thoracic spine, initial encounter: Secondary | ICD-10-CM | POA: Diagnosis not present

## 2017-08-24 DIAGNOSIS — L508 Other urticaria: Secondary | ICD-10-CM | POA: Diagnosis not present

## 2017-08-25 DIAGNOSIS — S134XXA Sprain of ligaments of cervical spine, initial encounter: Secondary | ICD-10-CM | POA: Diagnosis not present

## 2017-08-25 DIAGNOSIS — M47816 Spondylosis without myelopathy or radiculopathy, lumbar region: Secondary | ICD-10-CM | POA: Diagnosis not present

## 2017-08-25 DIAGNOSIS — M545 Low back pain: Secondary | ICD-10-CM | POA: Diagnosis not present

## 2017-08-25 DIAGNOSIS — S233XXA Sprain of ligaments of thoracic spine, initial encounter: Secondary | ICD-10-CM | POA: Diagnosis not present

## 2017-09-12 IMAGING — MR MR KNEE*R* W/O CM
5 of 8 series · 22 of 40 positions shown · non-contrast
Comparison: Radiographs dated 09/18/2015

CLINICAL DATA: Right knee pain since a fall at home 2 months ago.

EXAM:
MRI OF THE RIGHT KNEE WITHOUT CONTRAST
TECHNIQUE: Multiplanar, multisequence MR imaging of the knee was performed. No
intravenous contrast was administered.

[Series 3: pdfs axial · axial · 3.0mm · 0.22mm/px · z∈[-48,+57]mm · 5 of 30 slices shown]
[im 1/30]
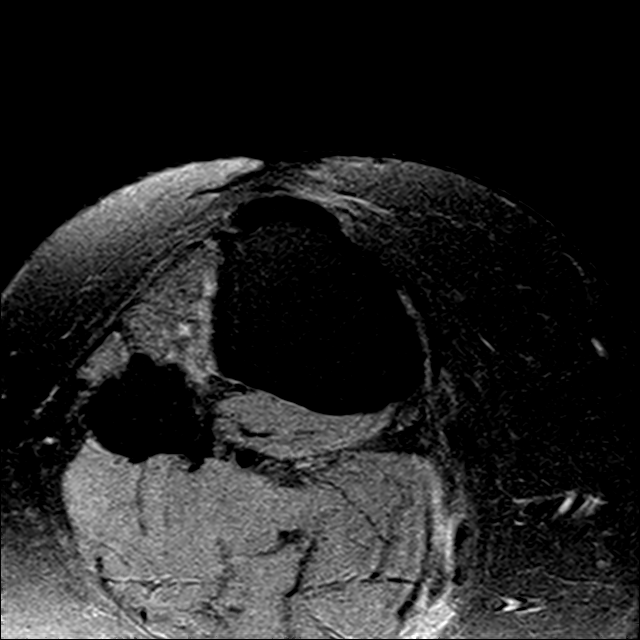
[im 8/30]
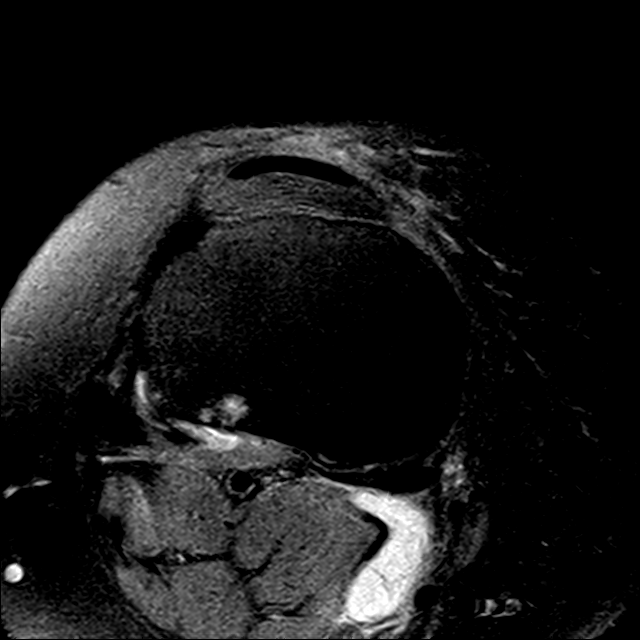
[im 15/30]
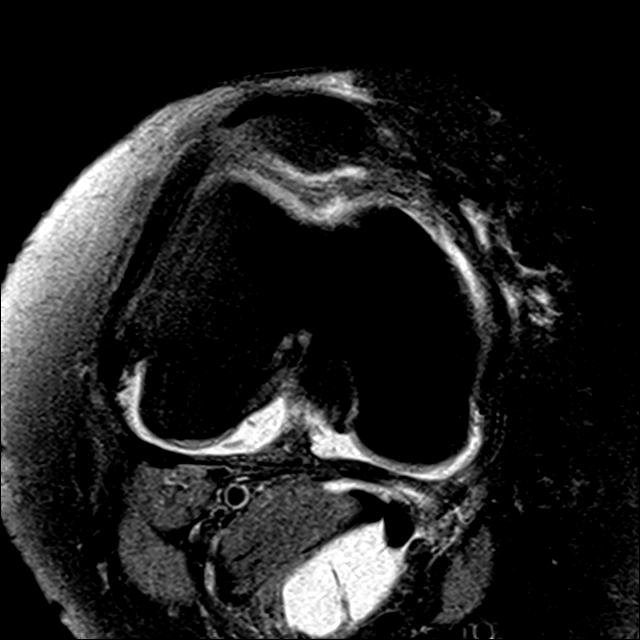
[im 22/30]
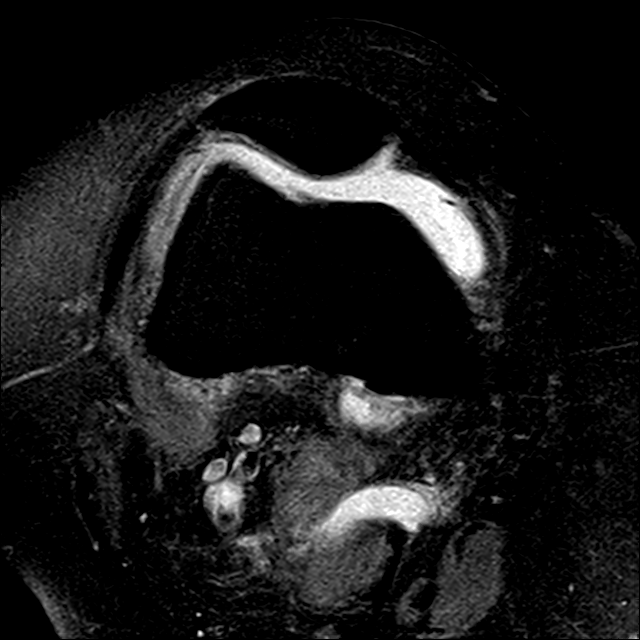
[im 30/30]
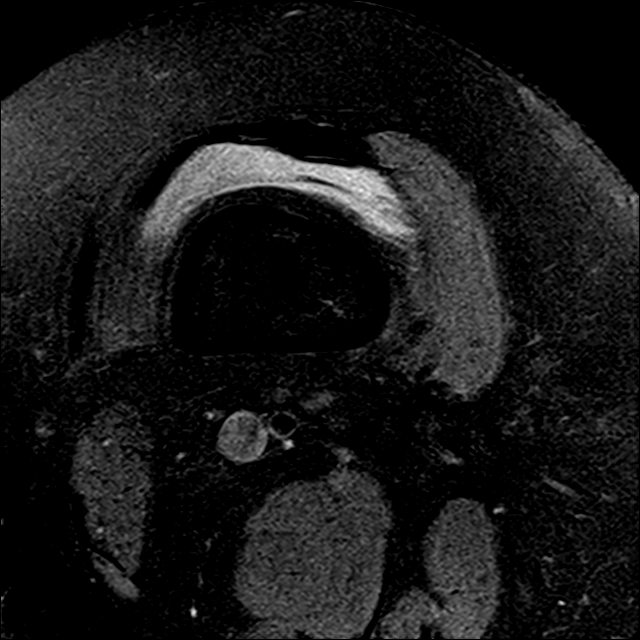

[Series 4: pdfs sag blade · sagittal · 3.0mm · 0.53mm/px · 6 of 29 slices shown]
[im 1/29]
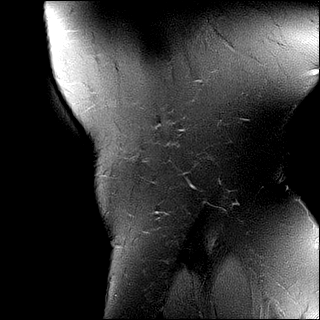
[im 6/29]
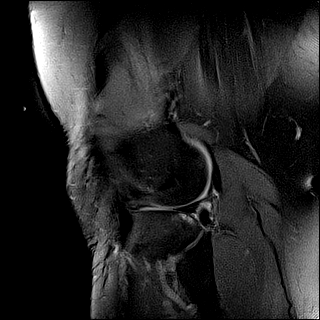
[im 12/29]
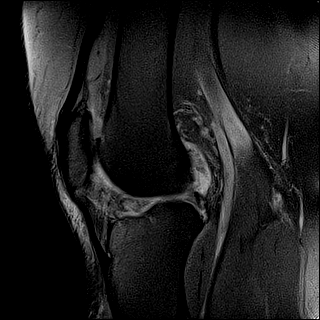
[im 17/29]
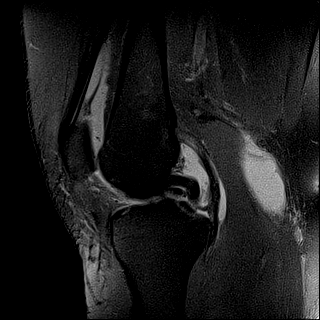
[im 23/29]
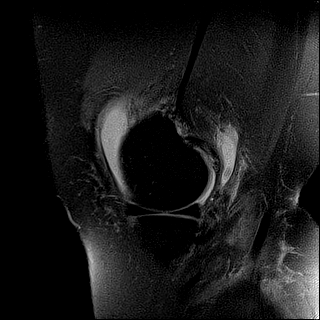
[im 29/29]
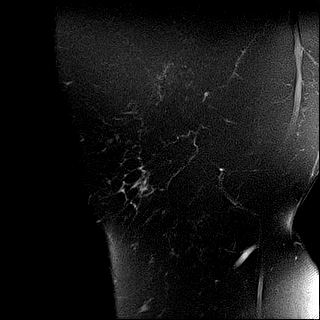

[Series 5: T1 · coronal · 3.0mm · 0.23mm/px · 5 of 26 slices shown]
[im 1/26]
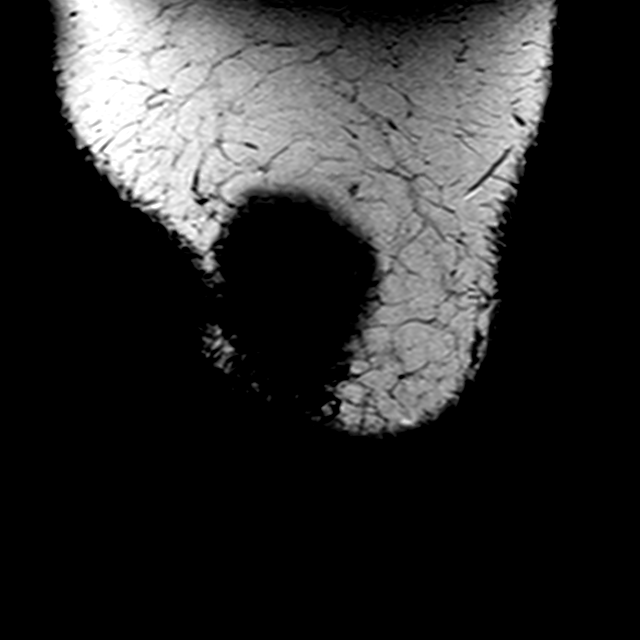
[im 7/26]
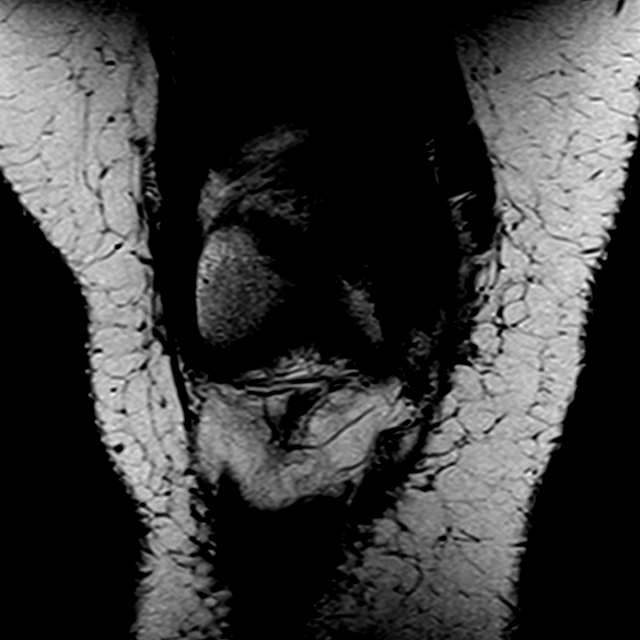
[im 13/26]
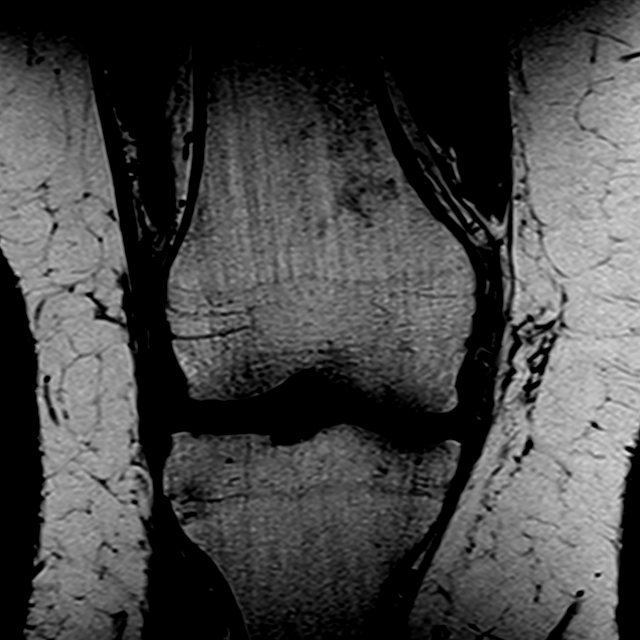
[im 19/26]
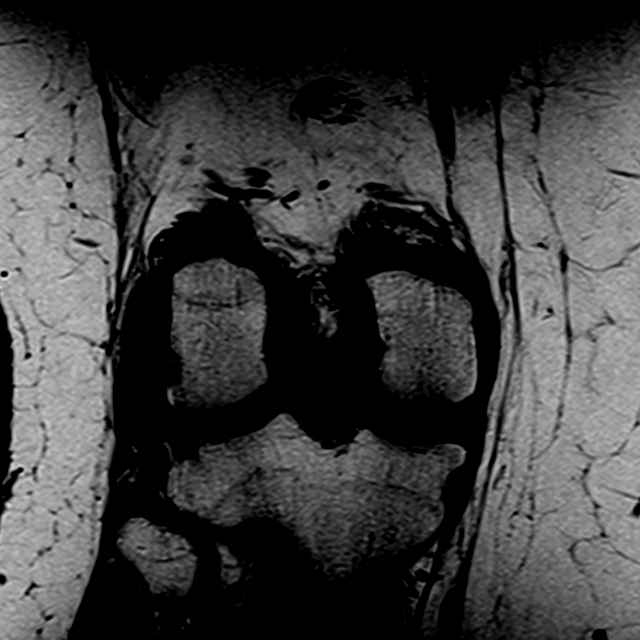
[im 26/26]
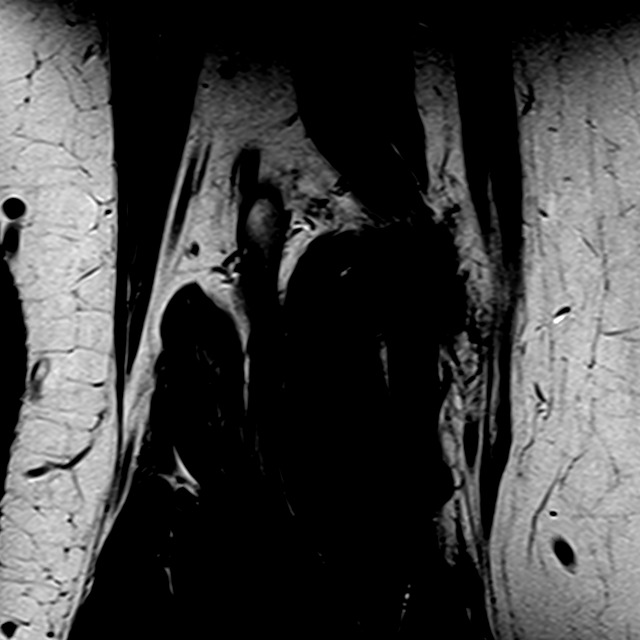

[Series 6: t2fs cor blade · coronal · 3.0mm · 0.49mm/px · 5 of 26 slices shown]
[im 1/26]
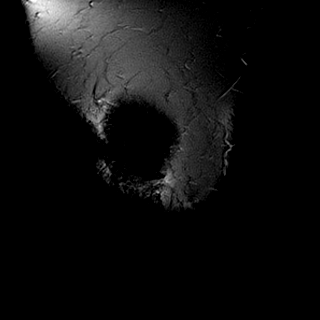
[im 7/26]
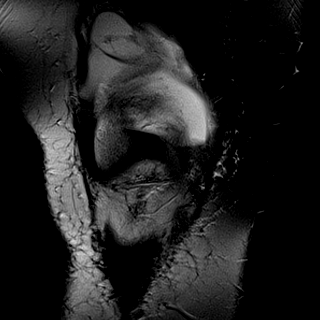
[im 13/26]
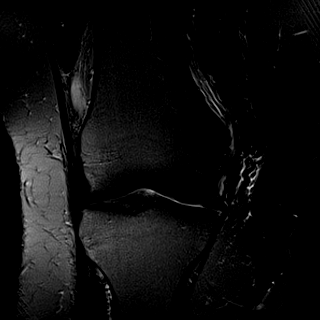
[im 19/26]
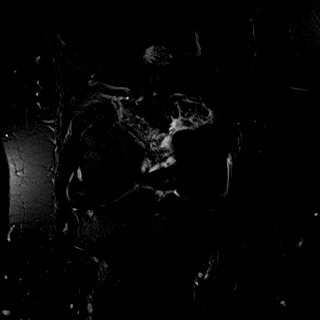
[im 26/26]
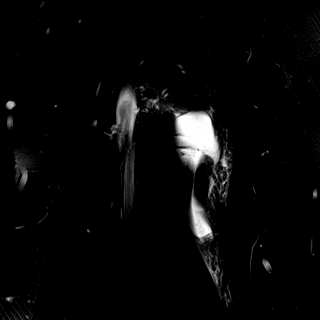

[Series 7: pdfs cor blade · coronal · 3.0mm · 0.60mm/px · 1 of 28 slices shown]
[im 1/28]
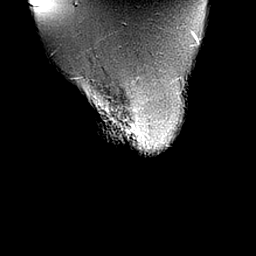

[22 of 40 positions shown; findings below may reference images not displayed]

FINDINGS: MENISCI

Medial meniscus:  Normal.

Lateral meniscus: There is a subtle oblique horizontal tear of the
midbody best seen on images 5 and 6 of series 8 with extensive
intrinsic degeneration of the anterior horn with an intrameniscal
cyst at the anterior attachment.

LIGAMENTS

Cruciates:  Normal.

Collaterals:  Normal.

CARTILAGE

Patellofemoral:  Normal.

Medial:  Normal.

Lateral: Focal grade 4 chondromalacia of the posterior periphery of
tibial plateau with a narrow full-thickness fissure in the femoral
condyle.

Joint: Moderate joint effusion. Multiple small loose bodies in the
posterior central aspect of the joint.

Popliteal Fossa:  6 x 3 cm Baker's cyst.

Extensor Mechanism:  Normal.

Bones:  The extra-articular bones are normal.
IMPRESSION: Arthritic changes of the lateral compartment with degeneration of
the midbody and anterior horn of the lateral meniscus with a focal
tear in the midbody.

Moderate joint effusion with multiple loose bodies in the joint.

## 2017-11-24 ENCOUNTER — Encounter: Payer: Self-pay | Admitting: Orthopedic Surgery

## 2017-11-24 ENCOUNTER — Ambulatory Visit: Payer: BLUE CROSS/BLUE SHIELD | Admitting: Orthopedic Surgery

## 2017-11-24 VITALS — BP 121/69 | HR 60 | Ht 68.0 in | Wt 313.0 lb

## 2017-11-24 DIAGNOSIS — M1711 Unilateral primary osteoarthritis, right knee: Secondary | ICD-10-CM

## 2017-11-24 DIAGNOSIS — M1710 Unilateral primary osteoarthritis, unspecified knee: Secondary | ICD-10-CM

## 2017-11-24 NOTE — Progress Notes (Addendum)
Chief Complaint  Patient presents with  . Knee Pain    right     Procedure note right knee injection verbal consent was obtained to inject right knee joint  Timeout was completed to confirm the site of injection  The medications used were 40 mg of Depo-Medrol and 1% lidocaine 3 cc  Anesthesia was provided by ethyl chloride and the skin was prepped with alcohol.  After cleaning the skin with alcohol a 20-gauge needle was used to inject the right knee joint. There were no complications. A sterile bandage was applied.  Encounter Diagnoses  Name Primary?  Marland Kitchen Arthritis of knee Yes  . Primary osteoarthritis of right knee

## 2018-01-05 DIAGNOSIS — L509 Urticaria, unspecified: Secondary | ICD-10-CM | POA: Diagnosis not present

## 2018-01-05 DIAGNOSIS — L299 Pruritus, unspecified: Secondary | ICD-10-CM | POA: Diagnosis not present

## 2018-01-12 ENCOUNTER — Encounter: Payer: Self-pay | Admitting: Orthopedic Surgery

## 2018-01-12 ENCOUNTER — Ambulatory Visit: Payer: BLUE CROSS/BLUE SHIELD | Admitting: Orthopedic Surgery

## 2018-01-12 ENCOUNTER — Ambulatory Visit (INDEPENDENT_AMBULATORY_CARE_PROVIDER_SITE_OTHER): Payer: BLUE CROSS/BLUE SHIELD

## 2018-01-12 VITALS — BP 125/66 | HR 60 | Ht 68.0 in

## 2018-01-12 DIAGNOSIS — M1710 Unilateral primary osteoarthritis, unspecified knee: Secondary | ICD-10-CM

## 2018-01-12 DIAGNOSIS — M1711 Unilateral primary osteoarthritis, right knee: Secondary | ICD-10-CM | POA: Diagnosis not present

## 2018-01-12 NOTE — Progress Notes (Signed)
Progress Note   Patient ID: Meagan Reyes, female   DOB: August 27, 1955, 62 y.o.   MRN: [id number]   Chief Complaint  Patient presents with  . Knee Pain    right     62 year old female followed for osteoarthritis of the right knee received an injection last visit got good results.  She started to stay active she is trying to keep her weight down    Review of Systems  Musculoskeletal: Positive for joint pain.  Neurological: Negative for tingling.     Allergies  Allergen Reactions  . Neurontin [Gabapentin] Rash     BP 125/66   Pulse 60   Ht 5\' 8"  (1.727 m)   BMI 47.59 kg/m   Physical Exam  Constitutional: She is oriented to person, place, and time. She appears well-developed and well-nourished.  Musculoskeletal:       Legs: Neurological: She is alert and oriented to person, place, and time.  Psychiatric: She has a normal mood and affect. Judgment normal.  Vitals reviewed.    Medical decisions:   Data  Imaging:   Previous images taken on December 2018 valgus osteoarthritis of the right knee today's x-ray shows similar findings with no worsening of the joint space narrowing valgus angle is 10 degrees please see dictated report  Encounter Diagnosis  Name Primary?  Marland Kitchen Arthritis of knee Yes    PLAN:   Continue activity as tolerated Continue weight loss methods Follow-up 6 months x-ray    Arther Abbott, MD 01/12/2018 9:24 AM

## 2018-02-04 DIAGNOSIS — Z6841 Body Mass Index (BMI) 40.0 and over, adult: Secondary | ICD-10-CM | POA: Diagnosis not present

## 2018-02-04 DIAGNOSIS — E782 Mixed hyperlipidemia: Secondary | ICD-10-CM | POA: Diagnosis not present

## 2018-02-04 DIAGNOSIS — E119 Type 2 diabetes mellitus without complications: Secondary | ICD-10-CM | POA: Diagnosis not present

## 2018-02-04 DIAGNOSIS — Z1389 Encounter for screening for other disorder: Secondary | ICD-10-CM | POA: Diagnosis not present

## 2018-02-04 DIAGNOSIS — I10 Essential (primary) hypertension: Secondary | ICD-10-CM | POA: Diagnosis not present

## 2018-03-04 DIAGNOSIS — Z23 Encounter for immunization: Secondary | ICD-10-CM | POA: Diagnosis not present

## 2018-03-04 DIAGNOSIS — Z681 Body mass index (BMI) 19 or less, adult: Secondary | ICD-10-CM | POA: Diagnosis not present

## 2018-03-04 DIAGNOSIS — K760 Fatty (change of) liver, not elsewhere classified: Secondary | ICD-10-CM | POA: Diagnosis not present

## 2018-03-04 DIAGNOSIS — E119 Type 2 diabetes mellitus without complications: Secondary | ICD-10-CM | POA: Diagnosis not present

## 2018-03-04 DIAGNOSIS — Z1389 Encounter for screening for other disorder: Secondary | ICD-10-CM | POA: Diagnosis not present

## 2018-03-04 DIAGNOSIS — Z0001 Encounter for general adult medical examination with abnormal findings: Secondary | ICD-10-CM | POA: Diagnosis not present

## 2018-03-09 ENCOUNTER — Encounter: Payer: Self-pay | Admitting: Nurse Practitioner

## 2018-03-09 ENCOUNTER — Other Ambulatory Visit (HOSPITAL_COMMUNITY): Payer: Self-pay | Admitting: Family Medicine

## 2018-03-09 DIAGNOSIS — Z1231 Encounter for screening mammogram for malignant neoplasm of breast: Secondary | ICD-10-CM

## 2018-03-12 DIAGNOSIS — Z01411 Encounter for gynecological examination (general) (routine) with abnormal findings: Secondary | ICD-10-CM | POA: Diagnosis not present

## 2018-03-12 DIAGNOSIS — N9089 Other specified noninflammatory disorders of vulva and perineum: Secondary | ICD-10-CM | POA: Diagnosis not present

## 2018-03-15 ENCOUNTER — Encounter (HOSPITAL_COMMUNITY): Payer: Self-pay

## 2018-03-15 ENCOUNTER — Ambulatory Visit (HOSPITAL_COMMUNITY)
Admission: RE | Admit: 2018-03-15 | Discharge: 2018-03-15 | Disposition: A | Discharge status: Home / Self Care | Payer: BLUE CROSS/BLUE SHIELD | Source: Ambulatory Visit | Attending: Family Medicine | Admitting: Family Medicine

## 2018-03-15 DIAGNOSIS — Z1231 Encounter for screening mammogram for malignant neoplasm of breast: Secondary | ICD-10-CM | POA: Insufficient documentation

## 2018-03-17 DIAGNOSIS — R1310 Dysphagia, unspecified: Secondary | ICD-10-CM | POA: Diagnosis not present

## 2018-03-17 DIAGNOSIS — J351 Hypertrophy of tonsils: Secondary | ICD-10-CM | POA: Diagnosis not present

## 2018-03-17 DIAGNOSIS — J358 Other chronic diseases of tonsils and adenoids: Secondary | ICD-10-CM | POA: Diagnosis not present

## 2018-03-17 DIAGNOSIS — G4730 Sleep apnea, unspecified: Secondary | ICD-10-CM | POA: Diagnosis not present

## 2018-03-31 DIAGNOSIS — H5203 Hypermetropia, bilateral: Secondary | ICD-10-CM | POA: Diagnosis not present

## 2018-03-31 DIAGNOSIS — D3130 Benign neoplasm of unspecified choroid: Secondary | ICD-10-CM | POA: Diagnosis not present

## 2018-03-31 DIAGNOSIS — H2513 Age-related nuclear cataract, bilateral: Secondary | ICD-10-CM | POA: Diagnosis not present

## 2018-03-31 DIAGNOSIS — E119 Type 2 diabetes mellitus without complications: Secondary | ICD-10-CM | POA: Diagnosis not present

## 2018-04-15 DIAGNOSIS — Z6841 Body Mass Index (BMI) 40.0 and over, adult: Secondary | ICD-10-CM | POA: Diagnosis not present

## 2018-04-15 DIAGNOSIS — Z1389 Encounter for screening for other disorder: Secondary | ICD-10-CM | POA: Diagnosis not present

## 2018-04-15 DIAGNOSIS — R945 Abnormal results of liver function studies: Secondary | ICD-10-CM | POA: Diagnosis not present

## 2018-04-15 DIAGNOSIS — E119 Type 2 diabetes mellitus without complications: Secondary | ICD-10-CM | POA: Diagnosis not present

## 2018-05-12 DIAGNOSIS — Z23 Encounter for immunization: Secondary | ICD-10-CM | POA: Diagnosis not present

## 2018-05-19 ENCOUNTER — Encounter: Payer: Self-pay | Admitting: Orthopedic Surgery

## 2018-05-19 ENCOUNTER — Ambulatory Visit: Payer: BLUE CROSS/BLUE SHIELD | Admitting: Orthopedic Surgery

## 2018-05-19 VITALS — BP 121/74 | HR 68 | Ht 68.0 in | Wt 308.0 lb

## 2018-05-19 DIAGNOSIS — M1710 Unilateral primary osteoarthritis, unspecified knee: Secondary | ICD-10-CM

## 2018-05-19 DIAGNOSIS — M1711 Unilateral primary osteoarthritis, right knee: Secondary | ICD-10-CM | POA: Diagnosis not present

## 2018-05-19 DIAGNOSIS — Z6841 Body Mass Index (BMI) 40.0 and over, adult: Secondary | ICD-10-CM

## 2018-05-19 MED ORDER — MELOXICAM 15 MG PO TABS: 15.0000 mg | ORAL_TABLET | Freq: Every day | ORAL | 2 refills | Status: DC

## 2018-05-19 NOTE — Progress Notes (Signed)
Chief Complaint  Patient presents with  . Injections    Right knee    BP 121/74   Pulse 68   Ht 5\' 8"  (1.727 m)   Wt (!) 308 lb (139.7 kg)   BMI 46.83 kg/m   Encounter Diagnosis  Name Primary?  . Primary localized osteoarthritis of knee Yes    Procedure note right knee injection verbal consent was obtained to inject right knee joint  Timeout was completed to confirm the site of injection  The medications used were 40 mg of Depo-Medrol and 1% lidocaine 3 cc  Anesthesia was provided by ethyl chloride and the skin was prepped with alcohol.  After cleaning the skin with alcohol a 20-gauge needle was used to inject the right knee joint. There were no complications. A sterile bandage was applied.  Meds ordered this encounter  Medications  . meloxicam (MOBIC) 15 MG tablet    Sig: Take 1 tablet (15 mg total) by mouth daily.    Dispense:  90 tablet    Refill:  2    The patient meets the AMA guidelines for Morbid (severe) obesity with a BMI > 40.0 and I have recommended weight loss.

## 2018-05-24 ENCOUNTER — Encounter

## 2018-05-24 ENCOUNTER — Ambulatory Visit: Payer: BLUE CROSS/BLUE SHIELD | Admitting: Orthopedic Surgery

## 2018-06-04 DIAGNOSIS — Z23 Encounter for immunization: Secondary | ICD-10-CM | POA: Diagnosis not present

## 2018-06-14 ENCOUNTER — Ambulatory Visit: Payer: BLUE CROSS/BLUE SHIELD | Admitting: Nurse Practitioner

## 2018-06-16 ENCOUNTER — Ambulatory Visit: Payer: BLUE CROSS/BLUE SHIELD | Admitting: Allergy & Immunology

## 2018-06-16 ENCOUNTER — Encounter: Payer: Self-pay | Admitting: Allergy & Immunology

## 2018-06-16 VITALS — BP 118/70 | HR 88 | Temp 98.0°F | Resp 18 | Ht 68.0 in | Wt 310.0 lb

## 2018-06-16 DIAGNOSIS — L508 Other urticaria: Secondary | ICD-10-CM

## 2018-06-16 MED ORDER — MONTELUKAST SODIUM 10 MG PO TABS: 10.0000 mg | ORAL_TABLET | Freq: Every day | ORAL | 5 refills | Status: DC

## 2018-06-16 MED ORDER — FAMOTIDINE 20 MG PO TABS: 20.0000 mg | ORAL_TABLET | Freq: Two times a day (BID) | ORAL | 5 refills | Status: DC

## 2018-06-16 MED ORDER — CETIRIZINE HCL 10 MG PO TABS: 10.0000 mg | ORAL_TABLET | Freq: Every day | ORAL | 5 refills | Status: DC

## 2018-06-16 NOTE — Patient Instructions (Addendum)
1. Chronic urticaria - Your history does not have any "red flags" such as fevers, joint pains, or permanent skin changes that would be concerning for a more serious cause of hives.  - We will get some labs to rule out serious causes of hives: alpha gal panel, complete blood count, tryptase level, chronic urticaria panel, CMP, ESR, and CRP. - Chronic hives are often times a self limited process and will "burn themselves out" over 6-12 months, although this is not always the case.  - In the meantime, start suppressive dosing of antihistamines:   - Morning: Allegra (fexofenadine) 3109m (two tablets)   - Evening: Zyrtec (cetirizine) 261m(two tablets) + Singulair (montelukast) 1076m Pepcid (famotidine) 28m16mghtly  2. Return in about 3 months (around 09/16/2018).   Please inform us o[place]any Emergency Department visits, hospitalizations, or changes in symptoms. Call us b[place]oreaore going to the ED for breathing or allergy symptoms since we might be able to fit you in for a sick visit. Feel free to contact us a[place]oreatime with any questions, problems, or concerns.  It was a pleasure to see you again today!  Websites that have reliable patient information: 1. American Academy of Asthma, Allergy, and Immunology: www.aaaai.org 2. Food Allergy Research and Education (FARE): foodallergy.org 3. Mothers of Asthmatics: http://www.asthmacommunitynetwork.org 4. American College of Allergy, Asthma, and Immunology: www.MonthlyElectricBill.co.ukake sure you are registered to vote! If you have moved or changed any of your contact information, you will need to get this updated before voting!

## 2018-06-16 NOTE — Progress Notes (Signed)
NEW PATIENT  Date of Service/Encounter:  06/16/18  Referring provider: Redmond School, MD   Assessment:   Chronic idiopathic urticaria  Plan/Recommendations:   1. Chronic urticaria - Your history does not have any "red flags" such as fevers, joint pains, or permanent skin changes that would be concerning for a more serious cause of hives.  - We will get some labs to rule out serious causes of hives: alpha gal panel, complete blood count, tryptase level, chronic urticaria panel, CMP, ESR, and CRP. - Chronic hives are often times a self limited process and will "burn themselves out" over 6-12 months, although this is not always the case.  - In the meantime, start suppressive dosing of antihistamines:   - Morning: Allegra (fexofenadine) 329m (two tablets)   - Evening: Zyrtec (cetirizine) 273m(two tablets) + Singulair (montelukast) 1080m Pepcid (famotidine) 52m31mghtly  2. Return in about 3 months (around 09/16/2018).  Subjective:   Meagan Reyes 62 y36. female presenting today for evaluation of  Chief Complaint  Patient presents with  . Urticaria    flu shot     Meagan Reyes Six a history of the following: Patient Active Problem List   Diagnosis Date Noted  . Lateral meniscus tear   . Chondromalacia, patella   . Arthritis of right knee     History obtained from: chart review and patient.  SusaLourdes Kucharski referred by FuscRedmond School.     Meagan Reyes 62 y43. female presenting for an evaluation of hives. She has hives all of the time. The most recent episode was earlier in November when she got her flu shot. Sometimes she will get them with exposure to heat. 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 She does have them when she goes to [place] chairs for instance.   She does not think that stress is related to it, although she  did go through a lot of stress with her first husband. In total she has had hives for decades. She did have testing that was positive to cats, dogs, trees, and dust. This was 30+ years ago. She did undergo allergy shots for a number of years. She stopped when she was pregnant and then never started.   She does think that shrimp might be related but she can eat it sometimes without a problem. She has tried to find triggers but there is nothing consistent. She does eat peanuts, some tree nuts, seafood, cow's milk, wheat, and other major allergens, although she does not eat sesame seeds, and soy.   She does have rhinorrhea during the cold seasons with exposure to cold. She denies sneezing or itchy watery eyes. She has seen Dr. BeavModena Nunnery her hives and was placed on cetirizine BID and Atarax as needed. This does not help much at all. She did take two cetirizine on Sunday as well as on Atarax.  She has never had a biopsy.  Her lesions do not cause any permanent skin changes.  She has no associated fevers or joint pains with this.  She does have a history of osteoarthritis and uses an herbal medication to treat this.  Otherwise, there is no history of other atopic diseases, including asthma, drug allergies, stinging insect allergies, eczema or urticaria. There is no significant infectious history. Vaccinations are up to date.  Past Medical History: Patient Active Problem List   Diagnosis Date Noted  . Lateral meniscus tear   . Chondromalacia, patella   . Arthritis of right knee     Medication List:  Allergies as of 06/16/2018      Reactions   Neurontin [gabapentin] Rash      Medication List        Accurate as of 06/16/18  4:31 PM. Always use your most recent med list.          aspirin 81 MG chewable tablet Chew 81 mg by mouth daily. Takes in the evening   CALCIUM PO Take 1 tablet by mouth daily.   cetirizine 10 MG tablet Commonly known as:  ZYRTEC Take 1 tablet (10 mg total) by  mouth daily.   famotidine 20 MG tablet Commonly known as:  PEPCID Take 1 tablet (20 mg total) by mouth 2 (two) times daily.   Fish Oil 1000 MG Caps Take 2,000 capsules by mouth 2 (two) times daily.   furosemide 40 MG tablet Commonly known as:  LASIX   hydrOXYzine 25 MG tablet Commonly known as:  ATARAX/VISTARIL Take 25 mg by mouth as needed for itching.   ibuprofen 200 MG tablet Commonly known as:  ADVIL,MOTRIN Take 400-600 mg by mouth every 6 (six) hours as needed for moderate pain.   lisinopril-hydrochlorothiazide 20-12.5 MG tablet Commonly known as:  PRINZIDE,ZESTORETIC Take 2 tablets by mouth daily.   meloxicam 15 MG tablet Commonly known as:  MOBIC Take 1 tablet (15 mg total) by mouth daily.   montelukast 10 MG tablet Commonly known as:  SINGULAIR Take 1 tablet (10 mg total) by mouth at bedtime.   ranitidine 300 MG tablet Commonly known as:  ZANTAC Take 300 mg by mouth at bedtime.   SUPER B COMPLEX/C PO Take by mouth.   Turmeric 500 MG Caps Take by mouth.   WOMENS DAILY FORMULA PO Take 1 tablet by mouth daily.       Birth History: non-contributory  Developmental History: non-contributory.   Past Surgical History: Past Surgical History:  Procedure Laterality Date  . ABDOMINAL HYSTERECTOMY    . BACK SURGERY    . HEEL SPUR EXCISION    . KNEE ARTHROSCOPY WITH LATERAL MENISECTOMY Right 10/18/2015   Procedure: RIGHT KNEE ARTHROSCOPY WITH LATERAL MENISECTOMY;  Surgeon: [name], MD;  Location: [facility];  Service: Orthopedics;  Laterality: Right;  . TONSILLECTOMY       Family History: Family History  Problem Relation Age of Onset  . Stroke Mother   . Asthma Mother   . Atrial fibrillation Mother   . Leukemia Mother   . COPD Mother   . Cancer Father        lung     Social History: Tranesha lives at home with her husband.  She lives in a house that was built in the 1970s.  There is carpeting throughout the home.  They have electric heating  and central cooling.  There are no animals inside or outside of the home.  They do have dust mite covers on his bed and pillows. There is no tobacco exposure.  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 They have two children who are no longer in the home.     Review of Systems: a 14-point review of systems is pertinent for what is mentioned in HPI.  Otherwise, all other systems were negative. Constitutional: negative other than that listed in the HPI Eyes: negative  other than that listed in the HPI Ears, nose, mouth, throat, and face: negative other than that listed in the HPI Respiratory: negative other than that listed in the HPI Cardiovascular: negative other than that listed in the HPI Gastrointestinal: negative other than that listed in the HPI Genitourinary: negative other than that listed in the HPI Integument: negative other than that listed in the HPI Hematologic: negative other than that listed in the HPI Musculoskeletal: negative other than that listed in the HPI Neurological: negative other than that listed in the HPI Allergy/Immunologic: negative other than that listed in the HPI    Objective:   Blood pressure 118/70, pulse 88, temperature 98 F (36.7 C), temperature source Oral, resp. rate 18, height '5\' 8"'  (1.727 m), weight (!) 310 lb (140.6 kg), SpO2 98 %. Body mass index is 47.14 kg/m.   Physical Exam:  General: Alert, interactive, in no acute distress. Pleasant female.  Eyes: No conjunctival injection bilaterally, no discharge on the right, no discharge on the left and no Horner-Trantas dots present. PERRL bilaterally. EOMI without pain. No photophobia.  Ears: Right TM pearly gray with normal light reflex, Left TM pearly gray with normal light reflex, Right TM intact without perforation and Left TM intact without perforation.  Nose/Throat: External nose within normal limits and septum midline. Turbinates edematous and pale with clear discharge.  Posterior oropharynx erythematous without cobblestoning in the posterior oropharynx. Tonsils 2+ without exudates.  Tongue without thrush. Neck: Supple without thyromegaly. Trachea midline. Adenopathy: no enlarged lymph nodes appreciated in the anterior cervical, occipital, axillary, epitrochlear, inguinal, or popliteal regions. Lungs: Clear to auscultation without wheezing, rhonchi or rales. No increased work of breathing. CV: Normal S1/S2. No murmurs. Capillary refill <2 seconds.  Abdomen: Nondistended, nontender. No guarding or rebound tenderness. Bowel sounds present in all fields and hypoactive  Skin: Warm and dry, without lesions or rashes. Extremities:  No clubbing, cyanosis or edema. Neuro:   Grossly intact. No focal deficits appreciated. Responsive to questions.  Diagnostic studies: none (deferred due to non-reactive histamine) .     Salvatore Marvel, MD [facility]

## 2018-06-21 ENCOUNTER — Other Ambulatory Visit: Payer: Self-pay

## 2018-06-21 MED ORDER — FAMOTIDINE 20 MG PO TABS: 20.0000 mg | ORAL_TABLET | Freq: Two times a day (BID) | ORAL | 1 refills | Status: DC

## 2018-06-22 LAB — IGE+ALLERGENS ZONE 2(30)
Alternaria Alternata IgE: <0.1 kU/L
Aspergillus Fumigatus IgE: <0.1 kU/L
Bahia Grass IgE: <0.1 kU/L
Bermuda Grass IgE: <0.1 kU/L
Cat Dander IgE: <0.1 kU/L
Cedar, Mountain IgE: <0.1 kU/L
Cockroach, American IgE: <0.1 kU/L
Common Silver Birch IgE: <0.1 kU/L
Dog Dander IgE: <0.1 kU/L
IGE (IMMUNOGLOBULIN E), SERUM: 32 [IU]/mL (ref 6–495)
Johnson Grass IgE: <0.1 kU/L
Maple/Box Elder IgE: <0.1 kU/L
Mucor Racemosus IgE: <0.1 kU/L
Mugwort IgE Qn: <0.1 kU/L
Oak, White IgE: <0.1 kU/L
Plantain, English IgE: <0.1 kU/L
Ragweed, Short IgE: <0.1 kU/L
Sheep Sorrel IgE Qn: <0.1 kU/L
Stemphylium Herbarum IgE: <0.1 kU/L
Sweet gum IgE RAST Ql: <0.1 kU/L
White Mulberry IgE: <0.1 kU/L

## 2018-06-22 LAB — CMP14+EGFR
ALK PHOS: 106 IU/L (ref 39–117)
ALT: 49 IU/L — AB (ref 0–32)
AST: 34 IU/L (ref 0–40)
Albumin/Globulin Ratio: 1.8 (ref 1.2–2.2)
Albumin: 4.2 g/dL (ref 3.6–4.8)
BUN/Creatinine Ratio: 28 (ref 12–28)
BUN: 22 mg/dL (ref 8–27)
Bilirubin Total: 0.3 mg/dL (ref 0.0–1.2)
CHLORIDE: 94 mmol/L — AB (ref 96–106)
CO2: 25 mmol/L (ref 20–29)
CREATININE: 0.78 mg/dL (ref 0.57–1.00)
Calcium: 9.3 mg/dL (ref 8.7–10.3)
GFR calc Af Amer: 95 mL/min/{1.73_m2} (ref >59)
GFR calc non Af Amer: 82 mL/min/{1.73_m2} (ref >59)
Globulin, Total: 2.4 g/dL (ref 1.5–4.5)
Glucose: 92 mg/dL (ref 65–99)
Potassium: 3.6 mmol/L (ref 3.5–5.2)
Sodium: 136 mmol/L (ref 134–144)
Total Protein: 6.6 g/dL (ref 6.0–8.5)

## 2018-06-22 LAB — ALLERGEN PROFILE, BASIC FOOD
Allergen Corn, IgE: <0.1 kU/L
Chocolate/Cacao IgE: <0.1 kU/L
Food Mix (Seafoods) IgE: NEGATIVE
Milk IgE: <0.1 kU/L
Peanut IgE: <0.1 kU/L
Wheat IgE: <0.1 kU/L

## 2018-06-22 LAB — CBC WITH DIFFERENTIAL/PLATELET
Basophils Absolute: 0 10*3/uL (ref 0.0–0.2)
Basos: 0 %
EOS (ABSOLUTE): 0.2 10*3/uL (ref 0.0–0.4)
Eos: 2 %
HEMOGLOBIN: 12.7 g/dL (ref 11.1–15.9)
Hematocrit: 37.7 % (ref 34.0–46.6)
Immature Grans (Abs): 0 10*3/uL (ref 0.0–0.1)
Immature Granulocytes: 0 %
LYMPHS ABS: 2.9 10*3/uL (ref 0.7–3.1)
Lymphs: 33 %
MCH: 28.7 pg (ref 26.6–33.0)
MCHC: 33.7 g/dL (ref 31.5–35.7)
MCV: 85 fL (ref 79–97)
MONOCYTES: 5 %
MONOS ABS: 0.4 10*3/uL (ref 0.1–0.9)
Neutrophils Absolute: 5.3 10*3/uL (ref 1.4–7.0)
Neutrophils: 60 %
PLATELETS: 255 10*3/uL (ref 150–450)
RBC: 4.43 x10E6/uL (ref 3.77–5.28)
RDW: 13.4 % (ref 12.3–15.4)
WBC: 8.9 10*3/uL (ref 3.4–10.8)

## 2018-06-22 LAB — ALPHA-GAL PANEL
Alpha Gal IgE*: <0.1 kU/L (ref <0.10)
Beef (Bos spp) IgE: <0.1 kU/L (ref <0.35)
Class Interpretation: 0
Class Interpretation: 0
PORK CLASS INTERPRETATION: 0
Pork (Sus spp) IgE: <0.1 kU/L (ref <0.35)

## 2018-06-22 LAB — C-REACTIVE PROTEIN: CRP: 6 mg/L (ref 0–10)

## 2018-06-22 LAB — THYROID ANTIBODIES
THYROID PEROXIDASE ANTIBODY: 16 [IU]/mL (ref 0–34)
Thyroglobulin Antibody: <1 IU/mL (ref 0.0–0.9)

## 2018-06-22 LAB — TRYPTASE: Tryptase: 8.6 ug/L (ref 2.2–13.2)

## 2018-06-22 LAB — SEDIMENTATION RATE: SED RATE: 22 mm/h (ref 0–40)

## 2018-06-22 LAB — CHRONIC URTICARIA: CU INDEX: 3.1 (ref <10)

## 2018-07-07 ENCOUNTER — Ambulatory Visit: Payer: BLUE CROSS/BLUE SHIELD | Admitting: Orthopedic Surgery

## 2018-07-07 ENCOUNTER — Encounter: Payer: Self-pay | Admitting: Orthopedic Surgery

## 2018-07-07 ENCOUNTER — Ambulatory Visit (INDEPENDENT_AMBULATORY_CARE_PROVIDER_SITE_OTHER): Payer: BLUE CROSS/BLUE SHIELD

## 2018-07-07 VITALS — BP 140/70 | HR 59 | Ht 68.0 in | Wt 313.0 lb

## 2018-07-07 DIAGNOSIS — Z6841 Body Mass Index (BMI) 40.0 and over, adult: Secondary | ICD-10-CM | POA: Diagnosis not present

## 2018-07-07 DIAGNOSIS — M1711 Unilateral primary osteoarthritis, right knee: Secondary | ICD-10-CM | POA: Diagnosis not present

## 2018-07-07 DIAGNOSIS — M1710 Unilateral primary osteoarthritis, unspecified knee: Secondary | ICD-10-CM

## 2018-07-07 NOTE — Patient Instructions (Signed)
Continue meloxicam Exercise as much as you can Try to keep the weight down

## 2018-07-07 NOTE — Progress Notes (Signed)
Progress Note   Patient ID: Meagan Reyes, female   DOB: [birth year], 62 y.o.   MRN: [id number]   Chief Complaint  Patient presents with  . Knee Pain    Right knee     62 years old comes in for routine follow-up currently on meloxicam 15 mg daily October 30 received cortisone injection right knee.  She started taking a new supplement called [place] Bilberry and says that it is helping her overall arthritis the injection helped her right knee the left knee significantly is better overall with the medication  41-month x-ray will be done today    Review of Systems  Musculoskeletal: Positive for joint pain.       Stiffness right knee after sitting     Allergies  Allergen Reactions  . Neurontin [Gabapentin] Rash     BP 140/70   Pulse (!) 59   Ht 5\' 8"  (1.727 m)   Wt (!) 313 lb (142 kg)   BMI 47.59 kg/m   Physical Exam Vitals signs reviewed.  Constitutional:      General: She is not in acute distress.    Appearance: She is well-developed. She is obese. She is not ill-appearing.  Musculoskeletal:     Right knee: She exhibits decreased range of motion and abnormal alignment. She exhibits no swelling, no effusion, no ecchymosis, no LCL laxity and no MCL laxity. Tenderness found. Lateral joint line tenderness noted.  Neurological:     Mental Status: She is alert and oriented to person, place, and time.     Sensory: Sensation is intact.     Motor: No weakness, tremor or abnormal muscle tone.  Psychiatric:        Judgment: Judgment normal.      Medical decisions:   Data  Imaging:   Images at [facility] 3 views of the right knee compared with x-ray from June 2019 No increase in arthritis in the lateral compartment tibiofemoral angle is increased valgus moderate arthritis   Encounter Diagnoses  Name Primary?  . Primary osteoarthritis of right knee Yes  . Primary localized osteoarthritis of knee   . Body mass index 45.0-49.9, adult ([place])   . Morbid obesity ([place])      PLAN:   Continue meloxicam Continue exercise Continue attempts at weight loss  The patient meets the AMA guidelines for Morbid (severe) obesity with a BMI > 40.0 and I have recommended weight loss.  Follow-up x-ray 6 months    Arther Abbott, MD 07/07/2018 9:32 AM

## 2018-08-07 DIAGNOSIS — J0100 Acute maxillary sinusitis, unspecified: Secondary | ICD-10-CM | POA: Diagnosis not present

## 2018-08-07 DIAGNOSIS — Z6841 Body Mass Index (BMI) 40.0 and over, adult: Secondary | ICD-10-CM | POA: Diagnosis not present

## 2018-08-12 ENCOUNTER — Telehealth: Payer: Self-pay | Admitting: *Deleted

## 2018-08-12 NOTE — Telephone Encounter (Signed)
I had spoken to patient on 12/16 and advised her Xolair for hives approved and can schedule start.  She advised she would call back next day. I never heard back from patient so I call her again on 12/24 and she advised she wanted to think about it and call back.  At this time I will assume she is not interested in starting therapy at this time and if she decides in the future she wants to start she can reach out to me.  I will let MD know in case he gets calls or visits and knows that status of initial request

## 2018-08-12 NOTE — Telephone Encounter (Signed)
Maybe the antihistamines helper her. Who knows!   Salvatore Marvel, MD [facility]

## 2018-08-24 DIAGNOSIS — J309 Allergic rhinitis, unspecified: Secondary | ICD-10-CM | POA: Diagnosis not present

## 2018-08-24 DIAGNOSIS — Z1389 Encounter for screening for other disorder: Secondary | ICD-10-CM | POA: Diagnosis not present

## 2018-08-24 DIAGNOSIS — Z6841 Body Mass Index (BMI) 40.0 and over, adult: Secondary | ICD-10-CM | POA: Diagnosis not present

## 2018-08-24 DIAGNOSIS — J019 Acute sinusitis, unspecified: Secondary | ICD-10-CM | POA: Diagnosis not present

## 2018-09-22 ENCOUNTER — Ambulatory Visit: Payer: BLUE CROSS/BLUE SHIELD | Admitting: Allergy & Immunology

## 2019-01-10 ENCOUNTER — Ambulatory Visit (INDEPENDENT_AMBULATORY_CARE_PROVIDER_SITE_OTHER): Payer: BC Managed Care – PPO

## 2019-01-10 ENCOUNTER — Encounter: Payer: Self-pay | Admitting: Orthopedic Surgery

## 2019-01-10 ENCOUNTER — Other Ambulatory Visit: Payer: Self-pay

## 2019-01-10 ENCOUNTER — Ambulatory Visit: Payer: BC Managed Care – PPO | Admitting: Orthopedic Surgery

## 2019-01-10 VITALS — BP 137/71 | HR 58 | Temp 98.0°F | Ht 68.0 in | Wt 313.0 lb

## 2019-01-10 DIAGNOSIS — M1711 Unilateral primary osteoarthritis, right knee: Secondary | ICD-10-CM

## 2019-01-10 DIAGNOSIS — Z6841 Body Mass Index (BMI) 40.0 and over, adult: Secondary | ICD-10-CM

## 2019-01-10 DIAGNOSIS — M1710 Unilateral primary osteoarthritis, unspecified knee: Secondary | ICD-10-CM | POA: Diagnosis not present

## 2019-01-10 NOTE — Progress Notes (Signed)
Progress Note   Patient ID: Meagan Reyes, female   DOB: 1956/07/05, 63 y.o.   MRN: [id number]   Chief Complaint  Patient presents with  . Knee Pain    right     Encounter Diagnoses  Name Primary?  . Primary osteoarthritis of right knee Yes  . Body mass index 45.0-49.9, adult ([place])   . Morbid obesity ([place])   . Primary localized osteoarthritis of knee     Allergies  Allergen Reactions  . Neurontin [Gabapentin] Rash     Current Outpatient Medications:  .  aspirin 81 MG chewable tablet, Chew 81 mg by mouth daily. Takes in the evening, Disp: , Rfl:  .  CALCIUM PO, Take 1 tablet by mouth daily., Disp: , Rfl:  .  cetirizine (ZYRTEC) 10 MG tablet, Take 1 tablet (10 mg total) by mouth daily., Disp: 60 tablet, Rfl: 5 .  famotidine (PEPCID) 20 MG tablet, Take 1 tablet (20 mg total) by mouth 2 (two) times daily., Disp: 180 tablet, Rfl: 1 .  furosemide (LASIX) 40 MG tablet, , Disp: , Rfl: 0 .  hydrOXYzine (ATARAX/VISTARIL) 25 MG tablet, Take 25 mg by mouth as needed for itching. , Disp: , Rfl: 0 .  lisinopril-hydrochlorothiazide (PRINZIDE,ZESTORETIC) 20-12.5 MG per tablet, Take 2 tablets by mouth daily., Disp: , Rfl:  .  meloxicam (MOBIC) 15 MG tablet, Take 1 tablet (15 mg total) by mouth daily., Disp: 90 tablet, Rfl: 2 .  montelukast (SINGULAIR) 10 MG tablet, Take 1 tablet (10 mg total) by mouth at bedtime., Disp: 30 tablet, Rfl: 5 .  Multiple Vitamins-Minerals (WOMENS DAILY FORMULA PO), Take 1 tablet by mouth daily., Disp: , Rfl:  .  Omega-3 Fatty Acids (FISH OIL) 1000 MG CAPS, Take 2,000 capsules by mouth 2 (two) times daily., Disp: , Rfl:  .  SUPER B COMPLEX/C PO, Take by mouth., Disp: , Rfl:  .  Turmeric 500 MG CAPS, Take by mouth., Disp: , Rfl:  .  UNABLE TO FIND, Take 1,000 mg by mouth 2 (two) times daily. Med Name: Moringa Oleifera, Disp: , Rfl:   Current Facility-Administered Medications:  .  nortriptyline (PAMELOR) capsule 10 mg, 10 mg, Oral, QHS, [name],  MD   63 year old female chronic pain left knee from osteoarthritis.  Presents for routine x-ray and follow-up  She complains of moderate left knee pain says her pain is stable at this time she is doing reasonably well with walking slight limp at times pain is under control    Review of Systems  Constitutional: Negative for weight loss.   Attempting to control weight but having a lot of difficulty   BP 137/71   Pulse (!) 58   Temp 98 F (36.7 C)   Ht 5\' 8"  (1.727 m)   Wt (!) 313 lb (142 kg)   BMI 47.59 kg/m   Physical Exam Vitals signs and nursing note reviewed.  Constitutional:      Appearance: Normal appearance.  Neurological:     Mental Status: She is alert and oriented to person, place, and time.  Psychiatric:        Mood and Affect: Mood normal.    Valgus alignment to right knee with tenderness along the lateral joint line No significant flexion contracture, flexes knee up to 120 degrees Seems to be stable in the coronal plane Neurovascular exam is intact Extension power is normal Skin shows no lesions  Medical decisions:  (Established problem worse, x-ray ,physical therapy, over-the-counter medicines, read outside film or  summarize x-ray)  Data  Imaging:   X-ray today shows valgus osteoarthritis right knee with mild secondary bone changes but severe joint space narrowing at the edge of the joint margin  Encounter Diagnoses  Name Primary?  . Primary osteoarthritis of right knee Yes  . Body mass index 45.0-49.9, adult ([place])   . Morbid obesity ([place])   . Primary localized osteoarthritis of knee   Body mass index is 47.59 kg/m. The patient meets the AMA guidelines for Morbid (severe) obesity with a BMI > 40.0 and I have recommended weight loss.   PLAN:   The patient will continue over-the-counter medications at this time follow-up in 6 months   Arther Abbott, MD 01/10/2019 8:45 AM

## 2019-02-14 ENCOUNTER — Other Ambulatory Visit (HOSPITAL_COMMUNITY): Payer: Self-pay | Admitting: Physician Assistant

## 2019-02-14 DIAGNOSIS — Z1231 Encounter for screening mammogram for malignant neoplasm of breast: Secondary | ICD-10-CM

## 2019-03-08 ENCOUNTER — Other Ambulatory Visit (HOSPITAL_COMMUNITY): Payer: Self-pay | Admitting: Physician Assistant

## 2019-03-08 DIAGNOSIS — N63 Unspecified lump in unspecified breast: Secondary | ICD-10-CM | POA: Diagnosis not present

## 2019-03-08 DIAGNOSIS — L03129 Acute lymphangitis of unspecified part of limb: Secondary | ICD-10-CM | POA: Diagnosis not present

## 2019-03-08 DIAGNOSIS — Z6841 Body Mass Index (BMI) 40.0 and over, adult: Secondary | ICD-10-CM | POA: Diagnosis not present

## 2019-03-08 DIAGNOSIS — N6320 Unspecified lump in the left breast, unspecified quadrant: Secondary | ICD-10-CM | POA: Diagnosis not present

## 2019-03-18 DIAGNOSIS — Z01411 Encounter for gynecological examination (general) (routine) with abnormal findings: Secondary | ICD-10-CM | POA: Diagnosis not present

## 2019-03-21 ENCOUNTER — Ambulatory Visit (HOSPITAL_COMMUNITY): Payer: BC Managed Care – PPO

## 2019-03-22 ENCOUNTER — Ambulatory Visit (HOSPITAL_COMMUNITY): Payer: BC Managed Care – PPO

## 2019-03-22 ENCOUNTER — Other Ambulatory Visit (HOSPITAL_COMMUNITY): Payer: Self-pay | Admitting: Physician Assistant

## 2019-03-22 ENCOUNTER — Ambulatory Visit (HOSPITAL_COMMUNITY)
Admission: RE | Admit: 2019-03-22 | Discharge: 2019-03-22 | Disposition: A | Discharge status: Home / Self Care | Payer: BC Managed Care – PPO | Source: Ambulatory Visit | Attending: Physician Assistant | Admitting: Physician Assistant

## 2019-03-22 ENCOUNTER — Other Ambulatory Visit: Payer: Self-pay

## 2019-03-22 DIAGNOSIS — N63 Unspecified lump in unspecified breast: Secondary | ICD-10-CM | POA: Insufficient documentation

## 2019-03-22 DIAGNOSIS — N6321 Unspecified lump in the left breast, upper outer quadrant: Secondary | ICD-10-CM | POA: Diagnosis not present

## 2019-03-22 DIAGNOSIS — N6489 Other specified disorders of breast: Secondary | ICD-10-CM | POA: Diagnosis not present

## 2019-03-25 ENCOUNTER — Emergency Department (HOSPITAL_COMMUNITY)
Admission: EM | Admit: 2019-03-25 | Discharge: 2019-03-25 | Disposition: A | Discharge status: Home / Self Care | Payer: BC Managed Care – PPO | Attending: Emergency Medicine | Admitting: Emergency Medicine

## 2019-03-25 ENCOUNTER — Other Ambulatory Visit: Payer: Self-pay

## 2019-03-25 ENCOUNTER — Emergency Department (HOSPITAL_COMMUNITY): Payer: BC Managed Care – PPO

## 2019-03-25 ENCOUNTER — Encounter (HOSPITAL_COMMUNITY): Payer: Self-pay

## 2019-03-25 DIAGNOSIS — Z79899 Other long term (current) drug therapy: Secondary | ICD-10-CM | POA: Diagnosis not present

## 2019-03-25 DIAGNOSIS — N61 Mastitis without abscess: Secondary | ICD-10-CM | POA: Diagnosis not present

## 2019-03-25 DIAGNOSIS — Z791 Long term (current) use of non-steroidal anti-inflammatories (NSAID): Secondary | ICD-10-CM | POA: Diagnosis not present

## 2019-03-25 DIAGNOSIS — R0789 Other chest pain: Secondary | ICD-10-CM | POA: Insufficient documentation

## 2019-03-25 DIAGNOSIS — L042 Acute lymphadenitis of upper limb: Secondary | ICD-10-CM | POA: Diagnosis not present

## 2019-03-25 DIAGNOSIS — I10 Essential (primary) hypertension: Secondary | ICD-10-CM | POA: Insufficient documentation

## 2019-03-25 DIAGNOSIS — Z7982 Long term (current) use of aspirin: Secondary | ICD-10-CM | POA: Diagnosis not present

## 2019-03-25 DIAGNOSIS — R079 Chest pain, unspecified: Secondary | ICD-10-CM | POA: Diagnosis not present

## 2019-03-25 DIAGNOSIS — Z6841 Body Mass Index (BMI) 40.0 and over, adult: Secondary | ICD-10-CM | POA: Diagnosis not present

## 2019-03-25 DIAGNOSIS — E119 Type 2 diabetes mellitus without complications: Secondary | ICD-10-CM | POA: Diagnosis not present

## 2019-03-25 LAB — CBC
HCT: 39.9 % (ref 36.0–46.0)
Hemoglobin: 13.1 g/dL (ref 12.0–15.0)
MCH: 28.7 pg (ref 26.0–34.0)
MCHC: 32.8 g/dL (ref 30.0–36.0)
MCV: 87.3 fL (ref 80.0–100.0)
Platelets: 240 10*3/uL (ref 150–400)
RBC: 4.57 MIL/uL (ref 3.87–5.11)
RDW: 14.5 % (ref 11.5–15.5)
WBC: 9.5 10*3/uL (ref 4.0–10.5)
nRBC: 0 % (ref 0.0–0.2)

## 2019-03-25 LAB — BASIC METABOLIC PANEL
Anion gap: 12 (ref 5–15)
BUN: 21 mg/dL (ref 8–23)
CO2: 25 mmol/L (ref 22–32)
Calcium: 9.1 mg/dL (ref 8.9–10.3)
Chloride: 102 mmol/L (ref 98–111)
Creatinine, Ser: 1.02 mg/dL — ABNORMAL HIGH (ref 0.44–1.00)
GFR calc Af Amer: >60 mL/min (ref >60)
GFR calc non Af Amer: 59 mL/min — ABNORMAL LOW (ref >60)
Glucose, Bld: 140 mg/dL — ABNORMAL HIGH (ref 70–99)
Potassium: 3.1 mmol/L — ABNORMAL LOW (ref 3.5–5.1)
Sodium: 139 mmol/L (ref 135–145)

## 2019-03-25 LAB — TROPONIN I (HIGH SENSITIVITY)
Troponin I (High Sensitivity): 4 ng/L (ref <18)
Troponin I (High Sensitivity): 4 ng/L (ref <18)

## 2019-03-25 MED ORDER — IOHEXOL 350 MG/ML SOLN
100.0000 mL | Freq: Once | INTRAVENOUS | Status: AC | PRN
  Administered 2019-03-25: 20:00:00 100 mL via INTRAVENOUS

## 2019-03-25 MED ORDER — SODIUM CHLORIDE 0.9% FLUSH: 3.0000 mL | Freq: Once | INTRAVENOUS | Status: DC

## 2019-03-25 NOTE — ED Triage Notes (Signed)
Pt presents to ED with complaints of mid chest pain which started last night that comes and goes. Pt states feels like a "discomfort." Pt denies SOB, dizziness. Pt with nausea and diaphoresis.

## 2019-03-25 NOTE — ED Provider Notes (Signed)
[facility] EMERGENCY DEPARTMENT Provider Note   CSN: [id number] Arrival date & time: 03/25/19  1436     History   Chief Complaint Chief Complaint  Patient presents with   Chest Pain    HPI Meagan Reyes is a 63 y.o. female.     Pt reports she had an episode of feeling hot and sweaty.  Pt reports she had some discomfort in the middle of her chest.  Pt reports she has had pain in her back in the past that radiated through to her chest.  Pt reports she has a history of back problems.  Pt reports Dr. Gerarda Fraction changed her antibiotic today and she took doxycycline an hour before the symptoms.  Pt states she did take it with a sandwich.  Pt reports she currently does not have any pain.  Pt reports she was nauseated.  Pt did not vomit.  She denies any current pain.  Pt reports she was working today but no exertion.  No fever, no cough.  Pt denies any chest congestion.  No abdominal pain.  Pt has a history of elevated cholesterol and hypertension   The history is provided by the patient. No language interpreter was used.  Chest Pain   Past Medical History:  Diagnosis Date   Hyperlipemia    Hypertension     Patient Active Problem List   Diagnosis Date Noted   Lateral meniscus tear    Chondromalacia, patella    Arthritis of right knee     Past Surgical History:  Procedure Laterality Date   ABDOMINAL HYSTERECTOMY     BACK SURGERY     HEEL SPUR EXCISION     KNEE ARTHROSCOPY WITH LATERAL MENISECTOMY Right 10/18/2015   Procedure: RIGHT KNEE ARTHROSCOPY WITH LATERAL MENISECTOMY;  Surgeon: [name], MD;  Location: [facility];  Service: Orthopedics;  Laterality: Right;   TONSILLECTOMY       OB History   No obstetric history on file.      Home Medications    Prior to Admission medications   Medication Sig Start Date End Date Taking? Authorizing Provider  aspirin 81 MG chewable tablet Chew 81 mg by mouth at bedtime.    Yes [provider]  CALCIUM PO Take  1 tablet by mouth daily.   Yes [provider]  cetirizine (ZYRTEC) 10 MG tablet Take 1 tablet (10 mg total) by mouth daily. 06/16/18  Yes Valentina Shaggy, MD  Cholecalciferol (VITAMIN D3) 25 MCG (1000 UT) CAPS Take 2,000 Units by mouth 2 (two) times daily.   Yes [provider]  cimetidine (TAGAMET) 400 MG tablet Take 400 mg by mouth 2 (two) times daily. 02/08/19  Yes [provider]  doxycycline (VIBRAMYCIN) 100 MG capsule Take 100 mg by mouth 2 (two) times daily. [place] 03/25/2019 03/25/19  Yes [provider]  furosemide (LASIX) 40 MG tablet Take 40 mg by mouth daily.  04/15/18  Yes [provider]  lisinopril-hydrochlorothiazide (PRINZIDE,ZESTORETIC) 20-12.5 MG per tablet Take 2 tablets by mouth daily.   Yes [provider]  lisinopril-hydrochlorothiazide (ZESTORETIC) 20-25 MG tablet Take 1 tablet by mouth daily. 01/28/19  Yes [provider]  MAGNESIUM PO Take 1 tablet by mouth daily.   Yes [provider]  meloxicam (MOBIC) 15 MG tablet Take 1 tablet (15 mg total) by mouth daily. 05/19/18  Yes [name], MD  Omega-3 Fatty Acids (FISH OIL) 1000 MG CAPS Take 2,000 capsules by mouth daily.  Yes [provider]  SUPER B COMPLEX/C PO Take by mouth.   Yes [provider]  Turmeric 500 MG CAPS Take by mouth.   Yes [provider]  UNABLE TO FIND Take 2 tablets by mouth every morning. Med Name: Moringa Oleifera    Yes [provider]  vitamin C (ASCORBIC ACID) 500 MG tablet Take 500 mg by mouth daily.   Yes [provider]  Zinc 15 MG CAPS Take 15 mg by mouth daily.   Yes [provider]    Family History Family History  Problem Relation Age of Onset   Stroke Mother    Asthma Mother    Atrial fibrillation Mother    Leukemia Mother    COPD Mother    Cancer Father        lung    Social History Social History   Tobacco Use    Smoking status: Never Smoker   Smokeless tobacco: Never Used  Substance Use Topics   Alcohol use: No   Drug use: No     Allergies   Neurontin [gabapentin]   Review of Systems Review of Systems  Cardiovascular: Positive for chest pain.  All other systems reviewed and are negative.    Physical Exam Updated Vital Signs BP 109/74    Pulse 66    Temp 98.2 F (36.8 C) (Oral)    Resp 19    Ht 5\' 8"  (1.727 m)    Wt (!) 145.2 kg    SpO2 99%    BMI 48.66 kg/m   Physical Exam Vitals signs and nursing note reviewed.  Constitutional:      Appearance: She is well-developed.  HENT:     Head: Normocephalic.  Neck:     Musculoskeletal: Normal range of motion.  Cardiovascular:     Rate and Rhythm: Normal rate and regular rhythm.     Heart sounds: Normal heart sounds.  Pulmonary:     Effort: Pulmonary effort is normal.     Breath sounds: Normal breath sounds. No decreased breath sounds.  Chest:     Chest wall: No deformity or crepitus.  Abdominal:     General: Bowel sounds are normal. There is no distension.     Palpations: Abdomen is soft.  Musculoskeletal: Normal range of motion.  Skin:    General: Skin is warm.  Neurological:     General: No focal deficit present.     Mental Status: She is alert and oriented to person, place, and time.  Psychiatric:        Mood and Affect: Mood normal.      ED Treatments / Results  Labs (all labs ordered are listed, but only abnormal results are displayed) Labs Reviewed  BASIC METABOLIC PANEL - Abnormal; Notable for the following components:      Result Value   Potassium 3.1 (*)    Glucose, Bld 140 (*)    Creatinine, Ser 1.02 (*)    GFR calc non Af Amer 59 (*)    All other components within normal limits  CBC  TROPONIN I (HIGH SENSITIVITY)  TROPONIN I (HIGH SENSITIVITY)    EKG EKG Interpretation  Date/Time:  Friday March 25 2019 17:46:22 EDT Ventricular Rate:  65 PR Interval:    QRS Duration: 97 QT  Interval:  408 QTC Calculation: 425 R Axis:   2 Text Interpretation:  Sinus rhythm Low voltage, precordial leads Baseline wander in lead(s) V6 Confirmed by Davonna Belling [phone]) on 03/25/2019 5:50:05 PM  Radiology Dg Chest 2 View  Result Date: 03/25/2019 CLINICAL DATA:  Chest pain EXAM: CHEST - 2 VIEW COMPARISON:  None. FINDINGS: The heart size and mediastinal contours are within normal limits. Both lungs are clear. The visualized skeletal structures are unremarkable. IMPRESSION: No active cardiopulmonary disease. Electronically Signed   By: Davina Poke M.D.   On: 03/25/2019 15:07   Ct Angio Chest Aorta W And/or Wo Contrast  Result Date: 03/25/2019 CLINICAL DATA:  Pt presents to ED with complaints of mid chest pain which started last night that comes and goes. Pt states feels like a "discomfort." Pt denies SOB, dizziness. Pt with nausea and diaphoresis. Rule out thoracic aortic dissection. EXAM: CT ANGIOGRAPHY CHEST WITH CONTRAST TECHNIQUE: Multidetector CT imaging of the chest was performed using the standard protocol during bolus administration of intravenous contrast. Multiplanar CT image reconstructions and MIPs were obtained to evaluate the vascular anatomy. CONTRAST:  135mL OMNIPAQUE IOHEXOL 350 MG/ML SOLN COMPARISON:  Chest x-ray today FINDINGS: Cardiovascular: Heart size is normal. Trace pericardial effusion. No significant coronary artery calcifications. There is minimal atherosclerotic calcification of the thoracic aorta, not associated with aneurysm. No aortic dissection. Three-vessel arch is normal in appearance. Mediastinum/Nodes: The visualized portion of the thyroid gland has a normal appearance. Esophagus is normal in appearance. Small hiatal hernia. No mediastinal, hilar, or axillary adenopathy. Lungs/Pleura: Focal ground-glass density is identified in the LATERAL RIGHT lung base measuring. 11 x 6 millimeters. No focal consolidations or pleural effusions. No pulmonary edema. Upper  Abdomen: Small hiatal hernia. Otherwise UPPER abdomen is unremarkable. Gallbladder is present. Musculoskeletal: Mild degenerative changes in the thoracic spine. Review of the MIP images confirms the above findings. IMPRESSION: 1. No evidence for acute abnormality of the chest. 2. Trace pericardial effusion. 3. Small hiatal hernia. 4. Aortic Atherosclerosis (ICD10-I70.0). Electronically Signed   By: Nolon Nations M.D.   On: 03/25/2019 20:36    Procedures Procedures (including critical care time)  Medications Ordered in ED Medications  sodium chloride flush (NS) 0.9 % injection 3 mL (3 mLs Intravenous Not Given 03/25/19 1831)  iohexol (OMNIPAQUE) 350 MG/ML injection 100 mL (100 mLs Intravenous Contrast Given 03/25/19 1956)     Initial Impression / Assessment and Plan / ED Course  I have reviewed the triage vital signs and the nursing notes.  Pertinent labs & imaging results that were available during my care of the patient were reviewed by me and considered in my medical decision making (see chart for details).  Clinical Course as of Mar 25 2119  Fri Mar 25, 2019  1614 ED EKG [LS]    Clinical Course User Index [LS] Fransico Meadow, PA-C       MDM   EKg  No acute abnormality,  Troponin negative x 2   Pt has a heart score of 3.   Ct of chest due to history of back pain on and off and today's chest discomfort.  Pt has a small pleural effusion.   Pt is currently pain free.  Pt thinks episode may have been related to doxycycline.    I advised pt to schedule follow up with cardiology.   Pt advised to return if symptoms worsen or change Final Clinical Impressions(s) / ED Diagnoses   Final diagnoses:  Atypical chest pain    ED Discharge Orders    None    An After Visit Summary was printed and given to the patient.    Sidney Ace 03/25/19 2147    Davonna Belling,  MD 03/25/19 2241

## 2019-03-25 NOTE — Discharge Instructions (Addendum)
Schedule to see the cardiologist for evaluation.  Continue aspirin.  Return if symptoms worsen or change.

## 2019-04-04 ENCOUNTER — Encounter: Payer: Self-pay | Admitting: Internal Medicine

## 2019-04-04 ENCOUNTER — Other Ambulatory Visit: Payer: Self-pay

## 2019-04-04 ENCOUNTER — Ambulatory Visit: Payer: BC Managed Care – PPO | Admitting: Internal Medicine

## 2019-04-04 DIAGNOSIS — R0602 Shortness of breath: Secondary | ICD-10-CM | POA: Diagnosis not present

## 2019-04-04 MED ORDER — METOPROLOL TARTRATE 100 MG PO TABS: ORAL_TABLET | ORAL | 0 refills | Status: DC

## 2019-04-04 NOTE — Patient Instructions (Signed)
Medication Instructions:  No changes today If you need a refill on your cardiac medications before your next appointment, please call your pharmacy.   Lab work: none If you have labs (blood work) drawn today and your tests are completely normal, you will receive your results only by: Marland Kitchen MyChart Message (if you have MyChart) OR . A paper copy in the mail If you have any lab test that is abnormal or we need to change your treatment, we will call you to review the results.  Testing/Procedures: Your physician has requested that you have cardiac CT. Cardiac computed tomography (CT) is a painless test that uses an x-ray machine to take clear, detailed pictures of your heart. For further information please visit [email or web address]. Please follow instruction sheet as given.   Follow-Up: Follow up with your physician will depend on test results.  Any Other Special Instructions Will Be Listed Below (If Applicable). We will obtain labs from Dr. Gerarda Fraction and review lipids  Your cardiac CT will be scheduled at one of the below locations:   [facility] [address] [place], [place] [address] [phone]  [facility] [address] [place], [place] [address] [phone]  If scheduled at [facility], please arrive at the [facility] main entrance of [facility] 30-45 minutes prior to test start time. Proceed to the [facility] Radiology Department (first floor) to check-in and test prep.  If scheduled at [facility], please arrive 15 mins early for check-in and test prep.  Please follow these instructions carefully (unless otherwise directed):   On the Night Before the Test: . Be sure to Drink plenty of water. . Do not consume any caffeinated/decaffeinated beverages or chocolate 12 hours prior to your test. Do not take any antihistamines 12 hours prior to your test.  On the Day of  the Test: . Drink plenty of water. Do not drink any water within one hour of the test. . Do not eat any food 4 hours prior to the test. . You may take your regular medications prior to the test.  . Take metoprolol (Lopressor) two hours prior to test. . HOLD Furosemide/Hydrochlorothiazide morning of the test. . FEMALES- please wear underwire-free bra if available      After the Test: . Drink plenty of water. . After receiving IV contrast, you may experience a mild flushed feeling. This is normal. . On occasion, you may experience a mild rash up to 24 hours after the test. This is not dangerous. If this occurs, you can take Benadryl 25 mg and increase your fluid intake. . If you experience trouble breathing, this can be serious. If it is severe call 911 IMMEDIATELY. If it is mild, please call our office. . If you take any of these medications: Glipizide/Metformin, Avandament, Glucavance, please do not take 48 hours after completing test unless otherwise instructed.   Please contact the cardiac imaging nurse navigator should you have any questions/concerns Marchia Bond, RN Navigator Cardiac [facility] [place] [facility] [phone] Office  [phone] Cell

## 2019-04-04 NOTE — Progress Notes (Signed)
Cardiology Office Note   Date:  04/04/2019   ID:  Meagan Reyes, DOB 07-Dec-1955, MRN [id number]  PCP:  Redmond School, MD  Cardiologist:   Dorris Carnes, MD   Pt referred from ED for evaluation of CP     History of Present Illness: Meagan Reyes is a 63 y.o. female with a history of morbid obesity  Followed by Dr Gerarda Fraction   She was seen in clinic there  Put on ABX initially on cephalexin then swithed to  dOxycycline for LN under arm   THis was on 9/4   After taking the first dose she developed severe chest discomfort  Sharp, stabbing To L arm    Lasted a couple hours    Alos pain in back  Hx of back problems    CT of chest done   Small pericardial effusion    Lungs with focal ground glass on RIGHT lateral base.  Minimal atherosclerosis of aorta noted    EKG done   No acute changes    After eased off, pt went home   FInished ABX    Has not had pain since    REcomm f/u with cardiologist     SInce then she has had no further pain      Current Meds  Medication Sig  . aspirin 81 MG chewable tablet Chew 81 mg by mouth at bedtime.   Marland Kitchen CALCIUM PO Take 1 tablet by mouth daily.  . cetirizine (ZYRTEC) 10 MG tablet Take 1 tablet (10 mg total) by mouth daily.  . Cholecalciferol (VITAMIN D3) 25 MCG (1000 UT) CAPS Take 2,000 Units by mouth 2 (two) times daily.  . famotidine (PEPCID) 20 MG tablet Take 20 mg by mouth 2 (two) times daily.  . furosemide (LASIX) 40 MG tablet Take 40 mg by mouth daily.   . hydrOXYzine HCl (ATARAX PO) Take by mouth as needed.  Marland Kitchen lisinopril-hydrochlorothiazide (ZESTORETIC) 20-25 MG tablet Take 1 tablet by mouth daily.  Marland Kitchen MAGNESIUM PO Take 1 tablet by mouth daily.  . meloxicam (MOBIC) 15 MG tablet Take 1 tablet (15 mg total) by mouth daily.  . Omega-3 Fatty Acids (FISH OIL) 1000 MG CAPS Take 2,000 capsules by mouth daily.   . SUPER B COMPLEX/C PO Take by mouth.  . Turmeric 500 MG CAPS Take by mouth.  Marland Kitchen UNABLE TO FIND Take 2 tablets by mouth every morning. Med Name:  Meagan Reyes   . vitamin C (ASCORBIC ACID) 500 MG tablet Take 500 mg by mouth daily.  . Zinc 15 MG CAPS Take 15 mg by mouth daily.   Current Facility-Administered Medications for the 04/04/19 encounter (Office Visit) with Fay Records, MD  Medication  . nortriptyline (PAMELOR) capsule 10 mg     Allergies:   Neurontin [gabapentin]   Past Medical History:  Diagnosis Date  . Hyperlipemia   . Hypertension     Past Surgical History:  Procedure Laterality Date  . ABDOMINAL HYSTERECTOMY    . BACK SURGERY    . HEEL SPUR EXCISION    . KNEE ARTHROSCOPY WITH LATERAL MENISECTOMY Right 10/18/2015   Procedure: RIGHT KNEE ARTHROSCOPY WITH LATERAL MENISECTOMY;  Surgeon: [name], MD;  Location: [facility];  Service: Orthopedics;  Laterality: Right;  . TONSILLECTOMY       Social History:  The patient  reports that she has never smoked. She has never used smokeless tobacco. She reports that she does not drink alcohol or use drugs.   Family History:  The patient's family history includes Asthma in her mother; Atrial fibrillation in her mother; COPD in her mother; Cancer in her father; Leukemia in her mother; Stroke in her mother.    ROS:  Please see the history of present illness. All other systems are reviewed and  Negative to the above problem except as noted.    PHYSICAL EXAM: VS:  BP 128/82   Pulse 98   Ht 5\' 8"  (1.727 m)   Wt (!) 317 lb 9.6 oz (144.1 kg)   SpO2 97%   BMI 48.29 kg/m   GEN: Morbidly obese 63 yo in no acute distress  HEENT: normal  Neck: no JVD, carotid bruits Cardiac: RRR; no murmurs, rubs, or gallops,no edema  Respiratory:  clear to auscultation bilaterally, normal work of breathing GI: soft, nontender, nondistended, + BS  No hepatomegaly  MS: no deformity Moving all extremities   Skin: warm and dry, no rash Neuro:  Strength and sensation are intact Psych: euthymic mood, full affect   EKG:  EKG is not  ordered today.  On 03/25/2019  SR      Lipid  Panel No results found for: CHOL, TRIG, HDL, CHOLHDL, VLDL, LDLCALC, LDLDIRECT    Wt Readings from Last 3 Encounters:  04/04/19 (!) 317 lb 9.6 oz (144.1 kg)  03/25/19 (!) 320 lb (145.2 kg)  01/10/19 (!) 313 lb (142 kg)      ASSESSMENT AND PLAN:  1  CHest pain   Atypical for angina  ONe spell   Does have some mild atherosclerosis    I would recomm a CTcoronary angiogram to eval for flow limitiing lesion  2   HL  Will need lipids   Pt should be on statin    3  HTN  BP is controlled   Keep on current regimen F/U based on test results      Current medicines are reviewed at length with the patient today.  The patient does not have concerns regarding medicines.  Signed, Dorris Carnes, MD  04/04/2019 2:04 PM    [address], [place], [place]  [address] Phone: [phone]; Fax: [phone]

## 2019-04-13 DIAGNOSIS — K219 Gastro-esophageal reflux disease without esophagitis: Secondary | ICD-10-CM | POA: Diagnosis not present

## 2019-04-27 ENCOUNTER — Telehealth: Payer: Self-pay | Admitting: *Deleted

## 2019-04-27 NOTE — Telephone Encounter (Signed)
Received message from CT scheduler that patient wanted to know if she needed this study because she had had a chest CT on 9/4.  I talked to the patient. I wanted to make sure she understood the difference in the chest ct she had on 9/4 and the cardiac ct that is ordered by Dr. Harrington Challenger.  She has since been referred to pulmonary by her PCP. She is calling them today to schedule it.  Pt wants to go to pulmonary appointment before she has cardiac ct.  She is still SOB but not having chest pain.  I asked her to let us know the outcome of pulmonary appointment.

## 2019-04-28 ENCOUNTER — Ambulatory Visit: Payer: BC Managed Care – PPO | Admitting: Cardiology

## 2019-05-02 ENCOUNTER — Telehealth: Payer: Self-pay | Admitting: Pulmonary Disease

## 2019-05-02 NOTE — Telephone Encounter (Signed)
Spoke with patient.  Voiced understanding Nothing further needed

## 2019-05-02 NOTE — Telephone Encounter (Signed)
Spoke with the pt  She is c/o nasal congestion and runny nose off and on for the past 2 days  She states she always has these symptoms in the spring and fall  She is having SOB and this started approx 1 month ago and is the reason for her consult  She states no to fever, chills, body aches and all other covid symptoms  Please advise if okay to still keep her on tomorrow's schedule

## 2019-05-02 NOTE — Telephone Encounter (Signed)
OK to keep appointment

## 2019-05-03 ENCOUNTER — Other Ambulatory Visit: Payer: Self-pay | Admitting: Pulmonary Disease

## 2019-05-03 ENCOUNTER — Encounter: Payer: Self-pay | Admitting: Pulmonary Disease

## 2019-05-03 ENCOUNTER — Other Ambulatory Visit: Payer: Self-pay

## 2019-05-03 ENCOUNTER — Ambulatory Visit: Payer: BC Managed Care – PPO | Admitting: Pulmonary Disease

## 2019-05-03 VITALS — BP 124/70 | HR 88 | Temp 98.1°F | Ht 68.0 in | Wt 319.6 lb

## 2019-05-03 DIAGNOSIS — R0602 Shortness of breath: Secondary | ICD-10-CM | POA: Diagnosis not present

## 2019-05-03 LAB — BASIC METABOLIC PANEL
BUN: 26 mg/dL — ABNORMAL HIGH (ref 6–23)
CO2: 31 mEq/L (ref 19–32)
Calcium: 10 mg/dL (ref 8.4–10.5)
Chloride: 99 mEq/L (ref 96–112)
Creatinine, Ser: 0.89 mg/dL (ref 0.40–1.20)
GFR: 64.25 mL/min (ref >60.00)
Glucose, Bld: 108 mg/dL — ABNORMAL HIGH (ref 70–99)
Potassium: 4 mEq/L (ref 3.5–5.1)
Sodium: 137 mEq/L (ref 135–145)

## 2019-05-03 MED ORDER — ALBUTEROL SULFATE HFA 108 (90 BASE) MCG/ACT IN AERS: 2.0000 | INHALATION_SPRAY | Freq: Four times a day (QID) | RESPIRATORY_TRACT | 3 refills | Status: DC | PRN

## 2019-05-03 NOTE — Progress Notes (Signed)
Subjective:   PATIENT ID: Monia Sabal GENDER: female DOB: [birth year], MRN: [id number]   HPI  Chief Complaint  Patient presents with  . Consult    self referral shortness of breath since taking doxycycline 9/4-9/14/2020   Reason for Visit: New consult for shortness of breath  Ms. Japneet Thunder is a 63 year female with HTN who presents for shortness of breath.   Cardiology note from 04/04/19 reviewed. She had been referred for chest pain by the ED. She has had shortness of breath and previously treated with keflex which was changed to doxycycline at the beginning of the month.  She reports she initially presented to her PCP in August for cellulitis and tender left axillary lymph node associated with left chest pain with doxycycline. Her cellulitis cleared but continued to have a tender lymph node. She had a a diagnostic mammogram that demonstrated a benign lump. She was given a second round of doxycycline for the still tender lymph node. After taking one dose of the doxycycline, she developed two hours later of chest pain, diaphoresis and shortness of breath. She was evaluated at [facility] ED. She was discharged and referred to outpatient Cardiology for atypical angina. CTA chest Aorta with no acute abnormality with trace pericardial effusion and minimal atherosclerotic calcification and focal GGO opacity in posterior right lower lobe lung. Her lymph node is improved.  Since then she reports shortness of breath with exertion. 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 Bending down and leaning over to pick up things including laundry will cause her to catch her breath. Rests improve her symptoms. She reports weight has been an issue for her. She has previously been successful with keto diet and lost 20 lbs 1-2 years ago but has regained it since then due to stopping the diet and low activity. She was started on lasix earlier this year for lower  extremity edema and puffy hands. She will take take lasix 5 times a week with improvement. Has not tried inhalers.  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 Has been involved with this work for 12 years. Her current workshop is in an old [employer] built in the [place].  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  Past Medical History:  Diagnosis Date  . Hyperlipemia   . Hypertension      Family History  Problem Relation Age of Onset  . Stroke Mother   . Asthma Mother   . Atrial fibrillation Mother   . Leukemia Mother   . COPD Mother   . Cancer Father        lung     Social History   Occupational History  . Not on file  Tobacco Use  . Smoking status: Never Smoker  . Smokeless tobacco: Never Used  Substance and Sexual Activity  . Alcohol use: No  . Drug use: No  . Sexual activity: Yes    Birth control/protection: Surgical    Allergies  Allergen Reactions  . Atorvastatin Other (See Comments)    Myalgia; pt reports this occurred in past, was prescribed by different doctor.  . Neurontin [Gabapentin] Rash     Outpatient Medications Prior to Visit  Medication Sig Dispense Refill  . aspirin 81  MG chewable tablet Chew 81 mg by mouth at bedtime.     Marland Kitchen CALCIUM PO Take 1 tablet by mouth daily.    . cetirizine (ZYRTEC) 10 MG tablet Take 1 tablet (10 mg total) by mouth daily. 60 tablet 5  . Cholecalciferol (VITAMIN D3) 25 MCG (1000 UT) CAPS Take 2,000 Units by mouth 2 (two) times daily.    . famotidine (PEPCID) 20 MG tablet Take 20 mg by mouth 2 (two) times daily.    . furosemide (LASIX) 40 MG tablet Take 40 mg by mouth daily.   0  . hydrOXYzine HCl (ATARAX PO) Take by mouth as needed.    Marland Kitchen lisinopril-hydrochlorothiazide  (ZESTORETIC) 20-25 MG tablet Take 1 tablet by mouth daily.    Marland Kitchen MAGNESIUM PO Take 1 tablet by mouth daily.    . meloxicam (MOBIC) 15 MG tablet Take 1 tablet (15 mg total) by mouth daily. 90 tablet 2  . metoprolol tartrate (LOPRESSOR) 100 MG tablet Take one tablet 2 hours prior to CT scan 1 tablet 0  . Omega-3 Fatty Acids (FISH OIL) 1000 MG CAPS Take 2,000 capsules by mouth daily.     . SUPER B COMPLEX/C PO Take by mouth.    . Turmeric 500 MG CAPS Take by mouth.    Marland Kitchen UNABLE TO FIND Take 2 tablets by mouth every morning. Med Name: [name]     . vitamin C (ASCORBIC ACID) 500 MG tablet Take 500 mg by mouth daily.    . Zinc 15 MG CAPS Take 15 mg by mouth daily.     Facility-Administered Medications Prior to Visit  Medication Dose Route Frequency Provider Last Rate Last Dose  . nortriptyline (PAMELOR) capsule 10 mg  10 mg Oral QHS [name], MD        Review of Systems  Constitutional: Positive for malaise/fatigue. Negative for chills, diaphoresis, fever and weight loss.  HENT: Negative for congestion, ear pain and sore throat.   Respiratory: Positive for shortness of breath. Negative for cough, hemoptysis, sputum production and wheezing.   Cardiovascular: Negative for chest pain, palpitations and leg swelling.  Gastrointestinal: Positive for heartburn. Negative for abdominal pain and nausea.  Genitourinary: Negative for frequency.  Musculoskeletal: Negative for joint pain and myalgias.  Skin: Positive for itching and rash.       Patient keeps hives has seen dermatology  Neurological: Negative for dizziness, weakness and headaches.  Endo/Heme/Allergies: Does not bruise/bleed easily.  Psychiatric/Behavioral: Negative for depression. The patient is not nervous/anxious.      Objective:   Vitals:   05/03/19 0915 05/03/19 0916  BP:  124/70  Pulse:  88  Temp: 98.1 F (36.7 C)   TempSrc: Temporal   SpO2:  99%  Weight: (!) 319 lb 9.6 oz (145 kg)   Height: 5\' 8"  (1.727  m)       Physical Exam: General: BMI 48. Well-appearing, no acute distress HENT: [place], AT Eyes: EOMI, no scleral icterus Respiratory: Diminished breath sounds bilaterally.  No crackles, wheezing or rales Cardiovascular: RRR, -M/R/G, no JVD GI: BS+, soft, nontender Extremities:Non-pitting lower extremity edema,-tenderness Neuro: AAO x4, CNII-XII grossly intact Skin: Intact, no rashes or bruising Psych: Normal mood, normal affect  Data Reviewed:  Imaging: CTA chest Aorta 03/25/19 -  no acute abnormality with trace pericardial effusion and minimal atherosclerotic calcification and focal GGO opacity in posterior right lower lobe lung. No reticulation noted  PFT: None on file  Labs: CBC    Component Value Date/Time  WBC 9.5 03/25/2019 1515   RBC 4.57 03/25/2019 1515   HGB 13.1 03/25/2019 1515   HGB 12.7 06/16/2018 1018   HCT 39.9 03/25/2019 1515   HCT 37.7 06/16/2018 1018   PLT 240 03/25/2019 1515   PLT 255 06/16/2018 1018   MCV 87.3 03/25/2019 1515   MCV 85 06/16/2018 1018   MCH 28.7 03/25/2019 1515   MCHC 32.8 03/25/2019 1515   RDW 14.5 03/25/2019 1515   RDW 13.4 06/16/2018 1018   LYMPHSABS 2.9 06/16/2018 1018   MONOABS 0.6 10/16/2015 1500   EOSABS 0.2 06/16/2018 1018   BASOSABS 0.0 06/16/2018 1018   BMET    Component Value Date/Time   NA 139 03/25/2019 1515   NA 136 06/16/2018 1018   K 3.1 (L) 03/25/2019 1515   CL 102 03/25/2019 1515   CO2 25 03/25/2019 1515   GLUCOSE 140 (H) 03/25/2019 1515   BUN 21 03/25/2019 1515   BUN 22 06/16/2018 1018   CREATININE 1.02 (H) 03/25/2019 1515   CALCIUM 9.1 03/25/2019 1515   GFRNONAA 59 (L) 03/25/2019 1515   GFRAA >60 03/25/2019 1515    Imaging, labs and tests noted above have been reviewed independently by me.    Assessment & Plan:   Discussion: 63 year old female never smoker who presents with gradually worsening dyspnea which progressively worsened in the last two months with limitations in walking uphill. 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 We will order PFTs and echocardiogram to rule out organic causes of her breathing. We discussed risks of her profession and how it may potentially contribute to her symptoms. We also discussed how weight can restrict breathing. Will follow-up after PFTs and echo.  Shortness of breath --Will arrange for pulmonary function test to evaluate your lungs --Will schedule an echocardiogram to evaluate your heart --START Albuterol as needed for shortness of breath or wheezing --I agree with continuing diet and exercise for goal of weight loss. Stay motivated!  Health Maintenance  There is no immunization history on file for this patient.   Orders Placed This Encounter  Procedures  . Basic Metabolic Panel (BMET)    Standing Status:   Future    Number of Occurrences:   1    Standing Expiration Date:   05/02/2020  . ECHOCARDIOGRAM COMPLETE    Standing Status:   Future    Standing Expiration Date:   08/02/2020    Scheduling Instructions:     Please schedule next available preferably in a month or less    Order Specific Question:   Where should this test be performed    Answer:   [facility])    Order Specific Question:   Does the patient weigh less than or greater than 250 lbs?    Answer:   Patient weighs greater than 250 lbs    Order Specific Question:   Perflutren DEFINITY (image enhancing agent) should be administered unless hypersensitivity or allergy exist    Answer:   Administer Perflutren    Order Specific Question:   Reason for exam-Echo    Answer:   Dyspnea  786.09 / R06.00  . Pulmonary function test    Standing Status:   Future    Standing Expiration Date:   05/02/2020    Order Specific Question:   Where should this test be performed?    Answer:   [facility]    Order Specific Question:  Full PFT: includes the following: basic spirometry, spirometry pre & post  bronchodilator, diffusion capacity (DLCO), lung volumes    Answer:   Full PFT   Meds ordered this encounter  Medications  . albuterol (VENTOLIN HFA) 108 (90 Base) MCG/ACT inhaler    Sig: Inhale 2 puffs into the lungs every 6 (six) hours as needed for wheezing or shortness of breath.    Dispense:  6.7 g    Refill:  3    Return after PFTs.  [place], MD [facility] 05/03/2019 7:46 AM  Office Number [phone]

## 2019-05-03 NOTE — Patient Instructions (Signed)
Shortness of breath --Will arrange for pulmonary function test to evaluate your lungs --Will schedule an echocardiogram to evaluate your heart --START Albuterol as needed for shortness of breath or wheezing --I agree with continuing diet and exercise for goal of weight loss. Stay motivated!  Follow-up after PFTs

## 2019-05-04 NOTE — Progress Notes (Signed)
Pt aware of result of lab and lasix recommendations. Nothing further needed.

## 2019-05-06 DIAGNOSIS — R0600 Dyspnea, unspecified: Secondary | ICD-10-CM | POA: Insufficient documentation

## 2019-05-06 DIAGNOSIS — R0602 Shortness of breath: Secondary | ICD-10-CM | POA: Insufficient documentation

## 2019-05-06 DIAGNOSIS — R0609 Other forms of dyspnea: Secondary | ICD-10-CM | POA: Insufficient documentation

## 2019-05-09 ENCOUNTER — Other Ambulatory Visit (HOSPITAL_COMMUNITY)
Admission: RE | Admit: 2019-05-09 | Discharge: 2019-05-09 | Disposition: A | Discharge status: Home / Self Care | Payer: BC Managed Care – PPO | Source: Ambulatory Visit | Attending: Pulmonary Disease | Admitting: Pulmonary Disease

## 2019-05-09 DIAGNOSIS — Z01812 Encounter for preprocedural laboratory examination: Secondary | ICD-10-CM | POA: Diagnosis not present

## 2019-05-09 DIAGNOSIS — Z20828 Contact with and (suspected) exposure to other viral communicable diseases: Secondary | ICD-10-CM | POA: Diagnosis not present

## 2019-05-09 LAB — SARS CORONAVIRUS 2 (TAT 6-24 HRS): SARS Coronavirus 2: NEGATIVE

## 2019-05-11 ENCOUNTER — Ambulatory Visit (HOSPITAL_COMMUNITY)
Admission: RE | Admit: 2019-05-11 | Discharge: 2019-05-11 | Disposition: A | Discharge status: Home / Self Care | Payer: BC Managed Care – PPO | Source: Ambulatory Visit | Attending: Pulmonary Disease | Admitting: Pulmonary Disease

## 2019-05-11 ENCOUNTER — Other Ambulatory Visit: Payer: Self-pay

## 2019-05-11 DIAGNOSIS — R0602 Shortness of breath: Secondary | ICD-10-CM | POA: Diagnosis not present

## 2019-05-11 LAB — PULMONARY FUNCTION TEST
DL/VA % pred: 127 %
DL/VA: 5.21 ml/min/mmHg/L
DLCO unc % pred: 105 %
DLCO unc: 24.2 ml/min/mmHg
FEF 25-75 Post: 3.9 L/sec
FEF 25-75 Pre: 3.7 L/sec
FEF2575-%Change-Post: 5 %
FEF2575-%Pred-Post: 155 %
FEF2575-%Pred-Pre: 147 %
FEV1-%Change-Post: 1 %
FEV1-%Pred-Post: 98 %
FEV1-%Pred-Pre: 96 %
FEV1-Post: 2.84 L
FEV1-Pre: 2.8 L
FEV1FVC-%Change-Post: 1 %
FEV1FVC-%Pred-Pre: 112 %
FEV6-%Change-Post: 0 %
FEV6-%Pred-Post: 88 %
FEV6-%Pred-Pre: 88 %
FEV6-Post: 3.2 L
FEV6-Pre: 3.21 L
FEV6FVC-%Pred-Post: 103 %
FEV6FVC-%Pred-Pre: 103 %
FVC-%Change-Post: 0 %
FVC-%Pred-Post: 85 %
FVC-%Pred-Pre: 85 %
FVC-Post: 3.2 L
FVC-Pre: 3.21 L
Post FEV1/FVC ratio: 89 %
Post FEV6/FVC ratio: 100 %
Pre FEV1/FVC ratio: 87 %
Pre FEV6/FVC Ratio: 100 %
RV % pred: 64 %
RV: 1.44 L
TLC % pred: 86 %
TLC: 4.89 L

## 2019-05-11 MED ORDER — ALBUTEROL SULFATE (2.5 MG/3ML) 0.083% IN NEBU
2.5000 mg | INHALATION_SOLUTION | Freq: Once | RESPIRATORY_TRACT | Status: AC
  Administered 2019-05-11: 15:00:00 2.5 mg via RESPIRATORY_TRACT

## 2019-05-12 ENCOUNTER — Other Ambulatory Visit: Payer: Self-pay | Admitting: Orthopedic Surgery

## 2019-05-12 ENCOUNTER — Ambulatory Visit (HOSPITAL_BASED_OUTPATIENT_CLINIC_OR_DEPARTMENT_OTHER)
Admission: RE | Admit: 2019-05-12 | Discharge: 2019-05-12 | Disposition: A | Discharge status: Home / Self Care | Payer: BC Managed Care – PPO | Source: Ambulatory Visit | Attending: Pulmonary Disease | Admitting: Pulmonary Disease

## 2019-05-12 ENCOUNTER — Other Ambulatory Visit: Payer: Self-pay

## 2019-05-12 DIAGNOSIS — R0602 Shortness of breath: Secondary | ICD-10-CM

## 2019-05-12 DIAGNOSIS — M1710 Unilateral primary osteoarthritis, unspecified knee: Secondary | ICD-10-CM

## 2019-05-12 NOTE — Progress Notes (Signed)
*  PRELIMINARY RESULTS* Echocardiogram 2D Echocardiogram has been performed.  Samuel Germany 05/12/2019, 2:31 PM

## 2019-05-17 ENCOUNTER — Telehealth: Payer: Self-pay | Admitting: Pulmonary Disease

## 2019-05-17 ENCOUNTER — Encounter: Payer: Self-pay | Admitting: Pulmonary Disease

## 2019-05-17 NOTE — Telephone Encounter (Signed)
Meagan Reyes - please let patient know I recently opened up clinic on 05/25/19 if patient wishes to see me then. I have reviewed her PFTs and echocardiogram. PFTs overall demonstrate no evidence of lung disease. Echocardiogram does shoe evidence of chronic diastolic heart failure. We can further discuss this at our next visit.

## 2019-05-17 NOTE — Telephone Encounter (Addendum)
Spoke with pt, Dr. Tamala Julian was present was present in clinic and looked over her results with me. I advised her that her PFT and echo looked ok per Dr. Tamala Julian. Patient stated that she needed to be seen sooner because she is unable to return to work if she is still SOB. I advised her that Dr. Loanne Drilling did not have any appointments sooner and she declined seeing an NP. Dr Loanne Drilling please advise.

## 2019-05-18 NOTE — Telephone Encounter (Signed)
I called pt and she scheduled an OV on 05/25/2019 at 9:15am. Nothing further is needed.

## 2019-05-25 ENCOUNTER — Telehealth: Payer: Self-pay | Admitting: *Deleted

## 2019-05-25 ENCOUNTER — Encounter: Payer: Self-pay | Admitting: Pulmonary Disease

## 2019-05-25 ENCOUNTER — Ambulatory Visit: Payer: BC Managed Care – PPO | Admitting: Pulmonary Disease

## 2019-05-25 ENCOUNTER — Other Ambulatory Visit: Payer: Self-pay

## 2019-05-25 VITALS — BP 130/64 | HR 66 | Temp 97.3°F | Ht 68.0 in | Wt 319.4 lb

## 2019-05-25 DIAGNOSIS — I5032 Chronic diastolic (congestive) heart failure: Secondary | ICD-10-CM | POA: Diagnosis not present

## 2019-05-25 DIAGNOSIS — R5381 Other malaise: Secondary | ICD-10-CM

## 2019-05-25 DIAGNOSIS — Z01812 Encounter for preprocedural laboratory examination: Secondary | ICD-10-CM

## 2019-05-25 DIAGNOSIS — R0602 Shortness of breath: Secondary | ICD-10-CM | POA: Diagnosis not present

## 2019-05-25 MED ORDER — METOPROLOL TARTRATE 100 MG PO TABS: ORAL_TABLET | ORAL | 0 refills | Status: DC

## 2019-05-25 NOTE — Telephone Encounter (Signed)
-----   Message from Armando Gang sent at 05/17/2019  2:47 PM EDT ----- Regarding: Need Labs Patient is schedule for CT on 05-30-19- she will need to redo labs.   She want the order to go to [facility] in [place].

## 2019-05-25 NOTE — Progress Notes (Signed)
Subjective:   PATIENT ID: Meagan Reyes GENDER: female DOB: [birth year], MRN: [id number]   HPI  Chief Complaint  Patient presents with  . Follow-up    shortness of breath only on albuterol   Reason for Visit: Follow-up for shortness of breath  Ms. Meagan Reyes is a 63 year female with HTN who presents for shortness of breath.   Cardiology note from 04/04/19 reviewed. She had been referred for chest pain by the ED. She has had shortness of breath and previously treated with keflex which was changed to doxycycline at the beginning of the month.  She reports she initially presented to her PCP in August for cellulitis and tender left axillary lymph node associated with left chest pain with doxycycline. Her cellulitis cleared but continued to have a tender lymph node. She had a a diagnostic mammogram that demonstrated a benign lump. She was given a second round of doxycycline for the still tender lymph node. After taking one dose of the doxycycline, she developed two hours later of chest pain, diaphoresis and shortness of breath. She was evaluated at [facility] ED. She was discharged and referred to outpatient Cardiology for atypical angina. CTA chest Aorta with no acute abnormality with trace pericardial effusion and minimal atherosclerotic calcification and focal GGO opacity in posterior right lower lobe lung. Her lymph node is improved.  Since then she reports shortness of breath with exertion. 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 Bending down and leaning over to pick up things including laundry will cause her to catch her breath. Rests improve her symptoms. She reports weight has been an issue for her. She has previously been successful with keto diet and lost 20 lbs 1-2 years ago but has regained it since then due to stopping the diet and low activity. She was started on lasix earlier this year for lower extremity edema and puffy hands.  She will take take lasix 5 times a week with improvement. Has not tried inhalers.  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 Has been involved with this work for 12 years. Her current workshop is in an old [place] built in the [place].  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  Past Medical History:  Diagnosis Date  . Hyperlipemia   . Hypertension      Family History  Problem Relation Age of Onset  . Stroke Mother   . Asthma Mother   . Atrial fibrillation Mother   . Leukemia Mother   . COPD Mother   . Cancer Father        lung  . Hyperlipidemia Brother   . Hyperlipidemia Brother   . Hyperlipidemia Brother      Social History   Occupational History  . Not on file  Tobacco Use  . Smoking status: Former Smoker    Packs/day: 0.10    Years: 0.25    Pack years: 0.02    Start date: 1976    Quit date: 1976    Years since quitting: 44.8  . Smokeless tobacco: Never Used  . Tobacco comment: a couple months when 19 then stopped  Substance and Sexual Activity  . Alcohol use: No  . Drug use: No  . Sexual activity:  Yes    Birth control/protection: Surgical    Allergies  Allergen Reactions  . Atorvastatin Other (See Comments)    Myalgia; pt reports this occurred in past, was prescribed by different doctor.  . Doxycycline     Shortness or breath one time another time had symptoms of heart attack  . Neurontin [Gabapentin] Rash     Outpatient Medications Prior to Visit  Medication Sig Dispense Refill  . albuterol (VENTOLIN HFA) 108 (90 Base) MCG/ACT inhaler Inhale 2 puffs into the lungs every 6 (six) hours as needed for wheezing or shortness of breath. 6.7 g 3  . aspirin 81 MG chewable tablet Chew 81 mg by mouth at  bedtime.     Marland Kitchen CALCIUM PO Take 1 tablet by mouth daily.    . cetirizine (ZYRTEC) 10 MG tablet Take 1 tablet (10 mg total) by mouth daily. 60 tablet 5  . Cholecalciferol (VITAMIN D3) 25 MCG (1000 UT) CAPS Take 2,000 Units by mouth 2 (two) times daily.    . famotidine (PEPCID) 20 MG tablet Take 20 mg by mouth 2 (two) times daily.    . furosemide (LASIX) 40 MG tablet Take 40 mg by mouth daily.   0  . hydrOXYzine HCl (ATARAX PO) Take by mouth as needed.    Marland Kitchen lisinopril-hydrochlorothiazide (ZESTORETIC) 20-25 MG tablet Take 1 tablet by mouth daily.    Marland Kitchen MAGNESIUM PO Take 1 tablet by mouth daily.    . meloxicam (MOBIC) 15 MG tablet Take 1 tablet by mouth once daily 90 tablet 0  . metoprolol tartrate (LOPRESSOR) 100 MG tablet Take one tablet 2 hours prior to CT scan 1 tablet 0  . Omega-3 Fatty Acids (FISH OIL) 1000 MG CAPS Take 2,000 capsules by mouth daily.     . SUPER B COMPLEX/C PO Take by mouth.    . Turmeric 500 MG CAPS Take by mouth.    Marland Kitchen UNABLE TO FIND Take 2 tablets by mouth every morning. Med Name: [name]     . vitamin C (ASCORBIC ACID) 500 MG tablet Take 500 mg by mouth daily.    . Zinc 15 MG CAPS Take 15 mg by mouth daily.     Facility-Administered Medications Prior to Visit  Medication Dose Route Frequency Provider Last Rate Last Dose  . nortriptyline (PAMELOR) capsule 10 mg  10 mg Oral QHS [name], MD        Review of Systems  Constitutional: Positive for malaise/fatigue. Negative for chills, diaphoresis, fever and weight loss.  HENT: Negative for congestion, ear pain and sore throat.   Respiratory: Positive for shortness of breath. Negative for cough, hemoptysis, sputum production and wheezing.   Cardiovascular: Negative for chest pain, palpitations and leg swelling.  Gastrointestinal: Positive for heartburn. Negative for abdominal pain and nausea.  Genitourinary: Negative for frequency.  Musculoskeletal: Negative for joint pain and myalgias.  Skin: Positive  for itching and rash.       Patient keeps hives has seen dermatology  Neurological: Negative for dizziness, weakness and headaches.  Endo/Heme/Allergies: Does not bruise/bleed easily.  Psychiatric/Behavioral: Negative for depression. The patient is not nervous/anxious.      Objective:   Vitals:   05/25/19 0916 05/25/19 0917  BP:  130/64  Pulse: 66 66  Temp: (!) 97.3 F (36.3 C)   TempSrc: Temporal   SpO2: 99% 99%  Weight: (!) 319 lb 6.4 oz (144.9 kg)   Height: 5\' 8"  (1.727 m)      Physical Exam: General:  BMI 48.56. Well-appearing, no acute distress HENT: [place], AT Eyes: EOMI, no scleral icterus Respiratory: Diminished breath sounds bilaterally. No crackles, wheezing or rales Cardiovascular: RRR, -M/R/G, no JVD GI: BS+, soft, nontender Extremities:Non-pitting edema in lower extremities bilaterally,-tenderness Neuro: AAO x4, CNII-XII grossly intact Skin: Intact, no rashes or bruising Psych: Normal mood, normal affect  Data Reviewed:  Imaging: CTA chest Aorta 03/25/19 -  no acute abnormality with trace pericardial effusion and minimal atherosclerotic calcification and focal GGO opacity in posterior right lower lobe lung. No reticulation noted  PFT: 05/11/19 FVC 3.20 (85%) FEV1 2.84 (98%) Ratio 87  TLC 86% DLCO 105% Interpretation: Normal PFTs. Reduced ERV suggestive of chest wall compliance  Labs: CBC    Component Value Date/Time   WBC 9.5 03/25/2019 1515   RBC 4.57 03/25/2019 1515   HGB 13.1 03/25/2019 1515   HGB 12.7 06/16/2018 1018   HCT 39.9 03/25/2019 1515   HCT 37.7 06/16/2018 1018   PLT 240 03/25/2019 1515   PLT 255 06/16/2018 1018   MCV 87.3 03/25/2019 1515   MCV 85 06/16/2018 1018   MCH 28.7 03/25/2019 1515   MCHC 32.8 03/25/2019 1515   RDW 14.5 03/25/2019 1515   RDW 13.4 06/16/2018 1018   LYMPHSABS 2.9 06/16/2018 1018   MONOABS 0.6 10/16/2015 1500   EOSABS 0.2 06/16/2018 1018   BASOSABS 0.0 06/16/2018 1018   BMET    Component Value Date/Time    NA 137 05/03/2019 1031   NA 136 06/16/2018 1018   K 4.0 05/03/2019 1031   CL 99 05/03/2019 1031   CO2 31 05/03/2019 1031   GLUCOSE 108 (H) 05/03/2019 1031   BUN 26 (H) 05/03/2019 1031   BUN 22 06/16/2018 1018   CREATININE 0.89 05/03/2019 1031   CALCIUM 10.0 05/03/2019 1031   GFRNONAA 59 (L) 03/25/2019 1515   GFRAA >60 03/25/2019 1515   Imaging, labs and test noted above have been reviewed independently by me.    Assessment & Plan:   Discussion: 63 year old female never smoker who initially presented for dyspnea on exertion that progressively worsened over two months. 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 We reviewed her PFTs and echocardiogram which are overall normal except for chronic diastolic heart failure. Her work is still likely to pose long-term risks for her breathing so I believe it would reasonable to evaluate her on at least a yearly basis. For now, her lungs are not the cause of her breathing and are more likely related to a combination of her chronic diastolic heart failure and deconditioning.  Shortness of breath From our assessment, it is not a lung issue. I believe it is related from a combination of diastolic heart failure and deconditioning.  --STOP Albuterol  --Continue lasix as directed. Monitor your weight. Please ensure you take your lasix if your weight increases 3lbs over 24 hours and 5lbs over one week  --I agree with continuing diet and exercise for goal of weight loss. Stay motivated!  Follow-up with me in one year for Pulmonary function tests.  Your job is high risk and I would like to continue to monitor your lung volumes to make sure they are stable.  Call us as needed!  Health Maintenance  There is no immunization history on file for this patient.   Orders Placed This Encounter  Procedures  . Pulmonary function test    Standing Status:   Future    Standing Expiration  Date:   05/24/2020     Order Specific Question:   Where should this test be performed?    Answer:   [facility]    Order Specific Question:   Full PFT: includes the following: basic spirometry, spirometry pre & post bronchodilator, diffusion capacity (DLCO), lung volumes    Answer:   Full PFT   No orders of the defined types were placed in this encounter.   Return if symptoms worsen or fail to improve.  Greater than 50% of this patient 25-minute office visit was spent face-to-face in counseling with the patient/family. We discussed medical diagnosis and treatment plan as noted.  [place], MD [facility] 05/25/2019 7:57 AM  Office Number [phone]

## 2019-05-25 NOTE — Telephone Encounter (Signed)
Called patient and adv that lab order for BMET so that she can go to [place]. She will pick up Metoprolol to have for day of test.  Saw pulmonary today and was told there was one number out of range on lung function tests and that would not be the reason for her SOB.  She recommended patient have cardiac CT to see if reason for SOB is from heart. Pulmonary is not going to see her again but will repeat PFts in one year.  Pt would like Dr. Harrington Challenger to review those notes.  Called pt's pharmacy and confirmed they still have prescription for metoprolol 100 mg to be take 2 hrs prior to ct scan.  No one answered.  Resent prescription.

## 2019-05-25 NOTE — Patient Instructions (Addendum)
Shortness of breath From our assessment, it is not a lung issue. I believe it is related from a combination of diastolic heart failure and deconditioning.  --STOP Albuterol  --Continue lasix as directed. Monitor your weight. Please ensure you take your lasix if your weight increases 3lbs over 24 hours and 5lbs over one week  --I agree with continuing diet and exercise for goal of weight loss. Stay motivated!  Follow-up with me in one year for Pulmonary function tests.  Your job is high risk and I would like to continue to monitor your lung volumes to make sure they are stable.  Call us as needed!

## 2019-05-27 ENCOUNTER — Telehealth (HOSPITAL_COMMUNITY): Payer: Self-pay | Admitting: Emergency Medicine

## 2019-05-27 NOTE — Telephone Encounter (Signed)
Reaching out to patient to offer assistance regarding upcoming cardiac imaging study; pt verbalizes understanding of appt date/time, parking situation and where to check in, pre-test NPO status and medications ordered, and verified current allergies; name and call back number provided for further questions should they arise Jarell Mcewen RN Navigator Cardiac Imaging [facility] [phone] office [phone] cell 

## 2019-05-29 DIAGNOSIS — I5032 Chronic diastolic (congestive) heart failure: Secondary | ICD-10-CM | POA: Insufficient documentation

## 2019-05-30 ENCOUNTER — Encounter: Payer: BC Managed Care – PPO | Admitting: *Deleted

## 2019-05-30 ENCOUNTER — Ambulatory Visit (HOSPITAL_COMMUNITY)
Admission: RE | Admit: 2019-05-30 | Discharge: 2019-05-30 | Disposition: A | Discharge status: Home / Self Care | Payer: BC Managed Care – PPO | Source: Ambulatory Visit | Attending: Internal Medicine | Admitting: Internal Medicine

## 2019-05-30 ENCOUNTER — Other Ambulatory Visit: Payer: Self-pay

## 2019-05-30 ENCOUNTER — Encounter (HOSPITAL_COMMUNITY): Payer: Self-pay

## 2019-05-30 DIAGNOSIS — I700 Atherosclerosis of aorta: Secondary | ICD-10-CM | POA: Diagnosis not present

## 2019-05-30 DIAGNOSIS — Z006 Encounter for examination for normal comparison and control in clinical research program: Secondary | ICD-10-CM

## 2019-05-30 DIAGNOSIS — R0602 Shortness of breath: Secondary | ICD-10-CM | POA: Diagnosis not present

## 2019-05-30 MED ORDER — NITROGLYCERIN 0.4 MG SL SUBL
SUBLINGUAL_TABLET | SUBLINGUAL | Status: AC
  Filled 2019-05-30: qty 2

## 2019-05-30 MED ORDER — NITROGLYCERIN 0.4 MG SL SUBL
0.8000 mg | SUBLINGUAL_TABLET | Freq: Once | SUBLINGUAL | Status: AC
  Administered 2019-05-30: 09:00:00 0.8 mg via SUBLINGUAL

## 2019-05-30 MED ORDER — IOHEXOL 350 MG/ML SOLN
80.0000 mL | Freq: Once | INTRAVENOUS | Status: AC | PRN
  Administered 2019-05-30: 09:00:00 100 mL via INTRAVENOUS

## 2019-05-30 NOTE — Research (Signed)
CADFEM Informed Consent                  Subject Name:   Meagan Reyes   Subject met inclusion and exclusion criteria.  The informed consent form, study requirements and expectations were reviewed with the subject and questions and concerns were addressed prior to the signing of the consent form.  The subject verbalized understanding of the trial requirements.  The subject agreed to participate in the CADFEM trial and signed the informed consent.  The informed consent was obtained prior to performance of any protocol-specific procedures for the subject.  A copy of the signed informed consent was given to the subject and a copy was placed in the subject's medical record.   [place] Bayron Dalto, Research Assistant  05/30/2019 07:30 a.m.

## 2019-05-31 ENCOUNTER — Telehealth: Payer: Self-pay | Admitting: Internal Medicine

## 2019-05-31 NOTE — Telephone Encounter (Signed)
New message  Patient is calling in for her cardiac ct results. Please give patient a call back to discuss.

## 2019-06-02 NOTE — Telephone Encounter (Signed)
Provided results of cardiac CT to patient. She has been told by pulmonary to f/u with cardiology for SOB and does not understand why it is continuing.  Moved f/u with Dr.Ross up to tomorrow so that this can be discussed.  Will come alone and will wear a mask.

## 2019-06-02 NOTE — Telephone Encounter (Signed)
Patient returning call in regards to results from CT

## 2019-06-02 NOTE — Progress Notes (Signed)
Cardiology Office Note   Date:  06/05/2019   ID:  Meagan Reyes, DOB 11-14-1955, MRN [id number]  PCP:  Redmond School, MD  Cardiologist:   Dorris Carnes, MD   Pt presents for F?U of dyspnea     History of Present Illness: Meagan Reyes is a 63 y.o. female with a history of morbid obesity  Followed by Dr Gerarda Fraction   I saw her in September  She Put Says back in Aug she was put on cephalexin then swithed to  dOxycycline for LN under arm   THis was on 9/4   After taking the first dose she developed severe chest discomfort  Sharp, stabbing To L arm    Lasted a couple hours    Alos pain in back  Hx of back problems   Complained of SOB since that time   CT of chest done   Small pericardial effusion    Lungs with focal ground glass on RIGHT lateral base.  Minimal atherosclerosis of aorta noted    EKG done   No acute changes    After eased off, pt went home   FInished ABX    Has not had pain since  I saw the pt in Sept 2020 echocardiogram was done after this visit which was normal systolic function; mild diastolic dysfunciotn.  She went on to have a coronary CT angiogram that showed mild plaquing with calcium score of 1.9.  60th percentile for age.   The pt also had PFTs done that were normal  Since then she continues to have dyspnea with exertion   She denies CP    Says her breathing difficulties all date back to the end of summer/early fall.  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 aspirin 81 MG chewable tablet Chew 81 mg by mouth at bedtime.   Marland Kitchen CALCIUM PO Take 1 tablet by mouth daily.  . cetirizine (ZYRTEC) 10 MG tablet Take 1 tablet (10 mg total) by mouth daily.  . Cholecalciferol (VITAMIN D3) 25 MCG (1000 UT) CAPS Take 2,000 Units by mouth 2 (two) times daily.  . famotidine (PEPCID) 20 MG tablet Take 20 mg by mouth 2 (two) times daily.  . furosemide (LASIX) 40 MG tablet Take  40 mg by mouth daily.   . hydrOXYzine HCl (ATARAX PO) Take by mouth as needed.  Marland Kitchen lisinopril-hydrochlorothiazide (ZESTORETIC) 20-25 MG tablet Take 1 tablet by mouth daily.  Marland Kitchen MAGNESIUM PO Take 1 tablet by mouth daily.  . meloxicam (MOBIC) 15 MG tablet Take 1 tablet by mouth once daily  . Omega-3 Fatty Acids (FISH OIL) 1000 MG CAPS Take 2,000 capsules by mouth daily.   . SUPER B COMPLEX/C PO Take by mouth.  . Turmeric 500 MG CAPS Take by mouth.  Marland Kitchen UNABLE TO FIND Take 2 tablets by mouth every morning. Med Name: [name]   . vitamin C (ASCORBIC ACID) 500 MG tablet Take 500 mg by mouth daily.  . Zinc 15 MG CAPS Take 15 mg by mouth daily.   Current Facility-Administered Medications for the 06/03/19 encounter (Office Visit) with Fay Records, MD  Medication  . nortriptyline (PAMELOR) capsule 10 mg     Allergies:   Atorvastatin, Doxycycline, and Neurontin [gabapentin]   Past Medical History:  Diagnosis Date  . Hyperlipemia   . Hypertension  Past Surgical History:  Procedure Laterality Date  . ABDOMINAL HYSTERECTOMY    . BACK SURGERY    . HEEL SPUR EXCISION    . KNEE ARTHROSCOPY WITH LATERAL MENISECTOMY Right 10/18/2015   Procedure: RIGHT KNEE ARTHROSCOPY WITH LATERAL MENISECTOMY;  Surgeon: [name], MD;  Location: [facility];  Service: Orthopedics;  Laterality: Right;  . TONSILLECTOMY       Social History:  The patient  reports that she quit smoking about 44 years ago. She started smoking about 44 years ago. She has a 0.03 pack-year smoking history. She has never used smokeless tobacco. She reports that she does not drink alcohol or use drugs.   Family History:  The patient's family history includes Asthma in her mother; Atrial fibrillation in her mother; COPD in her mother; Cancer in her father; Hyperlipidemia in her brother, brother, and brother; Leukemia in her mother; Stroke in her mother.    ROS:  Please see the history of present illness. All other systems  are reviewed and  Negative to the above problem except as noted.    PHYSICAL EXAM: VS:  BP 112/62   Pulse 91   Ht '5\' 8"'  (1.727 m)   Wt (!) 314 lb 6.4 oz (142.6 kg)   SpO2 98%   BMI 47.80 kg/m   GEN: Morbidly obese 63 yo in no acute distress  HEENT: normal  Neck: JVP  Cardiac: RRR; no murmurs, rubs, or gallops,no edema  Respiratory:  clear to auscultation bilaterally, normal work of breathing GI: soft, nontender, nondistended, + BS  No hepatomegaly  MS: no deformity Moving all extremities   Skin: warm and dry, no rash Neuro:  Strength and sensation are intact Psych: euthymic mood, full affect   EKG:  EKG is not  ordered today   Lipid Panel    Component Value Date/Time   CHOL 219 (H) 06/03/2019 1032   TRIG 261 (H) 06/03/2019 1032   HDL 53 06/03/2019 1032   CHOLHDL 4.1 06/03/2019 1032   LDLCALC 120 (H) 06/03/2019 1032      Wt Readings from Last 3 Encounters:  06/03/19 (!) 314 lb 6.4 oz (142.6 kg)  05/25/19 (!) 319 lb 6.4 oz (144.9 kg)  05/03/19 (!) 319 lb 9.6 oz (145 kg)      ASSESSMENT AND PLAN:  1  CHest pain   Pt dnies   Biggest complaint is dysponea      Dyspnea   THis all dates back to late summer/early fall  ? Exposure  Work up:  Mild diastolic dysfunciton   I do not think this explains  CT scan as noted.   PFTs normal    WIll check ESR, CRP, ANA   Volume looks ok but with pt's size it is difficlut   Will get BNP    ? Related to ACE I   Normal PFTs may go against this     2   HL  Will need lipids   Pt should be on statin    3  HTN  BP is controlled     Current medicines are reviewed at length with the patient today.  The patient does not have concerns regarding medicines.  Signed, Dorris Carnes, MD  06/05/2019 10:03 PM    [address], [place], [place]  [address] Phone: [phone]; Fax: [phone]

## 2019-06-02 NOTE — Telephone Encounter (Signed)
Result Notes for CT CORONARY MORPH W/CTA COR W/SCORE W/CA W/CM &/OR WO/CM  Notes recorded by Fay Records, MD on 06/01/2019 at 9:13 PM EST  Coronary CT shows minimal plaquing  Not signifcant to limit flow  Givent that it is present she should have cholesterol checked  Get lipds  Should be on statin       Left message for patient to call back.

## 2019-06-02 NOTE — Telephone Encounter (Signed)
Patient has called again for CT results.

## 2019-06-03 ENCOUNTER — Other Ambulatory Visit: Payer: Self-pay

## 2019-06-03 ENCOUNTER — Encounter (INDEPENDENT_AMBULATORY_CARE_PROVIDER_SITE_OTHER): Payer: Self-pay

## 2019-06-03 ENCOUNTER — Telehealth: Payer: Self-pay | Admitting: *Deleted

## 2019-06-03 ENCOUNTER — Ambulatory Visit: Payer: BC Managed Care – PPO | Admitting: Internal Medicine

## 2019-06-03 ENCOUNTER — Encounter: Payer: Self-pay | Admitting: Internal Medicine

## 2019-06-03 VITALS — BP 112/62 | HR 91 | Ht 68.0 in | Wt 314.4 lb

## 2019-06-03 DIAGNOSIS — R0602 Shortness of breath: Secondary | ICD-10-CM

## 2019-06-03 DIAGNOSIS — Z79899 Other long term (current) drug therapy: Secondary | ICD-10-CM

## 2019-06-03 LAB — SEDIMENTATION RATE: Sed Rate: 31 mm/hr (ref 0–40)

## 2019-06-03 NOTE — Telephone Encounter (Signed)
Pt.notified

## 2019-06-03 NOTE — Telephone Encounter (Signed)
-----   Message from Dorris Carnes V, MD sent at 06/01/2019  9:13 PM EST ----- Coronary CT shows minimal plaquing   Not signifcant to limit flow Givent that it is present she should have cholesterol checked    Get lipds   Should be on statin

## 2019-06-03 NOTE — Patient Instructions (Signed)
Medication Instructions:  No changes *If you need a refill on your cardiac medications before your next appointment, please call your pharmacy*  Lab Work: Today: lipids, sed rate, bmet, crp, ana, bnp If you have labs (blood work) drawn today and your tests are completely normal, you will receive your results only by: Marland Kitchen MyChart Message (if you have MyChart) OR . A paper copy in the mail If you have any lab test that is abnormal or we need to change your treatment, we will call you to review the results.  Testing/Procedures: None ordered  We will contact you with lab results and recommended follow up.

## 2019-06-04 LAB — LIPID PANEL
Chol/HDL Ratio: 4.1 ratio (ref 0.0–4.4)
Cholesterol, Total: 219 mg/dL — ABNORMAL HIGH (ref 100–199)
HDL: 53 mg/dL (ref >39)
LDL Chol Calc (NIH): 120 mg/dL — ABNORMAL HIGH (ref 0–99)
Triglycerides: 261 mg/dL — ABNORMAL HIGH (ref 0–149)
VLDL Cholesterol Cal: 46 mg/dL — ABNORMAL HIGH (ref 5–40)

## 2019-06-04 LAB — BASIC METABOLIC PANEL
BUN/Creatinine Ratio: 30 — ABNORMAL HIGH (ref 12–28)
BUN: 30 mg/dL — ABNORMAL HIGH (ref 8–27)
CO2: 28 mmol/L (ref 20–29)
Calcium: 9.6 mg/dL (ref 8.7–10.3)
Chloride: 95 mmol/L — ABNORMAL LOW (ref 96–106)
Creatinine, Ser: 0.99 mg/dL (ref 0.57–1.00)
GFR calc Af Amer: 71 mL/min/{1.73_m2} (ref >59)
GFR calc non Af Amer: 61 mL/min/{1.73_m2} (ref >59)
Glucose: 132 mg/dL — ABNORMAL HIGH (ref 65–99)
Potassium: 3.3 mmol/L — ABNORMAL LOW (ref 3.5–5.2)
Sodium: 139 mmol/L (ref 134–144)

## 2019-06-04 LAB — ANA: Anti Nuclear Antibody (ANA): NEGATIVE

## 2019-06-04 LAB — PRO B NATRIURETIC PEPTIDE: NT-Pro BNP: 32 pg/mL (ref 0–287)

## 2019-06-04 LAB — C-REACTIVE PROTEIN: CRP: 8 mg/L (ref 0–10)

## 2019-06-07 ENCOUNTER — Other Ambulatory Visit: Payer: Self-pay | Admitting: *Deleted

## 2019-06-07 MED ORDER — POTASSIUM CHLORIDE ER 10 MEQ PO TBCR: 10.0000 meq | EXTENDED_RELEASE_TABLET | Freq: Every day | ORAL | 3 refills | Status: DC

## 2019-06-08 ENCOUNTER — Ambulatory Visit: Payer: BC Managed Care – PPO | Admitting: Pulmonary Disease

## 2019-06-22 ENCOUNTER — Other Ambulatory Visit: Payer: Self-pay | Admitting: Allergy & Immunology

## 2019-06-25 ENCOUNTER — Other Ambulatory Visit: Payer: Self-pay | Admitting: Allergy & Immunology

## 2019-07-01 ENCOUNTER — Ambulatory Visit: Payer: BC Managed Care – PPO | Admitting: Internal Medicine

## 2019-07-06 ENCOUNTER — Ambulatory Visit: Payer: BC Managed Care – PPO | Admitting: Orthopedic Surgery

## 2019-07-25 ENCOUNTER — Other Ambulatory Visit: Payer: Self-pay | Admitting: Orthopedic Surgery

## 2019-07-25 DIAGNOSIS — M1710 Unilateral primary osteoarthritis, unspecified knee: Secondary | ICD-10-CM

## 2019-08-17 ENCOUNTER — Encounter: Payer: Self-pay | Admitting: Internal Medicine

## 2019-08-17 ENCOUNTER — Other Ambulatory Visit: Payer: Self-pay | Admitting: Internal Medicine

## 2019-08-17 NOTE — Progress Notes (Signed)
Cardiology Office Note   Date:  08/19/2019   ID:  Meagan Reyes, DOB Jan 09, 1956, MRN [id number]  PCP:  Redmond School, MD  Cardiologist:   Dorris Carnes, MD   Pt presents for F?U of dyspnea     History of Present Illness: Meagan Reyes is a 64 y.o. female with a history of morbid obesity  Followed by Dr Gerarda Fraction   I saw her in September  She Put Says back in Aug she was put on cephalexin then swithed to  dOxycycline for LN under arm   THis was on 9/4   After taking the first dose she developed severe chest discomfort  Sharp, stabbing To L arm    Lasted a couple hours    Alos pain in back  Hx of back problems   Complained of SOB since that time   I saw the pt in Sept 2020 echocardiogram was done after this visit which was normal systolic function; mild diastolic dysfunciotn.  She went on to have a coronary CT angiogram that showed mild plaquing with calcium score of 1.9.  60th percentile for age.   The pt also had PFTs done that were normal  Since I saw her, she continues to have dyspnea on exertion.  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  She denies chest pain.  L last saw the pt in Nov 2020  Current Meds  Medication Sig  . aspirin 81 MG chewable tablet Chew 81 mg by mouth at bedtime.   Marland Kitchen CALCIUM PO Take 1 tablet by mouth daily.  . cetirizine (ZYRTEC) 10 MG tablet Take 1 tablet (10 mg total) by mouth daily.  . Cholecalciferol (VITAMIN D3) 25 MCG (1000 UT) CAPS Take 2,000 Units by mouth 2 (two) times daily.  . famotidine (PEPCID) 20 MG tablet Take 20 mg by mouth 2 (two) times daily.  . furosemide (LASIX) 40 MG tablet Take 40 mg by mouth daily.   . hydrOXYzine HCl (ATARAX PO) Take by mouth as needed.  Marland Kitchen lisinopril-hydrochlorothiazide (ZESTORETIC) 20-25 MG tablet Take 1 tablet by mouth daily.  Marland Kitchen MAGNESIUM PO Take 1 tablet by mouth daily.  . meloxicam (MOBIC) 15 MG tablet Take 1 tablet by mouth once daily  . Omega-3 Fatty Acids  (FISH OIL) 1000 MG CAPS Take 2,000 capsules by mouth daily.   . potassium chloride (KLOR-CON) 10 MEQ tablet Take 1 tablet (10 mEq total) by mouth daily.  . SUPER B COMPLEX/C PO Take by mouth.  . Turmeric 500 MG CAPS Take by mouth.  Marland Kitchen UNABLE TO FIND Take 2 tablets by mouth every morning. Med Name: [name]   . vitamin C (ASCORBIC ACID) 500 MG tablet Take 500 mg by mouth daily.  . Zinc 15 MG CAPS Take 15 mg by mouth daily.   Current Facility-Administered Medications for the 08/19/19 encounter (Office Visit) with Fay Records, MD  Medication  . nortriptyline (PAMELOR) capsule 10 mg     Allergies:   Atorvastatin, Doxycycline, and Neurontin [gabapentin]   Past Medical History:  Diagnosis Date  . Hyperlipemia   . Hypertension     Past Surgical History:  Procedure Laterality Date  . ABDOMINAL HYSTERECTOMY    . BACK SURGERY    . HEEL SPUR EXCISION    . KNEE ARTHROSCOPY WITH LATERAL MENISECTOMY Right 10/18/2015   Procedure: RIGHT KNEE ARTHROSCOPY WITH LATERAL MENISECTOMY;  Surgeon: [name], MD;  Location: [facility];  Service: Orthopedics;  Laterality: Right;  . TONSILLECTOMY       Social History:  The patient  reports that she quit smoking about 45 years ago. She started smoking about 45 years ago. She has a 0.03 pack-year smoking history. She has never used smokeless tobacco. She reports that she does not drink alcohol or use drugs.   Family History:  The patient's family history includes Asthma in her mother; Atrial fibrillation in her mother; COPD in her mother; Cancer in her father; Hyperlipidemia in her brother, brother, and brother; Leukemia in her mother; Stroke in her mother.    ROS:  Please see the history of present illness. All other systems are reviewed and  Negative to the above problem except as noted.    PHYSICAL EXAM: VS:  BP (!) 142/75   Pulse 68   Temp (!) 97 F (36.1 C) (Temporal)   Ht 5\' 8"  (1.727 m)   Wt (!) 319 lb (144.7 kg)   SpO2 100%    BMI 48.50 kg/m   Oxygen saturation with walking is 94% GEN: Morbidly obese 64 yo in no acute distress  HEENT: normal  Neck: JVP difficult to assess Cardiac: RRR; no murmurs, rubs, or gallops,  Triv edema  Respiratory:  clear to auscultation bilaterally, normal work of breathing GI: soft, nontender, nondistended, + BS  No hepatomegaly  MS: no deformity Moving all extremities   Skin: warm and dry, no rash Neuro:  Strength and sensation are intact Psych: euthymic mood, full affect   EKG:  EKG is not  ordered today   Lipid Panel    Component Value Date/Time   CHOL 219 (H) 06/03/2019 1032   TRIG 261 (H) 06/03/2019 1032   HDL 53 06/03/2019 1032   CHOLHDL 4.1 06/03/2019 1032   LDLCALC 120 (H) 06/03/2019 1032      Wt Readings from Last 3 Encounters:  08/19/19 (!) 319 lb (144.7 kg)  06/03/19 (!) 314 lb 6.4 oz (142.6 kg)  05/25/19 (!) 319 lb 6.4 oz (144.9 kg)      ASSESSMENT AND PLAN:  1  CHest pain   Pt dnies   Biggest complaint is dysponea  2.  Dyspnea   continues to have dyspnea with activity.  Sats are at 26 with walking.  She said this is an abrupt onset overall in September we will set her up for a VQ scan to rule out chronic pulmonary emboli  2   HL patient had labs drawn.  We will get them from [facility] not available now  3  HTN  BP is minimally elevated 140 to follow for now.  Encouraged weight loss.  4.  Morbid obesity.  Patient is trying to lose weight cutting back on her p.o. intake.  Would do better if she could exercise more if her breathing improves.  Futher follow up will be based on test results   Only other test would be R heart cath to confrim pressures  Echo though did not sug signif pulmonary HTN   Current medicines are reviewed at length with the patient today.  The patient does not have concerns regarding medicines.  Signed, Dorris Carnes, MD  08/19/2019 9:39 AM    [address], [place], [place]  [address] Phone: [phone]; Fax: [phone]

## 2019-08-18 LAB — LIPID PANEL W/O CHOL/HDL RATIO
Cholesterol, Total: 217 mg/dL — ABNORMAL HIGH (ref 100–199)
HDL: 55 mg/dL (ref >39)
LDL Chol Calc (NIH): 130 mg/dL — ABNORMAL HIGH (ref 0–99)
Triglycerides: 183 mg/dL — ABNORMAL HIGH (ref 0–149)
VLDL Cholesterol Cal: 32 mg/dL (ref 5–40)

## 2019-08-19 ENCOUNTER — Ambulatory Visit (INDEPENDENT_AMBULATORY_CARE_PROVIDER_SITE_OTHER): Payer: 59 | Admitting: Internal Medicine

## 2019-08-19 ENCOUNTER — Encounter: Payer: Self-pay | Admitting: Internal Medicine

## 2019-08-19 ENCOUNTER — Other Ambulatory Visit: Payer: Self-pay

## 2019-08-19 VITALS — BP 142/75 | HR 68 | Temp 97.0°F | Ht 68.0 in | Wt 319.0 lb

## 2019-08-19 DIAGNOSIS — R0989 Other specified symptoms and signs involving the circulatory and respiratory systems: Secondary | ICD-10-CM | POA: Diagnosis not present

## 2019-08-19 NOTE — Patient Instructions (Signed)
Medication Instructions:  Your physician recommends that you continue on your current medications as directed. Please refer to the Current Medication list given to you today.  *If you need a refill on your cardiac medications before your next appointment, please call your pharmacy*  Lab Work: NONE  If you have labs (blood work) drawn today and your tests are completely normal, you will receive your results only by: Marland Kitchen MyChart Message (if you have MyChart) OR . A paper copy in the mail If you have any lab test that is abnormal or we need to change your treatment, we will call you to review the results.  Testing/Procedures: VQ Scan   Follow-Up: At [facility], you and your health needs are our priority.  As part of our continuing mission to provide you with exceptional heart care, we have created designated Provider Care Teams.  These Care Teams include your primary Cardiologist (physician) and Advanced Practice Providers (APPs -  Physician Assistants and Nurse Practitioners) who all work together to provide you with the care you need, when you need it.  Your next appointment:    To Be Determined   The format for your next appointment:   In Person  Provider:   Dorris Carnes, MD  Other Instructions Thank you for choosing [facility]!

## 2019-08-23 ENCOUNTER — Encounter (HOSPITAL_COMMUNITY): Payer: Self-pay

## 2019-08-23 ENCOUNTER — Encounter (HOSPITAL_BASED_OUTPATIENT_CLINIC_OR_DEPARTMENT_OTHER)
Admission: RE | Admit: 2019-08-23 | Discharge: 2019-08-23 | Disposition: A | Discharge status: Home / Self Care | Payer: 59 | Source: Ambulatory Visit | Attending: Internal Medicine | Admitting: Internal Medicine

## 2019-08-23 ENCOUNTER — Ambulatory Visit (HOSPITAL_COMMUNITY)
Admission: RE | Admit: 2019-08-23 | Discharge: 2019-08-23 | Disposition: A | Discharge status: Home / Self Care | Payer: 59 | Source: Ambulatory Visit | Attending: Internal Medicine | Admitting: Internal Medicine

## 2019-08-23 ENCOUNTER — Other Ambulatory Visit: Payer: Self-pay

## 2019-08-23 DIAGNOSIS — R0989 Other specified symptoms and signs involving the circulatory and respiratory systems: Secondary | ICD-10-CM | POA: Insufficient documentation

## 2019-08-23 MED ORDER — TECHNETIUM TO 99M ALBUMIN AGGREGATED
1.5000 | Freq: Once | INTRAVENOUS | Status: AC | PRN
  Administered 2019-08-23: 1.62 via INTRAVENOUS

## 2019-08-25 ENCOUNTER — Telehealth: Payer: Self-pay | Admitting: Internal Medicine

## 2019-08-25 DIAGNOSIS — Z79899 Other long term (current) drug therapy: Secondary | ICD-10-CM

## 2019-08-25 NOTE — Telephone Encounter (Signed)
Received msg that pt cannot toleate statins Would recomm trial of Zetia and follow up lipids in 8 wks with AST She has mild plaquing of aorta   Do not want it to continue.

## 2019-08-26 MED ORDER — EZETIMIBE 10 MG PO TABS: 10.0000 mg | ORAL_TABLET | Freq: Every day | ORAL | 11 refills | Status: DC

## 2019-08-26 NOTE — Telephone Encounter (Signed)
Pt notified. Orders placed.

## 2019-08-26 NOTE — Addendum Note (Signed)
Addended by: Levonne Hubert on: 08/26/2019 09:02 AM   Modules accepted: Orders

## 2019-08-26 NOTE — Addendum Note (Signed)
Addended by: Levonne Hubert on: 08/26/2019 09:00 AM   Modules accepted: Orders

## 2019-08-31 ENCOUNTER — Telehealth: Payer: Self-pay

## 2019-08-31 DIAGNOSIS — R0602 Shortness of breath: Secondary | ICD-10-CM

## 2019-08-31 DIAGNOSIS — Z79899 Other long term (current) drug therapy: Secondary | ICD-10-CM

## 2019-08-31 MED ORDER — ROSUVASTATIN CALCIUM 10 MG PO TABS: 10.0000 mg | ORAL_TABLET | Freq: Every day | ORAL | 3 refills | Status: DC

## 2019-08-31 NOTE — Telephone Encounter (Signed)
-----   Message from Bernita Raisin, RN sent at 08/25/2019  3:27 PM EST -----  ----- Message ----- From: Fay Records, MD Sent: 08/23/2019   5:16 PM EST To: Bernita Raisin, RN  LIpids :   LDL 130   Should be lower with very mild plaquing on coronary arteries I would add Crestor 10 mg to regimen   Set up for Lipids and LFTs in 8 wks

## 2019-08-31 NOTE — Telephone Encounter (Signed)
Pt made aware, voiced understanding. Lab orders placed, medication sent to [facility].

## 2019-11-17 ENCOUNTER — Other Ambulatory Visit: Payer: Self-pay | Admitting: Orthopedic Surgery

## 2019-11-17 DIAGNOSIS — M1710 Unilateral primary osteoarthritis, unspecified knee: Secondary | ICD-10-CM

## 2019-12-13 ENCOUNTER — Telehealth: Payer: Self-pay | Admitting: Internal Medicine

## 2019-12-13 NOTE — Telephone Encounter (Signed)
Patient states she is calling to inquire about whether or not she needs to be seen sooner than appointment currently scheduled for 01/06/20 at 1:40 PM with Dr. Harrington Challenger. Please advise.

## 2019-12-14 NOTE — Telephone Encounter (Signed)
Pt was released by pulmonary but still has SOB.She bought a pulse ox and says her sat drops to 85-95% and HR goes up to 110. She will bring pulse ox readings to apt

## 2020-01-06 ENCOUNTER — Other Ambulatory Visit: Payer: Self-pay

## 2020-01-06 ENCOUNTER — Ambulatory Visit (INDEPENDENT_AMBULATORY_CARE_PROVIDER_SITE_OTHER): Payer: 59 | Admitting: Internal Medicine

## 2020-01-06 ENCOUNTER — Encounter: Payer: Self-pay | Admitting: Internal Medicine

## 2020-01-06 VITALS — BP 130/80 | HR 54 | Ht 68.0 in | Wt 319.0 lb

## 2020-01-06 DIAGNOSIS — R0602 Shortness of breath: Secondary | ICD-10-CM

## 2020-01-06 DIAGNOSIS — I5032 Chronic diastolic (congestive) heart failure: Secondary | ICD-10-CM

## 2020-01-06 DIAGNOSIS — I10 Essential (primary) hypertension: Secondary | ICD-10-CM | POA: Diagnosis not present

## 2020-01-06 NOTE — Patient Instructions (Signed)
Medication Instructions:  Your physician recommends that you continue on your current medications as directed. Please refer to the Current Medication list given to you today.  *If you need a refill on your cardiac medications before your next appointment, please call your pharmacy*   Lab Work: Cbc,lipids,bmet, bnp,  If you have labs (blood work) drawn today and your tests are completely normal, you will receive your results only by: Marland Kitchen MyChart Message (if you have MyChart) OR . A paper copy in the mail If you have any lab test that is abnormal or we need to change your treatment, we will call you to review the results.   Testing/Procedures: None today   Follow-Up: At [facility], you and your health needs are our priority.  As part of our continuing mission to provide you with exceptional heart care, we have created designated Provider Care Teams.  These Care Teams include your primary Cardiologist (physician) and Advanced Practice Providers (APPs -  Physician Assistants and Nurse Practitioners) who all work together to provide you with the care you need, when you need it.  We recommend signing up for the patient portal called "MyChart".  Sign up information is provided on this After Visit Summary.  MyChart is used to connect with patients for Virtual Visits (Telemedicine).  Patients are able to view lab/test results, encounter notes, upcoming appointments, etc.  Non-urgent messages can be sent to your provider as well.   To learn more about what you can do with MyChart, go to [email or web address].    Your next appointment:   9 months  The format for your next appointment:   In Person  Provider:   Dorris Carnes, MD   Other Instructions Referral placed to pulmonary, they will contact for apt.      Thank you for choosing [facility] !

## 2020-01-06 NOTE — Progress Notes (Signed)
Cardiology Office Note   Date:  01/06/2020   ID:  Meagan Reyes, DOB [birth year], MRN [id number]  PCP:  Redmond School, MD  Cardiologist:   Dorris Carnes, MD   Pt presents for F?U of dyspnea     History of Present Illness: Meagan Reyes is a 64 y.o. female with a history of morbid obesity  Followed by Dr Gerarda Fraction   I saw her in September  She Put Says back in Aug she was put on cephalexin then swithed to  dOxycycline for LN under arm   THis was on 9/4   After taking the first dose she developed severe chest discomfort  Sharp, stabbing To L arm    Lasted a couple hours    Alos pain in back  Hx of back problems   Complained of SOB since that time   I saw the pt in Sept 2020 echocardiogram was done after this visit which was normal systolic function; mild diastolic dysfunciotn.  She went on to have a coronary CT angiogram that showed mild plaquing with calcium score of 1.9.  60th percentile for age.   The pt also had PFTs done that were normal    L last saw the pt in Nov 2020  Since seen she says her breathing is some better She says she is walking more which may be helping.   She denies CP   Trying to lose wt  Thinking of surgery    Current Meds  Medication Sig  . aspirin 81 MG chewable tablet Chew 81 mg by mouth at bedtime.   Marland Kitchen CALCIUM PO Take 1 tablet by mouth daily.  . cetirizine (ZYRTEC) 10 MG tablet Take 1 tablet (10 mg total) by mouth daily.  . Cholecalciferol (VITAMIN D3) 25 MCG (1000 UT) CAPS Take 2,000 Units by mouth 2 (two) times daily.  Marland Kitchen ezetimibe (ZETIA) 10 MG tablet Take 1 tablet (10 mg total) by mouth daily.  . famotidine (PEPCID) 20 MG tablet Take 20 mg by mouth 2 (two) times daily.  . furosemide (LASIX) 40 MG tablet Take 40 mg by mouth daily.   . hydrOXYzine HCl (ATARAX PO) Take by mouth as needed.  Marland Kitchen lisinopril-hydrochlorothiazide (ZESTORETIC) 20-25 MG tablet Take 1 tablet by mouth daily.  Marland Kitchen MAGNESIUM PO Take 1 tablet by mouth daily.  . meloxicam (MOBIC) 15 MG  tablet Take 1 tablet by mouth once daily  . Omega-3 Fatty Acids (FISH OIL) 1000 MG CAPS Take 2,000 capsules by mouth daily.   . potassium chloride (KLOR-CON) 10 MEQ tablet Take 1 tablet (10 mEq total) by mouth daily.  . rosuvastatin (CRESTOR) 10 MG tablet Take 1 tablet (10 mg total) by mouth daily.  . SUPER B COMPLEX/C PO Take by mouth.  . Turmeric 500 MG CAPS Take by mouth.  Marland Kitchen UNABLE TO FIND Take 2 tablets by mouth every morning. Med Name: Meagan Reyes   . vitamin C (ASCORBIC ACID) 500 MG tablet Take 500 mg by mouth daily.  . Zinc 15 MG CAPS Take 15 mg by mouth daily.   Current Facility-Administered Medications for the 01/06/20 encounter (Office Visit) with Fay Records, MD  Medication  . nortriptyline (PAMELOR) capsule 10 mg     Allergies:   Atorvastatin, Doxycycline, and Neurontin [gabapentin]   Past Medical History:  Diagnosis Date  . Hyperlipemia   . Hypertension     Past Surgical History:  Procedure Laterality Date  . ABDOMINAL HYSTERECTOMY    . BACK  SURGERY    . HEEL SPUR EXCISION    . KNEE ARTHROSCOPY WITH LATERAL MENISECTOMY Right 10/18/2015   Procedure: RIGHT KNEE ARTHROSCOPY WITH LATERAL MENISECTOMY;  Surgeon: [name], MD;  Location: [facility];  Service: Orthopedics;  Laterality: Right;  . TONSILLECTOMY       Social History:  The patient  reports that she quit smoking about 45 years ago. She started smoking about 45 years ago. She has a 0.03 pack-year smoking history. She has never used smokeless tobacco. She reports that she does not drink alcohol and does not use drugs.   Family History:  The patient's family history includes Asthma in her mother; Atrial fibrillation in her mother; COPD in her mother; Cancer in her father; Hyperlipidemia in her brother, brother, and brother; Leukemia in her mother; Stroke in her mother.    ROS:  Please see the history of present illness. All other systems are reviewed and  Negative to the above problem except as noted.     PHYSICAL EXAM: VS:  BP 130/80   Pulse (!) 54   Ht 5\' 8"  (1.727 m)   Wt (!) 319 lb (144.7 kg)   SpO2 96%   BMI 48.50 kg/m   Oxygen saturation with walking is 96% GEN: Morbidly obese 64 yo in no acute distress  HEENT: normal  Neck: JVP does not appear elevated   Cardiac: RRR; no murmurs, rubs, or gallops,  No signif edema  Respiratory:  clear to auscultation bilaterally, normal work of breathing GI: soft, nontender, nondistended, + BS  No hepatomegaly  MS: no deformity Moving all extremities   Skin: warm and dry, no rash Neuro:  Strength and sensation are intact Psych: euthymic mood, full affect   EKG:  EKG is ordered today  Sinus bradycardia 54 bpm     Lipid Panel    Component Value Date/Time   CHOL 217 (H) 08/17/2019 0838   TRIG 183 (H) 08/17/2019 0838   HDL 55 08/17/2019 0838   CHOLHDL 4.1 06/03/2019 1032   LDLCALC 130 (H) 08/17/2019 0838      Wt Readings from Last 3 Encounters:  01/06/20 (!) 319 lb (144.7 kg)  08/19/19 (!) 319 lb (144.7 kg)  06/03/19 (!) 314 lb 6.4 oz (142.6 kg)      ASSESSMENT AND PLAN:  1 .  Dyspnea   Extensive work up negative    Thinks it is getting better with walking   I encoruaged her to stay acitve  2   HL   Will get lipids    Continue Crestor    3  HTN  BP is normal   Keep on meds    4.  Morbid obesity. Encouraged her to lose wt   Would favor stomach rapping if she wats    Stay active   Will check:  CBC, BMET, BNP, lipids   Signed, Dorris Carnes, MD  01/06/2020 12:26 PM    [place], [place],   [address] Phone: [phone]; Fax: [phone]

## 2020-02-18 ENCOUNTER — Other Ambulatory Visit: Payer: Self-pay | Admitting: Orthopedic Surgery

## 2020-02-18 DIAGNOSIS — M1710 Unilateral primary osteoarthritis, unspecified knee: Secondary | ICD-10-CM

## 2020-03-02 ENCOUNTER — Other Ambulatory Visit: Payer: Self-pay | Admitting: Internal Medicine

## 2020-03-02 ENCOUNTER — Other Ambulatory Visit (HOSPITAL_COMMUNITY): Payer: Self-pay | Admitting: Internal Medicine

## 2020-03-02 DIAGNOSIS — R945 Abnormal results of liver function studies: Secondary | ICD-10-CM

## 2020-03-09 ENCOUNTER — Encounter: Payer: Self-pay | Admitting: Internal Medicine

## 2020-03-13 ENCOUNTER — Encounter (HOSPITAL_COMMUNITY): Payer: Self-pay

## 2020-03-13 ENCOUNTER — Ambulatory Visit (HOSPITAL_COMMUNITY): Admission: RE | Admit: 2020-03-13 | Payer: 59 | Source: Ambulatory Visit

## 2020-03-14 ENCOUNTER — Other Ambulatory Visit: Payer: Self-pay

## 2020-03-14 ENCOUNTER — Ambulatory Visit (HOSPITAL_COMMUNITY)
Admission: RE | Admit: 2020-03-14 | Discharge: 2020-03-14 | Disposition: A | Discharge status: Home / Self Care | Payer: 59 | Source: Ambulatory Visit | Attending: Internal Medicine | Admitting: Internal Medicine

## 2020-03-14 DIAGNOSIS — R945 Abnormal results of liver function studies: Secondary | ICD-10-CM

## 2020-03-30 ENCOUNTER — Encounter (HOSPITAL_COMMUNITY): Payer: 59

## 2020-04-05 ENCOUNTER — Encounter (HOSPITAL_COMMUNITY): Payer: Self-pay

## 2020-04-05 ENCOUNTER — Encounter (HOSPITAL_COMMUNITY)
Admission: RE | Admit: 2020-04-05 | Discharge: 2020-04-05 | Disposition: A | Discharge status: Home / Self Care | Payer: 59 | Source: Ambulatory Visit | Attending: Internal Medicine | Admitting: Internal Medicine

## 2020-04-05 ENCOUNTER — Other Ambulatory Visit: Payer: Self-pay

## 2020-04-05 VITALS — BP 126/80 | HR 62 | Ht 68.0 in | Wt 313.3 lb

## 2020-04-05 DIAGNOSIS — R0602 Shortness of breath: Secondary | ICD-10-CM

## 2020-04-05 NOTE — Progress Notes (Signed)
Pulmonary Individual Treatment Plan  Patient Details  Name: Meagan Reyes MRN: [id number] Date of Birth: [birth year] Referring Provider:     PULMONARY REHAB OTHER RESP ORIENTATION from 04/05/2020 in [facility]  Referring Provider Dr. Harrington Challenger      Initial Encounter Date:    [name] from 04/05/2020 in [facility]  Date 04/05/20      Visit Diagnosis: SOB (shortness of breath)  Patient's Home Medications on Admission:   Current Outpatient Medications:  .  pantoprazole (PROTONIX) 40 MG tablet, Take 40 mg by mouth daily., Disp: , Rfl:  .  Vitamins-Lipotropics (MEGA-CHOL PO), Take 4 capsules by mouth 2 (two) times daily with a meal. Takes Doterra EO mega fish oil, Disp: , Rfl:  .  aspirin 81 MG chewable tablet, Chew 81 mg by mouth at bedtime. , Disp: , Rfl:  .  CALCIUM PO, Take 1 tablet by mouth daily., Disp: , Rfl:  .  cetirizine (ZYRTEC) 10 MG tablet, Take 1 tablet (10 mg total) by mouth daily. (Patient not taking: Reported on 04/05/2020), Disp: 60 tablet, Rfl: 5 .  Cholecalciferol (VITAMIN D3) 25 MCG (1000 UT) CAPS, Take 2,000 Units by mouth 2 (two) times daily., Disp: , Rfl:  .  famotidine (PEPCID) 20 MG tablet, Take 20 mg by mouth 2 (two) times daily. (Patient not taking: Reported on 04/05/2020), Disp: , Rfl:  .  furosemide (LASIX) 40 MG tablet, Take 40 mg by mouth daily. , Disp: , Rfl: 0 .  hydrOXYzine HCl (ATARAX PO), Take by mouth as needed. (Patient not taking: Reported on 04/05/2020), Disp: , Rfl:  .  lisinopril-hydrochlorothiazide (ZESTORETIC) 20-25 MG tablet, Take 1 tablet by mouth daily., Disp: , Rfl:  .  MAGNESIUM PO, Take 1 tablet by mouth daily., Disp: , Rfl:  .  meloxicam (MOBIC) 15 MG tablet, Take 1 tablet by mouth once daily, Disp: 90 tablet, Rfl: 0 .  Omega-3 Fatty Acids (FISH OIL) 1000 MG CAPS, Take 2,000 capsules by mouth daily.  (Patient not taking: Reported on 04/05/2020), Disp: , Rfl:  .  potassium  chloride (KLOR-CON) 10 MEQ tablet, Take 1 tablet (10 mEq total) by mouth daily. (Patient not taking: Reported on 04/05/2020), Disp: 90 tablet, Rfl: 3 .  SUPER B COMPLEX/C PO, Take by mouth., Disp: , Rfl:  .  triamcinolone cream (KENALOG) 0.1 %, Apply 1 application topically 2 (two) times daily., Disp: , Rfl:  .  Turmeric 500 MG CAPS, Take by mouth., Disp: , Rfl:  .  UNABLE TO FIND, Take 2 tablets by mouth every morning. Med Name: Moringa Oleifera , Disp: , Rfl:  .  vitamin C (ASCORBIC ACID) 500 MG tablet, Take 500 mg by mouth daily., Disp: , Rfl:  .  Zinc 15 MG CAPS, Take 15 mg by mouth daily., Disp: , Rfl:   Current Facility-Administered Medications:  .  nortriptyline (PAMELOR) capsule 10 mg, 10 mg, Oral, QHS, [name], MD  Past Medical History: Past Medical History:  Diagnosis Date  . Hyperlipemia   . Hypertension     Tobacco Use: Social History   Tobacco Use  Smoking Status Former Smoker  . Packs/day: 0.10  . Years: 0.25  . Pack years: 0.02  . Start date: 59  . Quit date: 37  . Years since quitting: 45.7  Smokeless Tobacco Never Used  Tobacco Comment   a couple months when 85 then stopped    Labs: Recent Review Scientist, physiological  Labs for ITP Cardiac and Pulmonary Rehab Latest Ref Rng & Units 06/03/2019 08/17/2019   Cholestrol 100 - 199 mg/dL 219(H) 217(H)   LDLCALC 0 - 99 mg/dL 120(H) 130(H)   HDL >39 mg/dL 53 55   Trlycerides 0 - 149 mg/dL 261(H) 183(H)      Capillary Blood Glucose: No results found for: GLUCAP   Pulmonary Assessment Scores:  Pulmonary Assessment Scores    Row Name 04/05/20 1450         ADL UCSD   ADL Phase Entry     SOB Score total 70     Rest 1     Walk 3     Stairs 4     Bath 3     Dress 3     Shop 5       CAT Score   CAT Score 15       mMRC Score   mMRC Score 3           UCSD: Self-administered rating of dyspnea associated with activities of daily living (ADLs) 6-point scale (0 = "not at all" to 5 =  "maximal or unable to do because of breathlessness")  Scoring Scores range from 0 to 120.  Minimally important difference is 5 units  CAT: CAT can identify the health impairment of COPD patients and is better correlated with disease progression.  CAT has a scoring range of zero to 40. The CAT score is classified into four groups of low (less than 10), medium (10 - 20), high (21-30) and very high (31-40) based on the impact level of disease on health status. A CAT score over 10 suggests significant symptoms.  A worsening CAT score could be explained by an exacerbation, poor medication adherence, poor inhaler technique, or progression of COPD or comorbid conditions.  CAT MCID is 2 points  mMRC: mMRC (Modified Medical Research Council) Dyspnea Scale is used to assess the degree of baseline functional disability in patients of respiratory disease due to dyspnea. No minimal important difference is established. A decrease in score of 1 point or greater is considered a positive change.   Pulmonary Function Assessment:   Exercise Target Goals: Exercise Program Goal: Individual exercise prescription set using results from initial 6 min walk test and THRR while considering  patient's activity barriers and safety.   Exercise Prescription Goal: Initial exercise prescription builds to 30-45 minutes a day of aerobic activity, 2-3 days per week.  Home exercise guidelines will be given to patient during program as part of exercise prescription that the participant will acknowledge.  Activity Barriers & Risk Stratification:   6 Minute Walk:  6 Minute Walk    Row Name 04/05/20 1450         6 Minute Walk   Phase Initial     Distance 1000 feet     Walk Time 6 minutes     # of Rest Breaks 0     MPH 1.9     METS 1.68     RPE 5.89     Perceived Dyspnea  13     VO2 Peak 9     Symptoms No     Resting HR 62 bpm     Resting BP 126/88     Resting Oxygen Saturation  97 %     Exercise Oxygen  Saturation  during 6 min walk 94 %     Max Ex. HR 107 bpm     Max Ex. BP 136/70  2 Minute Post BP 124/70       Interval HR   1 Minute HR 95     2 Minute HR 107     3 Minute HR 102     4 Minute HR 95     5 Minute HR 96     6 Minute HR 96     2 Minute Post HR 61     Interval Heart Rate? Yes       Interval Oxygen   Interval Oxygen? Yes     Baseline Oxygen Saturation % 97 %     1 Minute Oxygen Saturation % 95 %     1 Minute Liters of Oxygen 0 L     2 Minute Oxygen Saturation % 95 %     2 Minute Liters of Oxygen 0 L     3 Minute Oxygen Saturation % 94 %     3 Minute Liters of Oxygen 0 L     4 Minute Oxygen Saturation % 99 %     4 Minute Liters of Oxygen 0 L     5 Minute Oxygen Saturation % 100 %     5 Minute Liters of Oxygen 0 L     6 Minute Oxygen Saturation % 100 %     6 Minute Liters of Oxygen 0 L     2 Minute Post Oxygen Saturation % 100 %     2 Minute Post Liters of Oxygen 0 L            Oxygen Initial Assessment:  Oxygen Initial Assessment - 04/05/20 1449      Home Oxygen   Home Oxygen Device None    Sleep Oxygen Prescription CPAP    Home Exercise Oxygen Prescription None    Home at Rest Exercise Oxygen Prescription None    Compliance with Home Oxygen Use Yes      Initial 6 min Walk   Oxygen Used None      Program Oxygen Prescription   Program Oxygen Prescription None      Intervention   Short Term Goals To learn and exhibit compliance with exercise, home and travel O2 prescription;To learn and understand importance of monitoring SPO2 with pulse oximeter and demonstrate accurate use of the pulse oximeter.;To learn and understand importance of maintaining oxygen saturations>88%;To learn and demonstrate proper pursed lip breathing techniques or other breathing techniques.;To learn and demonstrate proper use of respiratory medications    Long  Term Goals Exhibits compliance with exercise, home and travel O2 prescription;Verbalizes importance of monitoring  SPO2 with pulse oximeter and return demonstration;Maintenance of O2 saturations>88%;Demonstrates proper use of MDI's;Compliance with respiratory medication;Exhibits proper breathing techniques, such as pursed lip breathing or other method taught during program session           Oxygen Re-Evaluation:   Oxygen Discharge (Final Oxygen Re-Evaluation):   Initial Exercise Prescription:  Initial Exercise Prescription - 04/05/20 1400      Date of Initial Exercise RX and Referring Provider   Date 04/05/20    Referring Provider Dr. Harrington Challenger    Expected Discharge Date 08/09/20      Treadmill   MPH 1.4    Grade 0    Minutes 17      NuStep   Level 1    SPM 80    Minutes 22      Prescription Details   Frequency (times per week) 2    Duration Progress to 30 minutes of continuous aerobic without  signs/symptoms of physical distress      Intensity   Ratings of Perceived Exertion 11-13    Perceived Dyspnea 0-4      Resistance Training   Training Prescription Yes    Weight 2 lbs    Reps 10-15           Perform Capillary Blood Glucose checks as needed.  Exercise Prescription Changes:   Exercise Comments:   Exercise Goals and Review:   Exercise Goals    Row Name 04/05/20 1500             Exercise Goals   Increase Physical Activity Yes       Intervention Provide advice, education, support and counseling about physical activity/exercise needs.;Develop an individualized exercise prescription for aerobic and resistive training based on initial evaluation findings, risk stratification, comorbidities and participant's personal goals.       Expected Outcomes Short Term: Attend rehab on a regular basis to increase amount of physical activity.;Long Term: Add in home exercise to make exercise part of routine and to increase amount of physical activity.;Long Term: Exercising regularly at least 3-5 days a week.       Increase Strength and Stamina Yes       Intervention Provide advice,  education, support and counseling about physical activity/exercise needs.;Develop an individualized exercise prescription for aerobic and resistive training based on initial evaluation findings, risk stratification, comorbidities and participant's personal goals.       Expected Outcomes Short Term: Increase workloads from initial exercise prescription for resistance, speed, and METs.;Short Term: Perform resistance training exercises routinely during rehab and add in resistance training at home;Long Term: Improve cardiorespiratory fitness, muscular endurance and strength as measured by increased METs and functional capacity (6MWT)       Able to understand and use rate of perceived exertion (RPE) scale Yes       Intervention Provide education and explanation on how to use RPE scale       Expected Outcomes Short Term: Able to use RPE daily in rehab to express subjective intensity level;Long Term:  Able to use RPE to guide intensity level when exercising independently       Able to understand and use Dyspnea scale Yes       Intervention Provide education and explanation on how to use Dyspnea scale       Expected Outcomes Short Term: Able to use Dyspnea scale daily in rehab to express subjective sense of shortness of breath during exertion;Long Term: Able to use Dyspnea scale to guide intensity level when exercising independently       Knowledge and understanding of Target Heart Rate Range (THRR) Yes       Intervention Provide education and explanation of THRR including how the numbers were predicted and where they are located for reference       Expected Outcomes Short Term: Able to state/look up THRR;Long Term: Able to use THRR to govern intensity when exercising independently;Short Term: Able to use daily as guideline for intensity in rehab       Understanding of Exercise Prescription Yes       Intervention Provide education, explanation, and written materials on patient's individual exercise prescription        Expected Outcomes Short Term: Able to explain program exercise prescription;Long Term: Able to explain home exercise prescription to exercise independently              Exercise Goals Re-Evaluation :   Discharge Exercise Prescription (Final Exercise Prescription  Changes):   Nutrition:  Target Goals: Understanding of nutrition guidelines, daily intake of sodium 1500mg , cholesterol 200mg , calories 30% from fat and 7% or less from saturated fats, daily to have 5 or more servings of fruits and vegetables.  Biometrics:  Pre Biometrics - 04/05/20 1500      Pre Biometrics   Height 5\' 8"  (1.727 m)    Weight 142.1 kg    Waist Circumference 52 inches    Hip Circumference 63 inches    Waist to Hip Ratio 0.83 %    BMI (Calculated) 47.64    Triceps Skinfold 47 mm    % Body Fat 57.3 %    Grip Strength 34.6 kg    Flexibility 0 in    Single Leg Stand 19.23 seconds            Nutrition Therapy Plan and Nutrition Goals:  Nutrition Therapy & Goals - 04/05/20 1504      Personal Nutrition Goals   Comments Patient scored 36 on her medficts diet assessment scoring highest in eggs. I reviewed her score and provided a handout with discussion about how to make healthier choices. She says she is following a low carb diet. Her LDL is elevated. She says she knows she needs to lose weight; however, that is not one of her primary goals for the program.  Will continue to monitor.      Intervention Plan   Intervention Nutrition handout(s) given to patient.           Nutrition Assessments:  Nutrition Assessments - 04/05/20 1503      MEDFICTS Scores   Pre Score 36           Nutrition Goals Re-Evaluation:   Nutrition Goals Discharge (Final Nutrition Goals Re-Evaluation):   Psychosocial: Target Goals: Acknowledge presence or absence of significant depression and/or stress, maximize coping skills, provide positive support system. Participant is able to verbalize types and ability  to use techniques and skills needed for reducing stress and depression.  Initial Review & Psychosocial Screening:  Initial Psych Review & Screening - 04/05/20 1511      Initial Review   Current issues with None Identified      Family Dynamics   Good Support System? Yes    Comments Patient lives with her husband. She has 4 children and 9 grandchildren. She says she is very involved in their lives and has a great relationship with them. 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 She denies any depression or anxiety. She says she has always had low self-esteem and continues to struggle with this. Will continue to monitor.      Barriers   Psychosocial barriers to participate in program Psychosocial barriers identified (see note)      Screening Interventions   Interventions Encouraged to exercise;Provide feedback about the scores to participant           Quality of Life Scores:  Quality of Life - 04/05/20 1514      Quality of Life   Select Quality of Life      Quality of Life Scores   Health/Function Pre 19.5 %    Socioeconomic Pre 23.71 %    Psych/Spiritual Pre 20.64 %    Family Pre 24 %    GLOBAL Pre 21.21 %          Scores of 19 and below usually indicate a poorer quality of life in these areas.  A difference of  2-3 points is a clinically meaningful difference.  A difference of 2-3 points in the total score of the Quality of Life Index has been associated with significant improvement in overall quality of life, self-image, physical symptoms, and general health in studies assessing change in quality of life.   PHQ-9: Recent Review Flowsheet Data    Depression screen [facility] [address] 2/9 04/05/2020   Decreased Interest 0   Down, Depressed, Hopeless 0   PHQ - 2 Score 0   Altered sleeping 3   Tired, decreased energy 3   Change in appetite 2   Feeling bad or failure about yourself  2   Trouble concentrating 0   Moving slowly  or fidgety/restless 0   Suicidal thoughts 0   PHQ-9 Score 10   Difficult doing work/chores Not difficult at all     Interpretation of Total Score  Total Score Depression Severity:  1-4 = Minimal depression, 5-9 = Mild depression, 10-14 = Moderate depression, 15-19 = Moderately severe depression, 20-27 = Severe depression   Psychosocial Evaluation and Intervention:  Psychosocial Evaluation - 04/05/20 1515      Psychosocial Evaluation & Interventions   Interventions Stress management education;Relaxation education;Encouraged to exercise with the program and follow exercise prescription    Comments Patient's initial QOL score was 21.21 and her PHQ-9 score was 10 with no psychosocial issues identified at her orientation visit. She says she has struggled with a low self-esteem her whole life but has a positive outlook. Will continue to monitor.    Expected Outcomes Patient will have no psychosocial issues identified at discharge.    Continue Psychosocial Services  No Follow up required           Psychosocial Re-Evaluation:   Psychosocial Discharge (Final Psychosocial Re-Evaluation):    Education: Education Goals: Education classes will be provided on a weekly basis, covering required topics. Participant will state understanding/return demonstration of topics presented.  Learning Barriers/Preferences:  Learning Barriers/Preferences - 04/05/20 1503      Learning Barriers/Preferences   Learning Barriers None    Learning Preferences Skilled Demonstration           Education Topics: How Lungs Work and Diseases: - Discuss the anatomy of the lungs and diseases that can affect the lungs, such as COPD.   Exercise: -Discuss the importance of exercise, FITT principles of exercise, normal and abnormal responses to exercise, and how to exercise safely.   Environmental Irritants: -Discuss types of environmental irritants and how to limit exposure to environmental  irritants.   Meds/Inhalers and oxygen: - Discuss respiratory medications, definition of an inhaler and oxygen, and the proper way to use an inhaler and oxygen.   Energy Saving Techniques: - Discuss methods to conserve energy and decrease shortness of breath when performing activities of daily living.    Bronchial Hygiene / Breathing Techniques: - Discuss breathing mechanics, pursed-lip breathing technique,  proper posture, effective ways to clear airways, and other functional breathing techniques   Cleaning Equipment: - Provides group verbal and written instruction about the health risks of elevated stress, cause of high stress, and healthy ways to reduce stress.   Nutrition I: Fats: - Discuss the types of cholesterol, what cholesterol does to the body, and how cholesterol levels can be controlled.   Nutrition II: Labels: -Discuss the different components of food labels and how to read food labels.   Respiratory Infections: - Discuss the signs and symptoms of respiratory infections, ways to prevent respiratory infections, and the  importance of seeking medical treatment when having a respiratory infection.   Stress I: Signs and Symptoms: - Discuss the causes of stress, how stress may lead to anxiety and depression, and ways to limit stress.   Stress II: Relaxation: -Discuss relaxation techniques to limit stress.   Oxygen for Home/Travel: - Discuss how to prepare for travel when on oxygen and proper ways to transport and store oxygen to ensure safety.   Knowledge Questionnaire Score:  Knowledge Questionnaire Score - 04/05/20 1503      Knowledge Questionnaire Score   Pre Score 15/18           Core Components/Risk Factors/Patient Goals at Admission:  Personal Goals and Risk Factors at Admission - 04/05/20 1510      Core Components/Risk Factors/Patient Goals on Admission    Weight Management Obesity    Personal Goal Other Yes    Personal Goal Be able to do her  ADL's without getting SOB.    Intervention Patient will attend PR 2 days/week and supplement with exercise at home 3 days/week.    Expected Outcomes Patient will complete the program meeting her personal and program goals.           Core Components/Risk Factors/Patient Goals Review:    Core Components/Risk Factors/Patient Goals at Discharge (Final Review):    ITP Comments:   Comments: Patient arrived for 1st visit/orientation/education at 1230. Patient was referred to PR by Dorris Carnes due to SOB (R06.02). During orientation advised patient on arrival and appointment times what to wear, what to do before, during and after exercise. Reviewed attendance and class policy. Talked about inclement weather and class consultation policy. Pt is scheduled to return Pulmoanry Rehab on 04/12/20 at 10:45. Pt was advised to come to class 15 minutes before class starts. Discussed RPE/Dpysnea scales. Discussed initial THR and how to find their radial and/or carotid pulse. Discussed the initial exercise prescription and how this effects their progress. Pt is eager to get started. Patient participated in warm up stretches followed by light weights and resistance bands. Patient was able to complete 6 minute walk test. Patient was measured for the equipment. Discussed equipment safety with patient. Took patient pre-anthropometric measurements. Patient finished visit at 1430.

## 2020-04-05 NOTE — Progress Notes (Signed)
Cardiac/Pulmonary Rehab Medication Review by a Pharmacist  Does the patient  feel that his/her medications are working for him/her?  yes  Has the patient been experiencing any side effects to the medications prescribed?  no  Does the patient measure his/her own blood pressure or blood glucose at home?  no   Does the patient have any problems obtaining medications due to transportation or finances?   no  Understanding of regimen: excellent Understanding of indications: excellent Potential of compliance: excellent  Questions asked to Determine Patient Understanding of Medication Regimen:  1. What is the name of the medication?  2. What is the medication used for?  3. When should it be taken?  4. How much should be taken?  5. How will you take it?  6. What side effects should you report?  Understanding Defined as: Excellent: All questions above are correct Good: Questions 1-4 are correct Fair: Questions 1-2 are correct  Poor: 1 or none of the above questions are correct   Pharmacist comments: Patient presents today for pulmonary rehab. She has been having shortness of breath for about a year which her doctors cannot not determine cause. (no cardian or pulmonary dx). She said it all started after starting doxycycline, but she completed the prescription without any further exacerbation.  We reviewed her medications and discussed decreasing dose of meloxicam dose from 15mg  daily  To 7.5mg (half-tab daily).  Patient weighs herself daily as she reports taking lasix. Also, had a RX for K but does not take.  May need to check K+ levels. She did report having cramps in legs at night, especially on hot days. One of her supplements has some K and Magnesium, but not much. Otherwise, she is tolerating her current regimen with out any issues.  Thanks for the opportunity to participate in the care of this patient,  Isac Sarna, BS [place], [place] Clinical Pharmacist Pager [phone]  04/05/2020  1:34 PM

## 2020-04-10 ENCOUNTER — Encounter (HOSPITAL_COMMUNITY): Payer: 59

## 2020-04-10 ENCOUNTER — Encounter (HOSPITAL_COMMUNITY): Payer: Self-pay

## 2020-04-12 ENCOUNTER — Encounter (HOSPITAL_COMMUNITY)
Admission: RE | Admit: 2020-04-12 | Discharge: 2020-04-12 | Disposition: A | Discharge status: Home / Self Care | Payer: 59 | Source: Ambulatory Visit | Attending: Internal Medicine | Admitting: Internal Medicine

## 2020-04-12 ENCOUNTER — Other Ambulatory Visit: Payer: Self-pay

## 2020-04-12 DIAGNOSIS — R0602 Shortness of breath: Secondary | ICD-10-CM

## 2020-04-12 NOTE — Progress Notes (Signed)
Daily Session Note  Patient Details  Name: Meagan Reyes MRN: [id number] Date of Birth: 1955/10/27 Referring Provider:     PULMONARY REHAB OTHER RESP ORIENTATION from 04/05/2020 in [place]  Referring Provider Dr. Harrington Challenger      Encounter Date: 04/12/2020  Check In:  Session Check In - 04/12/20 1234      Check-In   Supervising physician immediately available to respond to emergencies See telemetry face sheet for immediately available MD    Location [facility]    Staff Present Hoy Register, MS, ACSM-CEP, Exercise Physiologist;Debra Wynetta Emery, RN, BSN    Virtual Visit No    Medication changes reported     No    Fall or balance concerns reported    No    Tobacco Cessation No Change    Warm-up and Cool-down Performed as group-led instruction    Resistance Training Performed Yes    VAD Patient? No    PAD/SET Patient? No      Pain Assessment   Currently in Pain? No/denies    Pain Score 0-No pain    Multiple Pain Sites No           Capillary Blood Glucose: No results found for this or any previous visit (from the past 24 hour(s)).    Social History   Tobacco Use  Smoking Status Former Smoker  . Packs/day: 0.10  . Years: 0.25  . Pack years: 0.02  . Start date: 24  . Quit date: 75  . Years since quitting: 45.7  Smokeless Tobacco Never Used  Tobacco Comment   a couple months when 35 then stopped    Goals Met:  Independence with exercise equipment Exercise tolerated well No report of cardiac concerns or symptoms Strength training completed today  Goals Unmet:  Not Applicable  Comments: checkout time is 1145   Dr. Kathie Dike is Medical Director for [facility].

## 2020-04-17 ENCOUNTER — Other Ambulatory Visit: Payer: Self-pay

## 2020-04-17 ENCOUNTER — Encounter (HOSPITAL_COMMUNITY)
Admission: RE | Admit: 2020-04-17 | Discharge: 2020-04-17 | Disposition: A | Discharge status: Home / Self Care | Payer: 59 | Source: Ambulatory Visit | Attending: Internal Medicine | Admitting: Internal Medicine

## 2020-04-17 DIAGNOSIS — R0602 Shortness of breath: Secondary | ICD-10-CM

## 2020-04-17 NOTE — Progress Notes (Signed)
Daily Session Note  Patient Details  Name: Meagan Reyes MRN: [id number] Date of Birth: [birth year] Referring Provider:     PULMONARY REHAB OTHER RESP ORIENTATION from 04/05/2020 in [place]  Referring Provider Dr. Harrington Challenger      Encounter Date: 04/17/2020  Check In:  Session Check In - 04/17/20 1102      Check-In   Supervising physician immediately available to respond to emergencies See telemetry face sheet for immediately available MD    Location [facility]    Staff Present Geanie Cooley, Felipe Drone, RN, [facility]    Virtual Visit No    Medication changes reported     No    Fall or balance concerns reported    No    Tobacco Cessation No Change    Warm-up and Cool-down Performed as group-led instruction    Resistance Training Performed Yes    VAD Patient? No    PAD/SET Patient? No      Pain Assessment   Currently in Pain? No/denies    Pain Score 0-No pain    Multiple Pain Sites No           Capillary Blood Glucose: No results found for this or any previous visit (from the past 24 hour(s)).    Social History   Tobacco Use  Smoking Status Former Smoker  . Packs/day: 0.10  . Years: 0.25  . Pack years: 0.02  . Start date: 71  . Quit date: 60  . Years since quitting: 45.7  Smokeless Tobacco Never Used  Tobacco Comment   a couple months when 19 then stopped    Goals Met:  Proper associated with RPD/PD & O2 Sat Independence with exercise equipment Improved SOB with ADL's Using PLB without cueing & demonstrates good technique Exercise tolerated well Personal goals reviewed No report of cardiac concerns or symptoms Strength training completed today  Goals Unmet:  Not Applicable  Comments: check out @ 11:45   Dr. Kathie Dike is Medical Director for [facility].

## 2020-04-19 ENCOUNTER — Encounter (HOSPITAL_COMMUNITY)
Admission: RE | Admit: 2020-04-19 | Discharge: 2020-04-19 | Disposition: A | Discharge status: Home / Self Care | Payer: 59 | Source: Ambulatory Visit | Attending: Internal Medicine | Admitting: Internal Medicine

## 2020-04-19 ENCOUNTER — Other Ambulatory Visit: Payer: Self-pay

## 2020-04-19 DIAGNOSIS — R0602 Shortness of breath: Secondary | ICD-10-CM

## 2020-04-19 NOTE — Progress Notes (Signed)
Pulmonary Individual Treatment Plan  Patient Details  Name: Meagan Reyes MRN: [id number] Date of Birth: 1956-06-14 Referring Provider:     PULMONARY REHAB OTHER RESP ORIENTATION from 04/05/2020 in [facility]  Referring Provider Dr. Harrington Challenger      Initial Encounter Date:    [name] from 04/05/2020 in [facility]  Date 04/05/20      Visit Diagnosis: SOB (shortness of breath)  Patient's Home Medications on Admission:   Current Outpatient Medications:  .  aspirin 81 MG chewable tablet, Chew 81 mg by mouth at bedtime. , Disp: , Rfl:  .  CALCIUM PO, Take 1 tablet by mouth daily., Disp: , Rfl:  .  cetirizine (ZYRTEC) 10 MG tablet, Take 1 tablet (10 mg total) by mouth daily. (Patient not taking: Reported on 04/05/2020), Disp: 60 tablet, Rfl: 5 .  Cholecalciferol (VITAMIN D3) 25 MCG (1000 UT) CAPS, Take 2,000 Units by mouth 2 (two) times daily., Disp: , Rfl:  .  famotidine (PEPCID) 20 MG tablet, Take 20 mg by mouth 2 (two) times daily. (Patient not taking: Reported on 04/05/2020), Disp: , Rfl:  .  furosemide (LASIX) 40 MG tablet, Take 40 mg by mouth daily. , Disp: , Rfl: 0 .  hydrOXYzine HCl (ATARAX PO), Take by mouth as needed. (Patient not taking: Reported on 04/05/2020), Disp: , Rfl:  .  lisinopril-hydrochlorothiazide (ZESTORETIC) 20-25 MG tablet, Take 1 tablet by mouth daily., Disp: , Rfl:  .  MAGNESIUM PO, Take 1 tablet by mouth daily., Disp: , Rfl:  .  meloxicam (MOBIC) 15 MG tablet, Take 1 tablet by mouth once daily, Disp: 90 tablet, Rfl: 0 .  Omega-3 Fatty Acids (FISH OIL) 1000 MG CAPS, Take 2,000 capsules by mouth daily.  (Patient not taking: Reported on 04/05/2020), Disp: , Rfl:  .  pantoprazole (PROTONIX) 40 MG tablet, Take 40 mg by mouth daily., Disp: , Rfl:  .  potassium chloride (KLOR-CON) 10 MEQ tablet, Take 1 tablet (10 mEq total) by mouth daily. (Patient not taking: Reported on 04/05/2020), Disp: 90 tablet,  Rfl: 3 .  SUPER B COMPLEX/C PO, Take by mouth., Disp: , Rfl:  .  triamcinolone cream (KENALOG) 0.1 %, Apply 1 application topically 2 (two) times daily., Disp: , Rfl:  .  Turmeric 500 MG CAPS, Take by mouth., Disp: , Rfl:  .  UNABLE TO FIND, Take 2 tablets by mouth every morning. Med Name: Moringa Oleifera , Disp: , Rfl:  .  vitamin C (ASCORBIC ACID) 500 MG tablet, Take 500 mg by mouth daily., Disp: , Rfl:  .  Vitamins-Lipotropics (MEGA-CHOL PO), Take 4 capsules by mouth 2 (two) times daily with a meal. Takes Doterra EO mega fish oil, Disp: , Rfl:  .  Zinc 15 MG CAPS, Take 15 mg by mouth daily., Disp: , Rfl:   Current Facility-Administered Medications:  .  nortriptyline (PAMELOR) capsule 10 mg, 10 mg, Oral, QHS, [name], MD  Past Medical History: Past Medical History:  Diagnosis Date  . Hyperlipemia   . Hypertension     Tobacco Use: Social History   Tobacco Use  Smoking Status Former Smoker  . Packs/day: 0.10  . Years: 0.25  . Pack years: 0.02  . Start date: 80  . Quit date: 62  . Years since quitting: 45.7  Smokeless Tobacco Never Used  Tobacco Comment   a couple months when 33 then stopped    Labs: Recent Review Scientist, physiological  Labs for ITP Cardiac and Pulmonary Rehab Latest Ref Rng & Units 06/03/2019 08/17/2019   Cholestrol 100 - 199 mg/dL 219(H) 217(H)   LDLCALC 0 - 99 mg/dL 120(H) 130(H)   HDL >39 mg/dL 53 55   Trlycerides 0 - 149 mg/dL 261(H) 183(H)      Capillary Blood Glucose: No results found for: GLUCAP   Pulmonary Assessment Scores:  Pulmonary Assessment Scores    Row Name 04/05/20 1450         ADL UCSD   ADL Phase Entry     SOB Score total 70     Rest 1     Walk 3     Stairs 4     Bath 3     Dress 3     Shop 5       CAT Score   CAT Score 15       mMRC Score   mMRC Score 3           UCSD: Self-administered rating of dyspnea associated with activities of daily living (ADLs) 6-point scale (0 = "not at all" to 5 =  "maximal or unable to do because of breathlessness")  Scoring Scores range from 0 to 120.  Minimally important difference is 5 units  CAT: CAT can identify the health impairment of COPD patients and is better correlated with disease progression.  CAT has a scoring range of zero to 40. The CAT score is classified into four groups of low (less than 10), medium (10 - 20), high (21-30) and very high (31-40) based on the impact level of disease on health status. A CAT score over 10 suggests significant symptoms.  A worsening CAT score could be explained by an exacerbation, poor medication adherence, poor inhaler technique, or progression of COPD or comorbid conditions.  CAT MCID is 2 points  mMRC: mMRC (Modified Medical Research Council) Dyspnea Scale is used to assess the degree of baseline functional disability in patients of respiratory disease due to dyspnea. No minimal important difference is established. A decrease in score of 1 point or greater is considered a positive change.   Pulmonary Function Assessment:   Exercise Target Goals: Exercise Program Goal: Individual exercise prescription set using results from initial 6 min walk test and THRR while considering  patient's activity barriers and safety.   Exercise Prescription Goal: Initial exercise prescription builds to 30-45 minutes a day of aerobic activity, 2-3 days per week.  Home exercise guidelines will be given to patient during program as part of exercise prescription that the participant will acknowledge.  Activity Barriers & Risk Stratification:   6 Minute Walk:  6 Minute Walk    Row Name 04/05/20 1450         6 Minute Walk   Phase Initial     Distance 1000 feet     Walk Time 6 minutes     # of Rest Breaks 0     MPH 1.9     METS 1.68     RPE 5.89     Perceived Dyspnea  13     VO2 Peak 9     Symptoms No     Resting HR 62 bpm     Resting BP 126/88     Resting Oxygen Saturation  97 %     Exercise Oxygen  Saturation  during 6 min walk 94 %     Max Ex. HR 107 bpm     Max Ex. BP 136/70  2 Minute Post BP 124/70       Interval HR   1 Minute HR 95     2 Minute HR 107     3 Minute HR 102     4 Minute HR 95     5 Minute HR 96     6 Minute HR 96     2 Minute Post HR 61     Interval Heart Rate? Yes       Interval Oxygen   Interval Oxygen? Yes     Baseline Oxygen Saturation % 97 %     1 Minute Oxygen Saturation % 95 %     1 Minute Liters of Oxygen 0 L     2 Minute Oxygen Saturation % 95 %     2 Minute Liters of Oxygen 0 L     3 Minute Oxygen Saturation % 94 %     3 Minute Liters of Oxygen 0 L     4 Minute Oxygen Saturation % 99 %     4 Minute Liters of Oxygen 0 L     5 Minute Oxygen Saturation % 100 %     5 Minute Liters of Oxygen 0 L     6 Minute Oxygen Saturation % 100 %     6 Minute Liters of Oxygen 0 L     2 Minute Post Oxygen Saturation % 100 %     2 Minute Post Liters of Oxygen 0 L            Oxygen Initial Assessment:  Oxygen Initial Assessment - 04/05/20 1449      Home Oxygen   Home Oxygen Device None    Sleep Oxygen Prescription CPAP    Home Exercise Oxygen Prescription None    Home Resting Oxygen Prescription None    Compliance with Home Oxygen Use Yes      Initial 6 min Walk   Oxygen Used None      Program Oxygen Prescription   Program Oxygen Prescription None      Intervention   Short Term Goals To learn and exhibit compliance with exercise, home and travel O2 prescription;To learn and understand importance of monitoring SPO2 with pulse oximeter and demonstrate accurate use of the pulse oximeter.;To learn and understand importance of maintaining oxygen saturations>88%;To learn and demonstrate proper pursed lip breathing techniques or other breathing techniques.;To learn and demonstrate proper use of respiratory medications    Long  Term Goals Exhibits compliance with exercise, home and travel O2 prescription;Verbalizes importance of monitoring SPO2 with  pulse oximeter and return demonstration;Maintenance of O2 saturations>88%;Demonstrates proper use of MDI's;Compliance with respiratory medication;Exhibits proper breathing techniques, such as pursed lip breathing or other method taught during program session           Oxygen Re-Evaluation:  Oxygen Re-Evaluation    Row Name 04/18/20 0903             Program Oxygen Prescription   Program Oxygen Prescription None         Home Oxygen   Home Oxygen Device None       Sleep Oxygen Prescription CPAP       Home Exercise Oxygen Prescription None       Home Resting Oxygen Prescription None       Compliance with Home Oxygen Use Yes         Goals/Expected Outcomes   Short Term Goals To learn and exhibit compliance with exercise, home and travel O2 prescription;To  learn and understand importance of monitoring SPO2 with pulse oximeter and demonstrate accurate use of the pulse oximeter.;To learn and understand importance of maintaining oxygen saturations>88%;To learn and demonstrate proper pursed lip breathing techniques or other breathing techniques.;To learn and demonstrate proper use of respiratory medications       Long  Term Goals Exhibits compliance with exercise, home and travel O2 prescription;Verbalizes importance of monitoring SPO2 with pulse oximeter and return demonstration;Maintenance of O2 saturations>88%;Demonstrates proper use of MDI's;Compliance with respiratory medication;Exhibits proper breathing techniques, such as pursed lip breathing or other method taught during program session       Goals/Expected Outcomes compliance              Oxygen Discharge (Final Oxygen Re-Evaluation):  Oxygen Re-Evaluation - 04/18/20 0903      Program Oxygen Prescription   Program Oxygen Prescription None      Home Oxygen   Home Oxygen Device None    Sleep Oxygen Prescription CPAP    Home Exercise Oxygen Prescription None    Home Resting Oxygen Prescription None    Compliance with Home  Oxygen Use Yes      Goals/Expected Outcomes   Short Term Goals To learn and exhibit compliance with exercise, home and travel O2 prescription;To learn and understand importance of monitoring SPO2 with pulse oximeter and demonstrate accurate use of the pulse oximeter.;To learn and understand importance of maintaining oxygen saturations>88%;To learn and demonstrate proper pursed lip breathing techniques or other breathing techniques.;To learn and demonstrate proper use of respiratory medications    Long  Term Goals Exhibits compliance with exercise, home and travel O2 prescription;Verbalizes importance of monitoring SPO2 with pulse oximeter and return demonstration;Maintenance of O2 saturations>88%;Demonstrates proper use of MDI's;Compliance with respiratory medication;Exhibits proper breathing techniques, such as pursed lip breathing or other method taught during program session    Goals/Expected Outcomes compliance           Initial Exercise Prescription:  Initial Exercise Prescription - 04/05/20 1400      Date of Initial Exercise RX and Referring Provider   Date 04/05/20    Referring Provider Dr. Harrington Challenger    Expected Discharge Date 08/09/20      Treadmill   MPH 1.4    Grade 0    Minutes 17      NuStep   Level 1    SPM 80    Minutes 22      Prescription Details   Frequency (times per week) 2    Duration Progress to 30 minutes of continuous aerobic without signs/symptoms of physical distress      Intensity   Ratings of Perceived Exertion 11-13    Perceived Dyspnea 0-4      Resistance Training   Training Prescription Yes    Weight 2 lbs    Reps 10-15           Perform Capillary Blood Glucose checks as needed.  Exercise Prescription Changes:   Exercise Comments:   Exercise Comments    Row Name 04/12/20 1236           Exercise Comments Pt completed her first exercise session in pulmonary rehab. She was able to tolerate the treadmill and the stepper well with no  complaints.              Exercise Goals and Review:   Exercise Goals    Row Name 04/05/20 1500 04/18/20 0904           Exercise Goals   Increase  Physical Activity Yes Yes      Intervention Provide advice, education, support and counseling about physical activity/exercise needs.;Develop an individualized exercise prescription for aerobic and resistive training based on initial evaluation findings, risk stratification, comorbidities and participant's personal goals. Provide advice, education, support and counseling about physical activity/exercise needs.;Develop an individualized exercise prescription for aerobic and resistive training based on initial evaluation findings, risk stratification, comorbidities and participant's personal goals.      Expected Outcomes Short Term: Attend rehab on a regular basis to increase amount of physical activity.;Long Term: Add in home exercise to make exercise part of routine and to increase amount of physical activity.;Long Term: Exercising regularly at least 3-5 days a week. Short Term: Attend rehab on a regular basis to increase amount of physical activity.;Long Term: Add in home exercise to make exercise part of routine and to increase amount of physical activity.;Long Term: Exercising regularly at least 3-5 days a week.      Increase Strength and Stamina Yes Yes      Intervention Provide advice, education, support and counseling about physical activity/exercise needs.;Develop an individualized exercise prescription for aerobic and resistive training based on initial evaluation findings, risk stratification, comorbidities and participant's personal goals. Provide advice, education, support and counseling about physical activity/exercise needs.;Develop an individualized exercise prescription for aerobic and resistive training based on initial evaluation findings, risk stratification, comorbidities and participant's personal goals.      Expected Outcomes Short  Term: Increase workloads from initial exercise prescription for resistance, speed, and METs.;Short Term: Perform resistance training exercises routinely during rehab and add in resistance training at home;Long Term: Improve cardiorespiratory fitness, muscular endurance and strength as measured by increased METs and functional capacity (6MWT) Short Term: Increase workloads from initial exercise prescription for resistance, speed, and METs.;Short Term: Perform resistance training exercises routinely during rehab and add in resistance training at home;Long Term: Improve cardiorespiratory fitness, muscular endurance and strength as measured by increased METs and functional capacity (6MWT)      Able to understand and use rate of perceived exertion (RPE) scale Yes Yes      Intervention Provide education and explanation on how to use RPE scale Provide education and explanation on how to use RPE scale      Expected Outcomes Short Term: Able to use RPE daily in rehab to express subjective intensity level;Long Term:  Able to use RPE to guide intensity level when exercising independently Short Term: Able to use RPE daily in rehab to express subjective intensity level;Long Term:  Able to use RPE to guide intensity level when exercising independently      Able to understand and use Dyspnea scale Yes Yes      Intervention Provide education and explanation on how to use Dyspnea scale Provide education and explanation on how to use Dyspnea scale      Expected Outcomes Short Term: Able to use Dyspnea scale daily in rehab to express subjective sense of shortness of breath during exertion;Long Term: Able to use Dyspnea scale to guide intensity level when exercising independently Short Term: Able to use Dyspnea scale daily in rehab to express subjective sense of shortness of breath during exertion;Long Term: Able to use Dyspnea scale to guide intensity level when exercising independently      Knowledge and understanding of  Target Heart Rate Range (THRR) Yes Yes      Intervention Provide education and explanation of THRR including how the numbers were predicted and where they are located for reference Provide education  and explanation of THRR including how the numbers were predicted and where they are located for reference      Expected Outcomes Short Term: Able to state/look up THRR;Long Term: Able to use THRR to govern intensity when exercising independently;Short Term: Able to use daily as guideline for intensity in rehab Short Term: Able to state/look up THRR;Long Term: Able to use THRR to govern intensity when exercising independently;Short Term: Able to use daily as guideline for intensity in rehab      Understanding of Exercise Prescription Yes Yes      Intervention Provide education, explanation, and written materials on patient's individual exercise prescription Provide education, explanation, and written materials on patient's individual exercise prescription      Expected Outcomes Short Term: Able to explain program exercise prescription;Long Term: Able to explain home exercise prescription to exercise independently Short Term: Able to explain program exercise prescription;Long Term: Able to explain home exercise prescription to exercise independently             Exercise Goals Re-Evaluation :  Exercise Goals Re-Evaluation    Row Name 04/18/20 0904             Exercise Goal Re-Evaluation   Exercise Goals Review Increase Physical Activity;Able to understand and use rate of perceived exertion (RPE) scale;Increase Strength and Stamina;Able to understand and use Dyspnea scale;Knowledge and understanding of Target Heart Rate Range (THRR);Understanding of Exercise Prescription       Comments Pt has attended 2 exercise sessions. She has a positive attitude and will likely progress quickly. She currently exercises at 2.1 METs on the stepper. Will continue to monitor and progress as able.       Expected Outcomes  Through exercise at rehab and by engaging in a home exercise program, the pt will reach their goals.              Discharge Exercise Prescription (Final Exercise Prescription Changes):   Nutrition:  Target Goals: Understanding of nutrition guidelines, daily intake of sodium 1500mg , cholesterol 200mg , calories 30% from fat and 7% or less from saturated fats, daily to have 5 or more servings of fruits and vegetables.  Biometrics:  Pre Biometrics - 04/05/20 1500      Pre Biometrics   Height 5\' 8"  (1.727 m)    Weight 142.1 kg    Waist Circumference 52 inches    Hip Circumference 63 inches    Waist to Hip Ratio 0.83 %    BMI (Calculated) 47.64    Triceps Skinfold 47 mm    % Body Fat 57.3 %    Grip Strength 34.6 kg    Flexibility 0 in    Single Leg Stand 19.23 seconds            Nutrition Therapy Plan and Nutrition Goals:  Nutrition Therapy & Goals - 04/05/20 1504      Personal Nutrition Goals   Comments Patient scored 36 on her medficts diet assessment scoring highest in eggs. I reviewed her score and provided a handout with discussion about how to make healthier choices. She says she is following a low carb diet. Her LDL is elevated. She says she knows she needs to lose weight; however, that is not one of her primary goals for the program.  Will continue to monitor.      Intervention Plan   Intervention Nutrition handout(s) given to patient.           Nutrition Assessments:  Nutrition Assessments - 04/05/20 1503  MEDFICTS Scores   Pre Score 36           Nutrition Goals Re-Evaluation:   Nutrition Goals Discharge (Final Nutrition Goals Re-Evaluation):   Psychosocial: Target Goals: Acknowledge presence or absence of significant depression and/or stress, maximize coping skills, provide positive support system. Participant is able to verbalize types and ability to use techniques and skills needed for reducing stress and depression.  Initial Review &  Psychosocial Screening:  Initial Psych Review & Screening - 04/05/20 1511      Initial Review   Current issues with None Identified      Family Dynamics   Good Support System? Yes    Comments Patient lives with her husband. She has 4 children and 9 grandchildren. She says she is very involved in their lives and has a great relationship with them. 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 She denies any depression or anxiety. She says she has always had low self-esteem and continues to struggle with this. Will continue to monitor.      Barriers   Psychosocial barriers to participate in program Psychosocial barriers identified (see note)      Screening Interventions   Interventions Encouraged to exercise;Provide feedback about the scores to participant           Quality of Life Scores:  Quality of Life - 04/05/20 1514      Quality of Life   Select Quality of Life      Quality of Life Scores   Health/Function Pre 19.5 %    Socioeconomic Pre 23.71 %    Psych/Spiritual Pre 20.64 %    Family Pre 24 %    GLOBAL Pre 21.21 %          Scores of 19 and below usually indicate a poorer quality of life in these areas.  A difference of  2-3 points is a clinically meaningful difference.  A difference of 2-3 points in the total score of the Quality of Life Index has been associated with significant improvement in overall quality of life, self-image, physical symptoms, and general health in studies assessing change in quality of life.   PHQ-9: Recent Review Flowsheet Data    Depression screen [facility] 2/9 04/05/2020   Decreased Interest 0   Down, Depressed, Hopeless 0   PHQ - 2 Score 0   Altered sleeping 3   Tired, decreased energy 3   Change in appetite 2   Feeling bad or failure about yourself  2   Trouble concentrating 0   Moving slowly or fidgety/restless 0   Suicidal thoughts 0   PHQ-9 Score 10   Difficult doing work/chores  Not difficult at all     Interpretation of Total Score  Total Score Depression Severity:  1-4 = Minimal depression, 5-9 = Mild depression, 10-14 = Moderate depression, 15-19 = Moderately severe depression, 20-27 = Severe depression   Psychosocial Evaluation and Intervention:  Psychosocial Evaluation - 04/05/20 1515      Psychosocial Evaluation & Interventions   Interventions Stress management education;Relaxation education;Encouraged to exercise with the program and follow exercise prescription    Comments Patient's initial QOL score was 21.21 and her PHQ-9 score was 10 with no psychosocial issues identified at her orientation visit. She says she has struggled with a low self-esteem her whole life but has a positive outlook. Will continue to monitor.    Expected Outcomes Patient will have no psychosocial issues  identified at discharge.    Continue Psychosocial Services  No Follow up required           Psychosocial Re-Evaluation:  Psychosocial Re-Evaluation    [place] Name 04/19/20 [phone]             Psychosocial Re-Evaluation   Current issues with None Identified       Comments Patient continues to have no psychosocial issues identified. Will continue to monitor.       Expected Outcomes Patient will have no psychosocial issues identifed at discharge.       Interventions Relaxation education;Stress management education;Encouraged to attend Pulmonary Rehabilitation for the exercise       Continue Psychosocial Services  No Follow up required              Psychosocial Discharge (Final Psychosocial Re-Evaluation):  Psychosocial Re-Evaluation - 04/19/20 0753      Psychosocial Re-Evaluation   Current issues with None Identified    Comments Patient continues to have no psychosocial issues identified. Will continue to monitor.    Expected Outcomes Patient will have no psychosocial issues identifed at discharge.    Interventions Relaxation education;Stress management  education;Encouraged to attend Pulmonary Rehabilitation for the exercise    Continue Psychosocial Services  No Follow up required            Education: Education Goals: Education classes will be provided on a weekly basis, covering required topics. Participant will state understanding/return demonstration of topics presented.  Learning Barriers/Preferences:  Learning Barriers/Preferences - 04/05/20 1503      Learning Barriers/Preferences   Learning Barriers None    Learning Preferences Skilled Demonstration           Education Topics: How Lungs Work and Diseases: - Discuss the anatomy of the lungs and diseases that can affect the lungs, such as COPD.   Exercise: -Discuss the importance of exercise, FITT principles of exercise, normal and abnormal responses to exercise, and how to exercise safely.   Environmental Irritants: -Discuss types of environmental irritants and how to limit exposure to environmental irritants.   Meds/Inhalers and oxygen: - Discuss respiratory medications, definition of an inhaler and oxygen, and the proper way to use an inhaler and oxygen.   Energy Saving Techniques: - Discuss methods to conserve energy and decrease shortness of breath when performing activities of daily living.    Bronchial Hygiene / Breathing Techniques: - Discuss breathing mechanics, pursed-lip breathing technique,  proper posture, effective ways to clear airways, and other functional breathing techniques   Cleaning Equipment: - Provides group verbal and written instruction about the health risks of elevated stress, cause of high stress, and healthy ways to reduce stress.   Nutrition I: Fats: - Discuss the types of cholesterol, what cholesterol does to the body, and how cholesterol levels can be controlled.   Nutrition II: Labels: -Discuss the different components of food labels and how to read food labels.   Respiratory Infections: - Discuss the signs and  symptoms of respiratory infections, ways to prevent respiratory infections, and the importance of seeking medical treatment when having a respiratory infection.   Stress I: Signs and Symptoms: - Discuss the causes of stress, how stress may lead to anxiety and depression, and ways to limit stress.   Stress II: Relaxation: -Discuss relaxation techniques to limit stress.   Oxygen for Home/Travel: - Discuss how to prepare for travel when on oxygen and proper ways to transport and store oxygen to ensure safety.   Knowledge Questionnaire Score:  Knowledge Questionnaire Score - 04/05/20 1503      Knowledge Questionnaire Score   Pre Score 15/18           Core Components/Risk Factors/Patient Goals at Admission:  Personal Goals and Risk Factors at Admission - 04/05/20 1510      Core Components/Risk Factors/Patient Goals on Admission    Weight Management Obesity    Personal Goal Other Yes    Personal Goal Be able to do her ADL's without getting SOB.    Intervention Patient will attend PR 2 days/week and supplement with exercise at home 3 days/week.    Expected Outcomes Patient will complete the program meeting her personal and program goals.           Core Components/Risk Factors/Patient Goals Review:   Goals and Risk Factor Review    Row Name 04/19/20 0748             Core Components/Risk Factors/Patient Goals Review   Personal Goals Review Weight Management/Obesity;Other       Review Patient is new to the program.  She has completed 3 sessions without difficulty. Will continue to montior as she works toward meeting her personal and program goals.       Expected Outcomes Patient will complete the program meeting her personal and program goals.              Core Components/Risk Factors/Patient Goals at Discharge (Final Review):   Goals and Risk Factor Review - 04/19/20 0748      Core Components/Risk Factors/Patient Goals Review   Personal Goals Review Weight  Management/Obesity;Other    Review Patient is new to the program.  She has completed 3 sessions without difficulty. Will continue to montior as she works toward meeting her personal and program goals.    Expected Outcomes Patient will complete the program meeting her personal and program goals.           ITP Comments:   Comments: ITP REVIEW Pt is making expected progress toward pulmonary rehab goals after completing 3 sessions. Recommend continued exercise, life style modification, education, and utilization of breathing techniques to increase stamina and strength and decrease shortness of breath with exertion.

## 2020-04-19 NOTE — Progress Notes (Signed)
Daily Session Note ° °Patient Details  °Name: Valene D Kempker °MRN: [id number] °Date of Birth: 05/02/1956 °Referring Provider:   °  PULMONARY REHAB OTHER RESP ORIENTATION from 04/05/2020 in [place] [facility]  °Referring Provider Dr. Ross  °  ° ° °Encounter Date: 04/19/2020 ° °Check In: ° Session Check In - 04/19/20 1058   °  ° Check-In  ° Supervising physician immediately available to respond to emergencies See telemetry face sheet for immediately available MD   ° Location [facility]   ° Staff Present Annedrea Stackhouse, RN, MHA;Debra Johnson, RN, BSN   ° Virtual Visit No   ° Medication changes reported     No   ° Fall or balance concerns reported    No   ° Tobacco Cessation No Change   ° Warm-up and Cool-down Performed as group-led instruction   ° Resistance Training Performed Yes   ° VAD Patient? No   ° PAD/SET Patient? No   °  ° Pain Assessment  ° Currently in Pain? No/denies   ° Multiple Pain Sites No   °  °  °  ° ° °Capillary Blood Glucose: °No results found for this or any previous visit (from the past 24 hour(s)). ° ° ° °Social History  ° °Tobacco Use  °Smoking Status Former Smoker  °• Packs/day: 0.10  °• Years: 0.25  °• Pack years: 0.02  °• Start date: 1976  °• Quit date: 1976  °• Years since quitting: 45.7  °Smokeless Tobacco Never Used  °Tobacco Comment  ° a couple months when 19 then stopped  ° ° °Goals Met:  °Proper associated with RPD/PD & O2 Sat °Independence with exercise equipment °Improved SOB with ADL's °Exercise tolerated well °No report of cardiac concerns or symptoms °Strength training completed today ° °Goals Unmet:  °Not Applicable ° °Comments: 1045-1145 ° ° °Dr. Jehanzeb Memon is Medical Director for [facility]. °

## 2020-04-24 ENCOUNTER — Ambulatory Visit (INDEPENDENT_AMBULATORY_CARE_PROVIDER_SITE_OTHER): Payer: 59 | Admitting: Internal Medicine

## 2020-04-24 ENCOUNTER — Encounter: Payer: Self-pay | Admitting: Internal Medicine

## 2020-04-24 ENCOUNTER — Other Ambulatory Visit: Payer: Self-pay

## 2020-04-24 ENCOUNTER — Encounter (HOSPITAL_COMMUNITY)
Admission: RE | Admit: 2020-04-24 | Discharge: 2020-04-24 | Disposition: A | Discharge status: Home / Self Care | Payer: 59 | Source: Ambulatory Visit | Attending: Internal Medicine | Admitting: Internal Medicine

## 2020-04-24 VITALS — Wt 312.8 lb

## 2020-04-24 DIAGNOSIS — R0609 Other forms of dyspnea: Secondary | ICD-10-CM

## 2020-04-24 DIAGNOSIS — I10 Essential (primary) hypertension: Secondary | ICD-10-CM | POA: Diagnosis not present

## 2020-04-24 DIAGNOSIS — R0600 Dyspnea, unspecified: Secondary | ICD-10-CM

## 2020-04-24 DIAGNOSIS — R0602 Shortness of breath: Secondary | ICD-10-CM | POA: Diagnosis present

## 2020-04-24 MED ORDER — OLMESARTAN MEDOXOMIL-HCTZ 20-12.5 MG PO TABS: 1.0000 | ORAL_TABLET | Freq: Every day | ORAL | 11 refills | Status: DC

## 2020-04-24 NOTE — Assessment & Plan Note (Addendum)
Try off acei 04/24/2020 due to unexplained sob s prominent cough   In the best review of chronic cough to date ( NEJM 2016 375 [id number]) ,  ACEi are now felt to cause cough in up to  20% of pts which is a 4 fold increase from previous reports and does not include the variety of non-specific complaints we see in pulmonary clinic in pts on ACEi but previously attributed to another dx like  Copd/asthma and  include PNDS, throat and chest congestion, "bronchitis", unexplained dyspnea and noct "strangling" sensations, and hoarseness, but also  atypical /refractory GERD symptoms like dysphagia and "bad heartburn"   The only way I know  to prove this is not an "ACEi Case" is a trial off ACEi x a minimum of 6 weeks then regroup.   >>> try benicar 20-12.5 and recheck in 6 weeks          Each maintenance medication was reviewed in detail including emphasizing most importantly the difference between maintenance and prns and under what circumstances the prns are to be triggered using an action plan format where appropriate.  Total time for H and P, chart review, counseling,  directly observing portions of ambulatory 02 saturation study/  and generating customized AVS unique to this office visit with pt new to me / charting  > 40 min

## 2020-04-24 NOTE — Progress Notes (Signed)
Daily Session Note  Patient Details  Name: Meagan Reyes MRN: [id number] Date of Birth: August 20, 1955  Referring Provider:     PULMONARY REHAB OTHER RESP ORIENTATION from 04/05/2020 in [place]  Referring Provider Dr. Harrington Challenger      Encounter Date: 04/24/2020  Check In:  Session Check In - 04/24/20 1100      Check-In   Supervising physician immediately available to respond to emergencies See telemetry face sheet for immediately available MD    Location [facility]    Staff Present Geanie Cooley, Felipe Drone, RN, [facility]    Virtual Visit No    Medication changes reported     No    Fall or balance concerns reported    No    Tobacco Cessation No Change    Warm-up and Cool-down Performed as group-led instruction    Resistance Training Performed Yes    VAD Patient? No    PAD/SET Patient? No      Pain Assessment   Currently in Pain? No/denies    Pain Score 0-No pain    Multiple Pain Sites No           Capillary Blood Glucose: No results found for this or any previous visit (from the past 24 hour(s)).    Social History   Tobacco Use  Smoking Status Former Smoker  . Packs/day: 0.10  . Years: 0.25  . Pack years: 0.02  . Start date: 72  . Quit date: 41  . Years since quitting: 45.7  Smokeless Tobacco Never Used  Tobacco Comment   a couple months when 19 then stopped    Goals Met:  Proper associated with RPD/PD & O2 Sat Independence with exercise equipment Using PLB without cueing & demonstrates good technique Exercise tolerated well No report of cardiac concerns or symptoms Strength training completed today  Goals Unmet:  Not Applicable  Comments: check out '@12'    Dr. Kathie Dike is Medical Director for [facility].

## 2020-04-24 NOTE — Patient Instructions (Signed)
Stop lisonopril and start olmesartan 20-12.5 one daily in its place.  To get the most out of exercise, you need to be continuously aware that you are short of breath, but never out of breath, for ideally up to 30 minutes daily. As you improve, it will actually be easier for you to do the same amount of exercise  in  30 minutes so always push to the level where you are short of breath.     Pt informed of the seriousness of COVID 19 infection as a direct risk to lung health  and safey and to close contacts and should continue to wear a facemask in public and minimize exposure to public locations but especially avoid any area or activity where non-close contacts are not observing distancing or wearing an appropriate face mask.  I strongly recommended she take either of the vaccines available through local drugstores based on updated information on millions of Americans treated with the [place] products  which have proven both safe and  effective even against the new delta variant.    Please schedule a follow up office visit in 6 weeks, call sooner if needed.

## 2020-04-24 NOTE — Progress Notes (Signed)
Meagan Reyes, female    DOB: Dec 17, 1955,    MRN: [id number]   Brief patient profile:  11 yowf minimal smoking hx with h/o sinus infections/ tonsils infections as child s/p RT in [place]  And by age 64 or 41 a did fine therafter in [place] since age 74, fine with IUP last in 1989 with baseline wt  low 200's not limited very physically active with only issue was HBP's started on hctz then lisinopril not sure when that was added but  then Sept 1 2020 > p doxy cp, sob/sweaty > [facility] ruled out MI > everything resolved  except sob present since despite neg w/u By Meagan Reyes (pulmonary) and Meagan Reyes (cards) self referred to pulmonary clinic in [place].   Note pfts 05/10/20 nl except for very low ERV     History of Present Illness  04/24/2020  Pulmonary/ 1st office eval/Meagan Reyes  Chief Complaint  Patient presents with  . Follow-up    Former Meagan Reyes pt.  Recently started rehab and feels that it has helped minimally. She states breathing feels restricted due to wearing a mask.   Dyspnea:  Going up ramp / no regular walking   Cough: no Sleep: <30 degrees with electric bed one pillow SABA use: ? Helped a little   No obvious day to day or daytime variability or assoc excess/ purulent sputum or mucus plugs or hemoptysis or cp or chest tightness, subjective wheeze or overt sinus or hb symptoms.   Sleeping  without nocturnal  or early am exacerbation  of respiratory  c/o's or need for noct saba. Also denies any obvious fluctuation of symptoms with weather or environmental changes or other aggravating or alleviating factors except as outlined above   No unusual exposure hx or h/o childhood pna/ asthma or knowledge of premature birth.  Current Allergies, Complete Past Medical History, Past Surgical History, Family History, and Social History were reviewed in [employer] record.  ROS  The following are not active complaints unless bolded Hoarseness, sore throat, dysphagia, dental problems,  itching, sneezing,  nasal congestion or discharge of excess mucus or purulent secretions, ear ache,   fever, chills, sweats, unintended wt loss or wt gain, classically pleuritic or exertional cp,  orthopnea pnd or arm/hand swelling  or leg swelling, presyncope, palpitations, abdominal pain, anorexia, nausea, vomiting, diarrhea  or change in bowel habits or change in bladder habits, change in stools or change in urine, dysuria, hematuria,  rash, arthralgias, visual complaints, headache, numbness, weakness or ataxia or problems with walking or coordination,  change in mood or  memory.           Past Medical History:  Diagnosis Date  . Hyperlipemia   . Hypertension     Outpatient Medications Prior to Visit  Medication Sig Dispense Refill  . aspirin 81 MG chewable tablet Chew 81 mg by mouth at bedtime.     Marland Kitchen CALCIUM PO Take 1 tablet by mouth daily.    . cetirizine (ZYRTEC) 10 MG tablet Take 1 tablet (10 mg total) by mouth daily. (Patient not taking: Reported on 04/05/2020) 60 tablet 5  . Cholecalciferol (VITAMIN D3) 25 MCG (1000 UT) CAPS Take 2,000 Units by mouth 2 (two) times daily.    . famotidine (PEPCID) 20 MG tablet Take 20 mg by mouth 2 (two) times daily. (Patient not taking: Reported on 04/05/2020)    . furosemide (LASIX) 40 MG tablet Take 40 mg by mouth daily.   0  . hydrOXYzine  HCl (ATARAX PO) Take by mouth as needed. (Patient not taking: Reported on 04/05/2020)    . lisinopril-hydrochlorothiazide (ZESTORETIC) 20-25 MG tablet Take 1 tablet by mouth daily.    Marland Kitchen MAGNESIUM PO Take 1 tablet by mouth daily.    . meloxicam (MOBIC) 15 MG tablet Take 1 tablet by mouth once daily 90 tablet 0  . Omega-3 Fatty Acids (FISH OIL) 1000 MG CAPS Take 2,000 capsules by mouth daily.  (Patient not taking: Reported on 04/05/2020)    . pantoprazole (PROTONIX) 40 MG tablet Take 40 mg by mouth daily.    . potassium chloride (KLOR-CON) 10 MEQ tablet Take 1 tablet (10 mEq total) by mouth daily. (Patient not taking:  Reported on 04/05/2020) 90 tablet 3  . SUPER B COMPLEX/C PO Take by mouth.    . triamcinolone cream (KENALOG) 0.1 % Apply 1 application topically 2 (two) times daily.    . Turmeric 500 MG CAPS Take by mouth.    Marland Kitchen UNABLE TO FIND Take 2 tablets by mouth every morning. Med Name: [name]     . vitamin C (ASCORBIC ACID) 500 MG tablet Take 500 mg by mouth daily.    . Vitamins-Lipotropics (MEGA-CHOL PO) Take 4 capsules by mouth 2 (two) times daily with a meal. Takes Doterra EO mega fish oil    . Zinc 15 MG CAPS Take 15 mg by mouth daily.        Objective:     BP 114/66 (BP Location: Left Arm, Cuff Size: Large)   Pulse 63   Temp 97.8 F (36.6 C) (Temporal)   Ht 5\' 8"  (1.727 m)   Wt (!) 314 lb (142.4 kg)   SpO2 98% Comment: on RA  BMI 47.74 kg/m   SpO2: 98 % (on RA)   Obese amb wf nad    HEENT : pt wearing mask not removed for exam due to covid -19 concerns.    NECK :  without JVD/Nodes/TM/ nl carotid upstrokes bilaterally   LUNGS: no acc muscle use,  Nl contour chest which is clear to A and P bilaterally without cough on insp or exp maneuvers   CV:  RRR  no s3 or murmur or increase in P2, and trace to 1+ pitting edema both LE's  ABD:  Obese soft and nontender with nl inspiratory excursion in the supine position. No bruits or organomegaly appreciated, bowel sounds nl  MS:  Nl gait/ ext warm without deformities, calf tenderness, cyanosis or clubbing No obvious joint restrictions   SKIN: warm and dry without lesions    NEURO:  alert, approp, nl sensorium with  no motor or cerebellar deficits apparent.       I personally reviewed images and agree with radiology impression as follows:  CXR:   08/23/19  No acute abnormalities.      Assessment   No problem-specific Assessment & Plan notes found for this encounter.     Meagan Gully, MD 04/24/2020

## 2020-04-24 NOTE — Assessment & Plan Note (Signed)
Body mass index is 47.74 kg/m.  -   No results found for: TSH    Contributing to low ERV/  doe/reviewed the need and the process to achieve and maintain neg calorie balance > defer f/u primary care including intermittently monitoring thyroid status

## 2020-04-24 NOTE — Assessment & Plan Note (Addendum)
Onset sept 2020 - pfts 05/11/2019  wnl x very low ERV - echo 05/12/19 echo  C/w diastolic dysfunction  - started Cardiopulmonary rehab 04/05/20 -  04/24/2020   Walked RA  approx   300 ft  @ fast pace  stopped due to min sob/ sats 89%    - Trial off ACEi 04/24/2020 >>>  Symptoms are  disproportionate to objective findings and not clear to what extent this is actually a pulmonary  problem but pt does appear to have difficult to sort out respiratory symptoms of unknown origin for which  DDX  = almost all start with A and  include Adherence, Ace Inhibitors, Acid Reflux, Active Sinus Disease, Alpha 1 Antitripsin deficiency, Anxiety masquerading as Airways dz,  ABPA,  Allergy(esp in young), Aspiration (esp in elderly), Adverse effects of meds,  Active smoking or Vaping, A bunch of PE's/clot burden (a few small clots can't cause this syndrome unless there is already severe underlying pulm or vascular dz with poor reserve),  Anemia or thyroid disorder, plus two Bs  = Bronchiectasis and Beta blocker use..and one C= CHF   Adherence is always the initial "prime suspect" and is a multilayered concern that requires a "trust but verify" approach in every patient - starting with knowing how to use medications, especially inhalers, correctly, keeping up with refills and understanding the fundamental difference between maintenance and prns vs those medications only taken for a very short course and then stopped and not refilled.  - return with all meds in hand using a trust but verify approach to confirm accurate Medication  Reconciliation The principal here is that until we are certain that the  patients are doing what we've asked, it makes no sense to ask them to do more.   ? Anxiety/depression/ deconditioning with wt gain noted and very low ERV only pulmonary finding  > usually at the bottom of this list of usual suspects but  may interfere with adherence and also interpretation of response or lack thereof to symptom  management which can be quite subjective.  >>> agree with rehab plus more outdoor walking s mask  ? Allergy/asthma > nothing to suggest at this point   ? chf > note diastolic dysfunction and edema on exam > rx per cards planned    Pt informed of the seriousness of COVID 19 infection as a direct risk to lung health  and safey and to close contacts and should continue to wear a facemask in public and minimize exposure to public locations but especially avoid any area or activity where non-close contacts are not observing distancing or wearing an appropriate face mask.  I strongly recommended she take either of the vaccines available through local drugstores based on updated information on millions of Americans treated with the [place] products  which have proven both safe and  effective even against the new delta variant.

## 2020-04-26 ENCOUNTER — Encounter (HOSPITAL_COMMUNITY): Payer: 59

## 2020-04-26 ENCOUNTER — Ambulatory Visit: Payer: 59 | Admitting: Nurse Practitioner

## 2020-05-01 ENCOUNTER — Encounter (HOSPITAL_COMMUNITY)
Admission: RE | Admit: 2020-05-01 | Discharge: 2020-05-01 | Disposition: A | Discharge status: Home / Self Care | Payer: 59 | Source: Ambulatory Visit | Attending: Internal Medicine | Admitting: Internal Medicine

## 2020-05-01 ENCOUNTER — Other Ambulatory Visit: Payer: Self-pay

## 2020-05-01 DIAGNOSIS — R0602 Shortness of breath: Secondary | ICD-10-CM

## 2020-05-01 NOTE — Progress Notes (Signed)
Daily Session Note  Patient Details  Name: Meagan Reyes MRN: [id number] Date of Birth: Feb 22, 1956 Referring Provider:     PULMONARY REHAB OTHER RESP ORIENTATION from 04/05/2020 in [place]  Referring Provider Dr. Harrington Challenger      Encounter Date: 05/01/2020  Check In:  Session Check In - 05/01/20 1053      Check-In   Supervising physician immediately available to respond to emergencies [facility] MD immediately available    Physician(s) Dr. Domenic Polite    Location [facility]    Staff Present Ramon Dredge, RN, MHA;Dalton Kris Mouton, MS, ACSM-CEP, Exercise Physiologist    Virtual Visit No    Medication changes reported     No    Fall or balance concerns reported    No    Tobacco Cessation No Change    Warm-up and Cool-down Performed as group-led instruction    Resistance Training Performed Yes    VAD Patient? No    PAD/SET Patient? No      Pain Assessment   Currently in Pain? No/denies    Pain Score 0-No pain    Multiple Pain Sites No           Capillary Blood Glucose: No results found for this or any previous visit (from the past 24 hour(s)).    Social History   Tobacco Use  Smoking Status Former Smoker  . Packs/day: 0.10  . Years: 0.25  . Pack years: 0.02  . Start date: 76  . Quit date: 37  . Years since quitting: 45.8  Smokeless Tobacco Never Used  Tobacco Comment   a couple months when 19 then stopped    Goals Met:  Proper associated with RPD/PD & O2 Sat Independence with exercise equipment Improved SOB with ADL's Exercise tolerated well No report of cardiac concerns or symptoms Strength training completed today  Goals Unmet:  Not Applicable  Comments: [id number]   Dr. Kathie Dike is Medical Director for [facility].

## 2020-05-03 ENCOUNTER — Encounter (HOSPITAL_COMMUNITY)
Admission: RE | Admit: 2020-05-03 | Discharge: 2020-05-03 | Disposition: A | Discharge status: Home / Self Care | Payer: 59 | Source: Ambulatory Visit | Attending: Internal Medicine | Admitting: Internal Medicine

## 2020-05-03 ENCOUNTER — Other Ambulatory Visit: Payer: Self-pay

## 2020-05-03 DIAGNOSIS — R0602 Shortness of breath: Secondary | ICD-10-CM | POA: Diagnosis not present

## 2020-05-03 NOTE — Progress Notes (Signed)
Daily Session Note  Patient Details  Name: Meagan Reyes MRN: [id number] Date of Birth: Nov 27, 1955 Referring Provider:     PULMONARY REHAB OTHER RESP ORIENTATION from 04/05/2020 in [place]  Referring Provider Dr. Harrington Challenger      Encounter Date: 05/03/2020  Check In:  Session Check In - 05/03/20 1041      Check-In   Supervising physician immediately available to respond to emergencies [facility] MD immediately available    Physician(s) Dr. Domenic Polite    Location [facility]    Staff Present Hoy Register, MS, ACSM-CEP, Exercise Physiologist;Jynesis Nakamura Wynetta Emery, RN, BSN    Virtual Visit No    Medication changes reported     No    Fall or balance concerns reported    No    Tobacco Cessation No Change    Warm-up and Cool-down Performed as group-led instruction    Resistance Training Performed No    VAD Patient? No    PAD/SET Patient? No      Pain Assessment   Currently in Pain? No/denies    Pain Score 0-No pain    Multiple Pain Sites No           Capillary Blood Glucose: No results found for this or any previous visit (from the past 24 hour(s)).    Social History   Tobacco Use  Smoking Status Former Smoker   Packs/day: 0.10   Years: 0.25   Pack years: 0.02   Start date: 1976   Quit date: 1976   Years since quitting: 45.8  Smokeless Tobacco Never Used  Tobacco Comment   a couple months when 19 then stopped    Goals Met:  Proper associated with RPD/PD & O2 Sat Independence with exercise equipment Improved SOB with ADL's Using PLB without cueing & demonstrates good technique Exercise tolerated well No report of cardiac concerns or symptoms Strength training completed today  Goals Unmet:  Not Applicable  Comments: Check out 1145.   Dr. Kathie Dike is Medical Director for [facility].

## 2020-05-08 ENCOUNTER — Encounter (HOSPITAL_COMMUNITY): Payer: 59

## 2020-05-08 ENCOUNTER — Encounter (HOSPITAL_COMMUNITY)
Admission: RE | Admit: 2020-05-08 | Discharge: 2020-05-08 | Disposition: A | Discharge status: Home / Self Care | Payer: 59 | Source: Ambulatory Visit | Attending: Internal Medicine | Admitting: Internal Medicine

## 2020-05-08 ENCOUNTER — Other Ambulatory Visit: Payer: Self-pay

## 2020-05-08 VITALS — Wt 316.8 lb

## 2020-05-08 DIAGNOSIS — R0602 Shortness of breath: Secondary | ICD-10-CM | POA: Diagnosis not present

## 2020-05-08 NOTE — Progress Notes (Signed)
Daily Session Note  Patient Details  Name: Meagan Reyes MRN: [id number] Date of Birth: [birth year] Referring Provider:     PULMONARY REHAB OTHER RESP ORIENTATION from 04/05/2020 in [place]  Referring Provider Dr. Harrington Challenger      Encounter Date: 05/08/2020  Check In:  Session Check In - 05/08/20 1110      Check-In   Supervising physician immediately available to respond to emergencies [facility] MD immediately available    Physician(s) Dr. Harl Bowie    Location [facility]    Staff Present Hoy Register, MS, ACSM-CEP, Exercise Physiologist    Virtual Visit No    Medication changes reported     No    Fall or balance concerns reported    No    Tobacco Cessation No Change    Warm-up and Cool-down Performed as group-led instruction    Resistance Training Performed Yes    VAD Patient? No    PAD/SET Patient? No      Pain Assessment   Currently in Pain? No/denies    Pain Score 0-No pain    Multiple Pain Sites No           Capillary Blood Glucose: No results found for this or any previous visit (from the past 24 hour(s)).    Social History   Tobacco Use  Smoking Status Former Smoker  . Packs/day: 0.10  . Years: 0.25  . Pack years: 0.02  . Start date: 62  . Quit date: 89  . Years since quitting: 45.8  Smokeless Tobacco Never Used  Tobacco Comment   a couple months when 35 then stopped    Goals Met:  Independence with exercise equipment Exercise tolerated well No report of cardiac concerns or symptoms Strength training completed today  Goals Unmet:  Not Applicable  Comments: checkout time is 1145   Dr. Kathie Dike is Medical Director for [facility].

## 2020-05-10 ENCOUNTER — Encounter (HOSPITAL_COMMUNITY): Payer: 59

## 2020-05-12 ENCOUNTER — Encounter: Payer: Self-pay | Admitting: Gastroenterology

## 2020-05-12 NOTE — Progress Notes (Deleted)
Referring Provider:*** Primary Care Physician:  Redmond School, MD Primary Gastroenterologist:  Dr. Rayne Du chief complaint on file.   HPI:   Meagan Reyes is a 64 y.o. female presenting today at the request of Redmond School, MD for consult colonoscopy.      Currently in cardiopulmonary rehab.  Diastolic heart failure   Past Medical History:  Diagnosis Date  . Hyperlipemia   . Hypertension     Past Surgical History:  Procedure Laterality Date  . ABDOMINAL HYSTERECTOMY    . BACK SURGERY    . HEEL SPUR EXCISION    . KNEE ARTHROSCOPY WITH LATERAL MENISECTOMY Right 10/18/2015   Procedure: RIGHT KNEE ARTHROSCOPY WITH LATERAL MENISECTOMY;  Surgeon: [name], MD;  Location: [facility];  Service: Orthopedics;  Laterality: Right;  . TONSILLECTOMY      Current Outpatient Medications  Medication Sig Dispense Refill  . aspirin 81 MG chewable tablet Chew 81 mg by mouth at bedtime.     Marland Kitchen CALCIUM PO Take 1 tablet by mouth daily.    . cetirizine (ZYRTEC) 10 MG tablet Take 1 tablet (10 mg total) by mouth daily. (Patient not taking: Reported on 04/05/2020) 60 tablet 5  . Cholecalciferol (VITAMIN D3) 25 MCG (1000 UT) CAPS Take 2,000 Units by mouth 2 (two) times daily.    . famotidine (PEPCID) 20 MG tablet Take 20 mg by mouth 2 (two) times daily. (Patient not taking: Reported on 04/05/2020)    . furosemide (LASIX) 40 MG tablet Take 40 mg by mouth daily.   0  . hydrOXYzine HCl (ATARAX PO) Take by mouth as needed. (Patient not taking: Reported on 04/05/2020)    . MAGNESIUM PO Take 1 tablet by mouth daily.    . meloxicam (MOBIC) 15 MG tablet Take 1 tablet by mouth once daily 90 tablet 0  . olmesartan-hydrochlorothiazide (BENICAR HCT) 20-12.5 MG tablet Take 1 tablet by mouth daily. 30 tablet 11  . Omega-3 Fatty Acids (FISH OIL) 1000 MG CAPS Take 2,000 capsules by mouth daily.  (Patient not taking: Reported on 04/05/2020)    . pantoprazole (PROTONIX) 40 MG tablet Take 40 mg by mouth  daily.    . potassium chloride (KLOR-CON) 10 MEQ tablet Take 1 tablet (10 mEq total) by mouth daily. (Patient not taking: Reported on 04/05/2020) 90 tablet 3  . SUPER B COMPLEX/C PO Take by mouth.    . triamcinolone cream (KENALOG) 0.1 % Apply 1 application topically 2 (two) times daily.    . Turmeric 500 MG CAPS Take by mouth.    Marland Kitchen UNABLE TO FIND Take 2 tablets by mouth every morning. Med Name: [name]     . vitamin C (ASCORBIC ACID) 500 MG tablet Take 500 mg by mouth daily.    . Vitamins-Lipotropics (MEGA-CHOL PO) Take 4 capsules by mouth 2 (two) times daily with a meal. Takes Doterra EO mega fish oil    . Zinc 15 MG CAPS Take 15 mg by mouth daily.     Current Facility-Administered Medications  Medication Dose Route Frequency Provider Last Rate Last Admin  . nortriptyline (PAMELOR) capsule 10 mg  10 mg Oral QHS [name], MD        Allergies as of 05/14/2020 - Review Complete 04/24/2020  Allergen Reaction Noted  . Atorvastatin Other (See Comments) 04/04/2019  . Doxycycline  05/03/2019  . Neurontin [gabapentin] Rash 07/12/2013    Family History  Problem Relation Age of Onset  . Stroke Mother   .  Asthma Mother   . Atrial fibrillation Mother   . Leukemia Mother   . COPD Mother   . Cancer Father        lung  . Hyperlipidemia Brother   . Hyperlipidemia Brother   . Hyperlipidemia Brother     Social History   Socioeconomic History  . Marital status: Married    Spouse name: Not on file  . Number of children: Not on file  . Years of education: Not on file  . Highest education level: Not on file  Occupational History  . Not on file  Tobacco Use  . Smoking status: Former Smoker    Packs/day: 0.10    Years: 0.25    Pack years: 0.02    Start date: 1976    Quit date: 1976    Years since quitting: 45.8  . Smokeless tobacco: Never Used  . Tobacco comment: a couple months when 19 then stopped  Vaping Use  . Vaping Use: Never used  Substance and Sexual  Activity  . Alcohol use: No  . Drug use: No  . Sexual activity: Yes    Birth control/protection: Surgical  Other Topics Concern  . Not on file  Social History Narrative  . Not on file   Social Determinants of Health   Financial Resource Strain:   . Difficulty of Paying Living Expenses: Not on file  Food Insecurity:   . Worried About Charity fundraiser in the Last Year: Not on file  . Ran Out of Food in the Last Year: Not on file  Transportation Needs:   . Lack of Transportation (Medical): Not on file  . Lack of Transportation (Non-Medical): Not on file  Physical Activity:   . Days of Exercise per Week: Not on file  . Minutes of Exercise per Session: Not on file  Stress:   . Feeling of Stress : Not on file  Social Connections:   . Frequency of Communication with Friends and Family: Not on file  . Frequency of Social Gatherings with Friends and Family: Not on file  . Attends Religious Services: Not on file  . Active Member of Clubs or Organizations: Not on file  . Attends Archivist Meetings: Not on file  . Marital Status: Not on file  Intimate Partner Violence:   . Fear of Current or Ex-Partner: Not on file  . Emotionally Abused: Not on file  . Physically Abused: Not on file  . Sexually Abused: Not on file    Review of Systems: Gen: Denies any fever, chills, fatigue, weight loss, lack of appetite.  CV: Denies chest pain, heart palpitations, peripheral edema, syncope.  Resp: Denies shortness of breath at rest or with exertion. Denies wheezing or cough.  GI: Denies dysphagia or odynophagia. Denies jaundice, hematemesis, fecal incontinence. GU : Denies urinary burning, urinary frequency, urinary hesitancy MS: Denies joint pain, muscle weakness, cramps, or limitation of movement.  Derm: Denies rash, itching, dry skin Psych: Denies depression, anxiety, memory loss, and confusion Heme: Denies bruising, bleeding, and enlarged lymph nodes.  Physical Exam: There  were no vitals taken for this visit. General:   Alert and oriented. Pleasant and cooperative. Well-nourished and well-developed.  Head:  Normocephalic and atraumatic. Eyes:  Without icterus, sclera clear and conjunctiva pink.  Ears:  Normal auditory acuity. Nose:  No deformity, discharge,  or lesions. Mouth:  No deformity or lesions, oral mucosa pink.  Neck:  Supple, without mass or thyromegaly. Lungs:  Clear to auscultation bilaterally.  No wheezes, rales, or rhonchi. No distress.  Heart:  S1, S2 present without murmurs appreciated.  Abdomen:  +BS, soft, non-tender and non-distended. No HSM noted. No guarding or rebound. No masses appreciated.  Rectal:  Deferred  Msk:  Symmetrical without gross deformities. Normal posture. Pulses:  Normal pulses noted. Extremities:  Without clubbing or edema. Neurologic:  Alert and  oriented x4;  grossly normal neurologically. Skin:  Intact without significant lesions or rashes. Cervical Nodes:  No significant cervical adenopathy. Psych:  Alert and cooperative. Normal mood and affect.

## 2020-05-14 ENCOUNTER — Ambulatory Visit: Payer: 59 | Admitting: Gastroenterology

## 2020-05-15 ENCOUNTER — Encounter (HOSPITAL_COMMUNITY)
Admission: RE | Admit: 2020-05-15 | Discharge: 2020-05-15 | Disposition: A | Discharge status: Home / Self Care | Payer: 59 | Source: Ambulatory Visit | Attending: Internal Medicine | Admitting: Internal Medicine

## 2020-05-15 ENCOUNTER — Other Ambulatory Visit: Payer: Self-pay

## 2020-05-15 VITALS — Wt 319.9 lb

## 2020-05-15 DIAGNOSIS — R0602 Shortness of breath: Secondary | ICD-10-CM | POA: Diagnosis not present

## 2020-05-15 NOTE — Progress Notes (Signed)
Daily Session Note  Patient Details  Name: Meagan Reyes MRN: [id number] Date of Birth: 22-Aug-1955 Referring Provider:     PULMONARY REHAB OTHER RESP ORIENTATION from 04/05/2020 in [place]  Referring Provider Dr. Harrington Challenger      Encounter Date: 05/15/2020  Check In:  Session Check In - 05/15/20 1112      Check-In   Supervising physician immediately available to respond to emergencies [facility] MD immediately available    Physician(s) Dr. Harl Bowie    Location [facility]    Staff Present Hoy Register, MS, ACSM-CEP, Exercise Physiologist    Virtual Visit No    Medication changes reported     No    Fall or balance concerns reported    No    Tobacco Cessation No Change    Warm-up and Cool-down Performed as group-led instruction    Resistance Training Performed Yes    VAD Patient? No    PAD/SET Patient? No      Pain Assessment   Currently in Pain? No/denies    Pain Score 0-No pain    Multiple Pain Sites No           Capillary Blood Glucose: No results found for this or any previous visit (from the past 24 hour(s)).    Social History   Tobacco Use  Smoking Status Former Smoker   Packs/day: 0.10   Years: 0.25   Pack years: 0.02   Start date: 1976   Quit date: 1976   Years since quitting: 45.8  Smokeless Tobacco Never Used  Tobacco Comment   a couple months when 19 then stopped    Goals Met:  Independence with exercise equipment Exercise tolerated well No report of cardiac concerns or symptoms Strength training completed today  Goals Unmet:  Not Applicable  Comments: checkout time is 1145   Dr. Kathie Dike is Market researcher for [facility].

## 2020-05-17 ENCOUNTER — Encounter (HOSPITAL_COMMUNITY)
Admission: RE | Admit: 2020-05-17 | Discharge: 2020-05-17 | Disposition: A | Discharge status: Home / Self Care | Payer: 59 | Source: Ambulatory Visit | Attending: Internal Medicine | Admitting: Internal Medicine

## 2020-05-17 ENCOUNTER — Other Ambulatory Visit: Payer: Self-pay

## 2020-05-17 DIAGNOSIS — R0602 Shortness of breath: Secondary | ICD-10-CM

## 2020-05-17 NOTE — Progress Notes (Signed)
Daily Session Note  Patient Details  Name: Meagan Reyes MRN: [id number] Date of Birth: [birth year] Referring Provider:     PULMONARY REHAB OTHER RESP ORIENTATION from 04/05/2020 in [place]  Referring Provider Dr. Harrington Challenger      Encounter Date: 05/17/2020  Check In:  Session Check In - 05/17/20 1045      Check-In   Supervising physician immediately available to respond to emergencies [facility] MD immediately available    Physician(s) Domenic Polite    Location [facility]    Staff Present Aundra Dubin, RN, Bjorn Loser, MS, ACSM-CEP, Exercise Physiologist    Virtual Visit No    Medication changes reported     No    Fall or balance concerns reported    No    Tobacco Cessation No Change    Warm-up and Cool-down Performed as group-led instruction    Resistance Training Performed Yes    VAD Patient? No    PAD/SET Patient? No      Pain Assessment   Currently in Pain? No/denies    Pain Score 0-No pain    Multiple Pain Sites No           Capillary Blood Glucose: No results found for this or any previous visit (from the past 24 hour(s)).    Social History   Tobacco Use  Smoking Status Former Smoker  . Packs/day: 0.10  . Years: 0.25  . Pack years: 0.02  . Start date: 63  . Quit date: 50  . Years since quitting: 45.8  Smokeless Tobacco Never Used  Tobacco Comment   a couple months when 19 then stopped    Goals Met:  Proper associated with RPD/PD & O2 Sat Independence with exercise equipment Improved SOB with ADL's Using PLB without cueing & demonstrates good technique Exercise tolerated well No report of cardiac concerns or symptoms Strength training completed today  Goals Unmet:  Not Applicable  Comments: Check out 1145.   Dr. Kathie Dike is Medical Director for [facility].

## 2020-05-17 NOTE — Progress Notes (Signed)
Pulmonary Individual Treatment Plan  Patient Details  Name: Meagan Reyes MRN: [id number] Date of Birth: [birth year] Referring Provider:     PULMONARY REHAB OTHER RESP ORIENTATION from 04/05/2020 in [place]  Referring Provider Dr. Harrington Challenger      Initial Encounter Date:    [facility] from 04/05/2020 in [place]  Date 04/05/20      Visit Diagnosis: SOB (shortness of breath)  Patient's Home Medications on Admission:   Current Outpatient Medications:  .  aspirin 81 MG chewable tablet, Chew 81 mg by mouth at bedtime. , Disp: , Rfl:  .  CALCIUM PO, Take 1 tablet by mouth daily., Disp: , Rfl:  .  cetirizine (ZYRTEC) 10 MG tablet, Take 1 tablet (10 mg total) by mouth daily. (Patient not taking: Reported on 04/05/2020), Disp: 60 tablet, Rfl: 5 .  Cholecalciferol (VITAMIN D3) 25 MCG (1000 UT) CAPS, Take 2,000 Units by mouth 2 (two) times daily., Disp: , Rfl:  .  famotidine (PEPCID) 20 MG tablet, Take 20 mg by mouth 2 (two) times daily. (Patient not taking: Reported on 04/05/2020), Disp: , Rfl:  .  furosemide (LASIX) 40 MG tablet, Take 40 mg by mouth daily. , Disp: , Rfl: 0 .  hydrOXYzine HCl (ATARAX PO), Take by mouth as needed. (Patient not taking: Reported on 04/05/2020), Disp: , Rfl:  .  MAGNESIUM PO, Take 1 tablet by mouth daily., Disp: , Rfl:  .  meloxicam (MOBIC) 15 MG tablet, Take 1 tablet by mouth once daily, Disp: 90 tablet, Rfl: 0 .  olmesartan-hydrochlorothiazide (BENICAR HCT) 20-12.5 MG tablet, Take 1 tablet by mouth daily., Disp: 30 tablet, Rfl: 11 .  Omega-3 Fatty Acids (FISH OIL) 1000 MG CAPS, Take 2,000 capsules by mouth daily.  (Patient not taking: Reported on 04/05/2020), Disp: , Rfl:  .  pantoprazole (PROTONIX) 40 MG tablet, Take 40 mg by mouth daily., Disp: , Rfl:  .  potassium chloride (KLOR-CON) 10 MEQ tablet, Take 1 tablet (10 mEq total) by mouth daily. (Patient not taking: Reported on 04/05/2020), Disp:  90 tablet, Rfl: 3 .  SUPER B COMPLEX/C PO, Take by mouth., Disp: , Rfl:  .  triamcinolone cream (KENALOG) 0.1 %, Apply 1 application topically 2 (two) times daily., Disp: , Rfl:  .  Turmeric 500 MG CAPS, Take by mouth., Disp: , Rfl:  .  UNABLE TO FIND, Take 2 tablets by mouth every morning. Med Name: Moringa Oleifera , Disp: , Rfl:  .  vitamin C (ASCORBIC ACID) 500 MG tablet, Take 500 mg by mouth daily., Disp: , Rfl:  .  Vitamins-Lipotropics (MEGA-CHOL PO), Take 4 capsules by mouth 2 (two) times daily with a meal. Takes Doterra EO mega fish oil, Disp: , Rfl:  .  Zinc 15 MG CAPS, Take 15 mg by mouth daily., Disp: , Rfl:   Current Facility-Administered Medications:  .  nortriptyline (PAMELOR) capsule 10 mg, 10 mg, Oral, QHS, [name], MD  Past Medical History: Past Medical History:  Diagnosis Date  . Chronic diastolic heart failure ([place])   . Hyperlipemia   . Hypertension     Tobacco Use: Social History   Tobacco Use  Smoking Status Former Smoker  . Packs/day: 0.10  . Years: 0.25  . Pack years: 0.02  . Start date: 35  . Quit date: 72  . Years since quitting: 45.8  Smokeless Tobacco Never Used  Tobacco Comment   a couple months when 19 then stopped  Labs: Recent Review Flowsheet Data    Labs for ITP Cardiac and Pulmonary Rehab Latest Ref Rng & Units 06/03/2019 08/17/2019   Cholestrol 100 - 199 mg/dL 219(H) 217(H)   LDLCALC 0 - 99 mg/dL 120(H) 130(H)   HDL >39 mg/dL 53 55   Trlycerides 0 - 149 mg/dL 261(H) 183(H)      Capillary Blood Glucose: No results found for: GLUCAP   Pulmonary Assessment Scores:  Pulmonary Assessment Scores    Row Name 04/05/20 1450         ADL UCSD   ADL Phase Entry     SOB Score total 70     Rest 1     Walk 3     Stairs 4     Bath 3     Dress 3     Shop 5       CAT Score   CAT Score 15       mMRC Score   mMRC Score 3           UCSD: Self-administered rating of dyspnea associated with activities of daily  living (ADLs) 6-point scale (0 = "not at all" to 5 = "maximal or unable to do because of breathlessness")  Scoring Scores range from 0 to 120.  Minimally important difference is 5 units  CAT: CAT can identify the health impairment of COPD patients and is better correlated with disease progression.  CAT has a scoring range of zero to 40. The CAT score is classified into four groups of low (less than 10), medium (10 - 20), high (21-30) and very high (31-40) based on the impact level of disease on health status. A CAT score over 10 suggests significant symptoms.  A worsening CAT score could be explained by an exacerbation, poor medication adherence, poor inhaler technique, or progression of COPD or comorbid conditions.  CAT MCID is 2 points  mMRC: mMRC (Modified Medical Research Council) Dyspnea Scale is used to assess the degree of baseline functional disability in patients of respiratory disease due to dyspnea. No minimal important difference is established. A decrease in score of 1 point or greater is considered a positive change.   Pulmonary Function Assessment:   Exercise Target Goals: Exercise Program Goal: Individual exercise prescription set using results from initial 6 min walk test and THRR while considering  patient's activity barriers and safety.   Exercise Prescription Goal: Initial exercise prescription builds to 30-45 minutes a day of aerobic activity, 2-3 days per week.  Home exercise guidelines will be given to patient during program as part of exercise prescription that the participant will acknowledge.  Activity Barriers & Risk Stratification:   6 Minute Walk:  6 Minute Walk    Row Name 04/05/20 1450         6 Minute Walk   Phase Initial     Distance 1000 feet     Walk Time 6 minutes     # of Rest Breaks 0     MPH 1.9     METS 1.68     RPE 5.89     Perceived Dyspnea  13     VO2 Peak 9     Symptoms No     Resting HR 62 bpm     Resting BP 126/88     Resting  Oxygen Saturation  97 %     Exercise Oxygen Saturation  during 6 min walk 94 %     Max Ex. HR 107 bpm  Max Ex. BP 136/70     2 Minute Post BP 124/70       Interval HR   1 Minute HR 95     2 Minute HR 107     3 Minute HR 102     4 Minute HR 95     5 Minute HR 96     6 Minute HR 96     2 Minute Post HR 61     Interval Heart Rate? Yes       Interval Oxygen   Interval Oxygen? Yes     Baseline Oxygen Saturation % 97 %     1 Minute Oxygen Saturation % 95 %     1 Minute Liters of Oxygen 0 L     2 Minute Oxygen Saturation % 95 %     2 Minute Liters of Oxygen 0 L     3 Minute Oxygen Saturation % 94 %     3 Minute Liters of Oxygen 0 L     4 Minute Oxygen Saturation % 99 %     4 Minute Liters of Oxygen 0 L     5 Minute Oxygen Saturation % 100 %     5 Minute Liters of Oxygen 0 L     6 Minute Oxygen Saturation % 100 %     6 Minute Liters of Oxygen 0 L     2 Minute Post Oxygen Saturation % 100 %     2 Minute Post Liters of Oxygen 0 L            Oxygen Initial Assessment:  Oxygen Initial Assessment - 04/05/20 1449      Home Oxygen   Home Oxygen Device None    Sleep Oxygen Prescription CPAP    Home Exercise Oxygen Prescription None    Home Resting Oxygen Prescription None    Compliance with Home Oxygen Use Yes      Initial 6 min Walk   Oxygen Used None      Program Oxygen Prescription   Program Oxygen Prescription None      Intervention   Short Term Goals To learn and exhibit compliance with exercise, home and travel O2 prescription;To learn and understand importance of monitoring SPO2 with pulse oximeter and demonstrate accurate use of the pulse oximeter.;To learn and understand importance of maintaining oxygen saturations>88%;To learn and demonstrate proper pursed lip breathing techniques or other breathing techniques.;To learn and demonstrate proper use of respiratory medications    Long  Term Goals Exhibits compliance with exercise, home and travel O2  prescription;Verbalizes importance of monitoring SPO2 with pulse oximeter and return demonstration;Maintenance of O2 saturations>88%;Demonstrates proper use of MDI's;Compliance with respiratory medication;Exhibits proper breathing techniques, such as pursed lip breathing or other method taught during program session           Oxygen Re-Evaluation:  Oxygen Re-Evaluation    Row Name 04/18/20 0903 05/15/20 1210           Program Oxygen Prescription   Program Oxygen Prescription None None        Home Oxygen   Home Oxygen Device None None      Sleep Oxygen Prescription CPAP CPAP      Home Exercise Oxygen Prescription None None      Home Resting Oxygen Prescription None None      Compliance with Home Oxygen Use Yes Yes        Goals/Expected Outcomes   Short Term Goals To learn and exhibit  compliance with exercise, home and travel O2 prescription;To learn and understand importance of monitoring SPO2 with pulse oximeter and demonstrate accurate use of the pulse oximeter.;To learn and understand importance of maintaining oxygen saturations>88%;To learn and demonstrate proper pursed lip breathing techniques or other breathing techniques.;To learn and demonstrate proper use of respiratory medications To learn and exhibit compliance with exercise, home and travel O2 prescription;To learn and understand importance of monitoring SPO2 with pulse oximeter and demonstrate accurate use of the pulse oximeter.;To learn and understand importance of maintaining oxygen saturations>88%;To learn and demonstrate proper pursed lip breathing techniques or other breathing techniques.;To learn and demonstrate proper use of respiratory medications      Long  Term Goals Exhibits compliance with exercise, home and travel O2 prescription;Verbalizes importance of monitoring SPO2 with pulse oximeter and return demonstration;Maintenance of O2 saturations>88%;Demonstrates proper use of MDI's;Compliance with respiratory  medication;Exhibits proper breathing techniques, such as pursed lip breathing or other method taught during program session Exhibits compliance with exercise, home and travel O2 prescription;Verbalizes importance of monitoring SPO2 with pulse oximeter and return demonstration;Maintenance of O2 saturations>88%;Demonstrates proper use of MDI's;Compliance with respiratory medication;Exhibits proper breathing techniques, such as pursed lip breathing or other method taught during program session      Goals/Expected Outcomes compliance compliance             Oxygen Discharge (Final Oxygen Re-Evaluation):  Oxygen Re-Evaluation - 05/15/20 1210      Program Oxygen Prescription   Program Oxygen Prescription None      Home Oxygen   Home Oxygen Device None    Sleep Oxygen Prescription CPAP    Home Exercise Oxygen Prescription None    Home Resting Oxygen Prescription None    Compliance with Home Oxygen Use Yes      Goals/Expected Outcomes   Short Term Goals To learn and exhibit compliance with exercise, home and travel O2 prescription;To learn and understand importance of monitoring SPO2 with pulse oximeter and demonstrate accurate use of the pulse oximeter.;To learn and understand importance of maintaining oxygen saturations>88%;To learn and demonstrate proper pursed lip breathing techniques or other breathing techniques.;To learn and demonstrate proper use of respiratory medications    Long  Term Goals Exhibits compliance with exercise, home and travel O2 prescription;Verbalizes importance of monitoring SPO2 with pulse oximeter and return demonstration;Maintenance of O2 saturations>88%;Demonstrates proper use of MDI's;Compliance with respiratory medication;Exhibits proper breathing techniques, such as pursed lip breathing or other method taught during program session    Goals/Expected Outcomes compliance           Initial Exercise Prescription:  Initial Exercise Prescription - 04/05/20 1400       Date of Initial Exercise RX and Referring Provider   Date 04/05/20    Referring Provider Dr. Harrington Challenger    Expected Discharge Date 08/09/20      Treadmill   MPH 1.4    Grade 0    Minutes 17      NuStep   Level 1    SPM 80    Minutes 22      Prescription Details   Frequency (times per week) 2    Duration Progress to 30 minutes of continuous aerobic without signs/symptoms of physical distress      Intensity   Ratings of Perceived Exertion 11-13    Perceived Dyspnea 0-4      Resistance Training   Training Prescription Yes    Weight 2 lbs    Reps 10-15  Perform Capillary Blood Glucose checks as needed.  Exercise Prescription Changes:   Exercise Prescription Changes    Row Name 04/24/20 1225 05/08/20 1200 05/15/20 1200         Response to Exercise   Blood Pressure (Admit) 112/66 110/70 120/78     Blood Pressure (Exercise) 134/64 160/82 144/70     Blood Pressure (Exit) 100/52 114/60 110/60     Heart Rate (Admit) 70 bpm 64 bpm 70 bpm     Heart Rate (Exercise) 108 bpm 103 bpm 108 bpm     Heart Rate (Exit) 82 bpm 73 bpm 72 bpm     Oxygen Saturation (Admit) 97 % 99 % 97 %     Oxygen Saturation (Exercise) 97 % 94 % 96 %     Oxygen Saturation (Exit) 98 % 98 % 97 %     Rating of Perceived Exertion (Exercise) 12 10 12      Perceived Dyspnea (Exercise) 11 10 12      Duration Continue with 30 min of aerobic exercise without signs/symptoms of physical distress. Continue with 30 min of aerobic exercise without signs/symptoms of physical distress. Continue with 30 min of aerobic exercise without signs/symptoms of physical distress.     Intensity THRR unchanged THRR unchanged THRR unchanged       Progression   Progression Continue to progress workloads to maintain intensity without signs/symptoms of physical distress. Continue to progress workloads to maintain intensity without signs/symptoms of physical distress. Continue to progress workloads to maintain intensity without  signs/symptoms of physical distress.       Resistance Training   Training Prescription Yes Yes Yes     Weight 2 lbs 2 lbs 2 lbs     Reps 10-15 10-15 10-15     Time 10 Minutes 10 Minutes 10 Minutes       Treadmill   MPH 2.1 2.2 2.2     Grade 0 0 0     Minutes 17 17 17      METs 2.61 2.68 2.68       NuStep   Level 2 3 3      SPM 91 100 104     Minutes 22 22 22      METs 2.3 2.1 1.9            Exercise Comments:   Exercise Comments    Row Name 04/12/20 1236           Exercise Comments Pt completed her first exercise session in pulmonary rehab. She was able to tolerate the treadmill and the stepper well with no complaints.              Exercise Goals and Review:   Exercise Goals    Row Name 04/05/20 1500 04/18/20 0904 05/15/20 1212         Exercise Goals   Increase Physical Activity Yes Yes Yes     Intervention Provide advice, education, support and counseling about physical activity/exercise needs.;Develop an individualized exercise prescription for aerobic and resistive training based on initial evaluation findings, risk stratification, comorbidities and participant's personal goals. Provide advice, education, support and counseling about physical activity/exercise needs.;Develop an individualized exercise prescription for aerobic and resistive training based on initial evaluation findings, risk stratification, comorbidities and participant's personal goals. Provide advice, education, support and counseling about physical activity/exercise needs.;Develop an individualized exercise prescription for aerobic and resistive training based on initial evaluation findings, risk stratification, comorbidities and participant's personal goals.     Expected Outcomes Short Term: Attend  rehab on a regular basis to increase amount of physical activity.;Long Term: Add in home exercise to make exercise part of routine and to increase amount of physical activity.;Long Term: Exercising  regularly at least 3-5 days a week. Short Term: Attend rehab on a regular basis to increase amount of physical activity.;Long Term: Add in home exercise to make exercise part of routine and to increase amount of physical activity.;Long Term: Exercising regularly at least 3-5 days a week. Short Term: Attend rehab on a regular basis to increase amount of physical activity.;Long Term: Add in home exercise to make exercise part of routine and to increase amount of physical activity.;Long Term: Exercising regularly at least 3-5 days a week.     Increase Strength and Stamina Yes Yes Yes     Intervention Provide advice, education, support and counseling about physical activity/exercise needs.;Develop an individualized exercise prescription for aerobic and resistive training based on initial evaluation findings, risk stratification, comorbidities and participant's personal goals. Provide advice, education, support and counseling about physical activity/exercise needs.;Develop an individualized exercise prescription for aerobic and resistive training based on initial evaluation findings, risk stratification, comorbidities and participant's personal goals. Provide advice, education, support and counseling about physical activity/exercise needs.;Develop an individualized exercise prescription for aerobic and resistive training based on initial evaluation findings, risk stratification, comorbidities and participant's personal goals.     Expected Outcomes Short Term: Increase workloads from initial exercise prescription for resistance, speed, and METs.;Short Term: Perform resistance training exercises routinely during rehab and add in resistance training at home;Long Term: Improve cardiorespiratory fitness, muscular endurance and strength as measured by increased METs and functional capacity (6MWT) Short Term: Increase workloads from initial exercise prescription for resistance, speed, and METs.;Short Term: Perform resistance  training exercises routinely during rehab and add in resistance training at home;Long Term: Improve cardiorespiratory fitness, muscular endurance and strength as measured by increased METs and functional capacity (6MWT) Short Term: Increase workloads from initial exercise prescription for resistance, speed, and METs.;Short Term: Perform resistance training exercises routinely during rehab and add in resistance training at home;Long Term: Improve cardiorespiratory fitness, muscular endurance and strength as measured by increased METs and functional capacity (6MWT)     Able to understand and use rate of perceived exertion (RPE) scale Yes Yes Yes     Intervention Provide education and explanation on how to use RPE scale Provide education and explanation on how to use RPE scale Provide education and explanation on how to use RPE scale     Expected Outcomes Short Term: Able to use RPE daily in rehab to express subjective intensity level;Long Term:  Able to use RPE to guide intensity level when exercising independently Short Term: Able to use RPE daily in rehab to express subjective intensity level;Long Term:  Able to use RPE to guide intensity level when exercising independently Short Term: Able to use RPE daily in rehab to express subjective intensity level;Long Term:  Able to use RPE to guide intensity level when exercising independently     Able to understand and use Dyspnea scale Yes Yes Yes     Intervention Provide education and explanation on how to use Dyspnea scale Provide education and explanation on how to use Dyspnea scale Provide education and explanation on how to use Dyspnea scale     Expected Outcomes Short Term: Able to use Dyspnea scale daily in rehab to express subjective sense of shortness of breath during exertion;Long Term: Able to use Dyspnea scale to guide intensity level when exercising independently  Short Term: Able to use Dyspnea scale daily in rehab to express subjective sense of  shortness of breath during exertion;Long Term: Able to use Dyspnea scale to guide intensity level when exercising independently Short Term: Able to use Dyspnea scale daily in rehab to express subjective sense of shortness of breath during exertion;Long Term: Able to use Dyspnea scale to guide intensity level when exercising independently     Knowledge and understanding of Target Heart Rate Range (THRR) Yes Yes Yes     Intervention Provide education and explanation of THRR including how the numbers were predicted and where they are located for reference Provide education and explanation of THRR including how the numbers were predicted and where they are located for reference Provide education and explanation of THRR including how the numbers were predicted and where they are located for reference     Expected Outcomes Short Term: Able to state/look up THRR;Long Term: Able to use THRR to govern intensity when exercising independently;Short Term: Able to use daily as guideline for intensity in rehab Short Term: Able to state/look up THRR;Long Term: Able to use THRR to govern intensity when exercising independently;Short Term: Able to use daily as guideline for intensity in rehab Short Term: Able to state/look up THRR;Long Term: Able to use THRR to govern intensity when exercising independently;Short Term: Able to use daily as guideline for intensity in rehab     Understanding of Exercise Prescription Yes Yes Yes     Intervention Provide education, explanation, and written materials on patient's individual exercise prescription Provide education, explanation, and written materials on patient's individual exercise prescription Provide education, explanation, and written materials on patient's individual exercise prescription     Expected Outcomes Short Term: Able to explain program exercise prescription;Long Term: Able to explain home exercise prescription to exercise independently Short Term: Able to explain  program exercise prescription;Long Term: Able to explain home exercise prescription to exercise independently Short Term: Able to explain program exercise prescription;Long Term: Able to explain home exercise prescription to exercise independently            Exercise Goals Re-Evaluation :  Exercise Goals Re-Evaluation    Row Name 04/18/20 0904 05/15/20 1213           Exercise Goal Re-Evaluation   Exercise Goals Review Increase Physical Activity;Able to understand and use rate of perceived exertion (RPE) scale;Increase Strength and Stamina;Able to understand and use Dyspnea scale;Knowledge and understanding of Target Heart Rate Range (THRR);Understanding of Exercise Prescription Increase Physical Activity;Able to understand and use rate of perceived exertion (RPE) scale;Increase Strength and Stamina;Able to understand and use Dyspnea scale;Knowledge and understanding of Target Heart Rate Range (THRR);Understanding of Exercise Prescription      Comments Pt has attended 2 exercise sessions. She has a positive attitude and will likely progress quickly. She currently exercises at 2.1 METs on the stepper. Will continue to monitor and progress as able. Pt has attended 8 exercise sessions. She remains positive and is progressing, but she has a lingering shoulder injury that is holding her back from her full potential. She currently exercises at 1.9 METs on the stepper. Will continue to monitor and progress as able.      Expected Outcomes Through exercise at rehab and by engaging in a home exercise program, the pt will reach their goals. Through exercise at rehab and by engaging in a home exercise program, the pt will reach their goals.             Discharge  Exercise Prescription (Final Exercise Prescription Changes):  Exercise Prescription Changes - 05/15/20 1200      Response to Exercise   Blood Pressure (Admit) 120/78    Blood Pressure (Exercise) 144/70    Blood Pressure (Exit) 110/60    Heart  Rate (Admit) 70 bpm    Heart Rate (Exercise) 108 bpm    Heart Rate (Exit) 72 bpm    Oxygen Saturation (Admit) 97 %    Oxygen Saturation (Exercise) 96 %    Oxygen Saturation (Exit) 97 %    Rating of Perceived Exertion (Exercise) 12    Perceived Dyspnea (Exercise) 12    Duration Continue with 30 min of aerobic exercise without signs/symptoms of physical distress.    Intensity THRR unchanged      Progression   Progression Continue to progress workloads to maintain intensity without signs/symptoms of physical distress.      Resistance Training   Training Prescription Yes    Weight 2 lbs    Reps 10-15    Time 10 Minutes      Treadmill   MPH 2.2    Grade 0    Minutes 17    METs 2.68      NuStep   Level 3    SPM 104    Minutes 22    METs 1.9           Nutrition:  Target Goals: Understanding of nutrition guidelines, daily intake of sodium 1500mg , cholesterol 200mg , calories 30% from fat and 7% or less from saturated fats, daily to have 5 or more servings of fruits and vegetables.  Biometrics:  Pre Biometrics - 04/05/20 1500      Pre Biometrics   Height 5\' 8"  (1.727 m)    Weight 142.1 kg    Waist Circumference 52 inches    Hip Circumference 63 inches    Waist to Hip Ratio 0.83 %    BMI (Calculated) 47.64    Triceps Skinfold 47 mm    % Body Fat 57.3 %    Grip Strength 34.6 kg    Flexibility 0 in    Single Leg Stand 19.23 seconds            Nutrition Therapy Plan and Nutrition Goals:  Nutrition Therapy & Goals - 05/14/20 1410      Personal Nutrition Goals   Comments Continue to provide education through hand-outs regarding nutrition and healthier choices.      Intervention Plan   Intervention Nutrition handout(s) given to patient.           Nutrition Assessments:  Nutrition Assessments - 04/05/20 1503      MEDFICTS Scores   Pre Score 36           Nutrition Goals Re-Evaluation:   Nutrition Goals Discharge (Final Nutrition Goals  Re-Evaluation):   Psychosocial: Target Goals: Acknowledge presence or absence of significant depression and/or stress, maximize coping skills, provide positive support system. Participant is able to verbalize types and ability to use techniques and skills needed for reducing stress and depression.  Initial Review & Psychosocial Screening:  Initial Psych Review & Screening - 04/05/20 1511      Initial Review   Current issues with None Identified      Family Dynamics   Good Support System? Yes    Comments Patient lives with her husband. She has 4 children and 9 grandchildren. She says she is very involved in their lives and has a great relationship with them. 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 She denies any depression or anxiety. She says she has always had low self-esteem and continues to struggle with this. Will continue to monitor.      Barriers   Psychosocial barriers to participate in program Psychosocial barriers identified (see note)      Screening Interventions   Interventions Encouraged to exercise;Provide feedback about the scores to participant           Quality of Life Scores:  Quality of Life - 04/05/20 1514      Quality of Life   Select Quality of Life      Quality of Life Scores   Health/Function Pre 19.5 %    Socioeconomic Pre 23.71 %    Psych/Spiritual Pre 20.64 %    Family Pre 24 %    GLOBAL Pre 21.21 %          Scores of 19 and below usually indicate a poorer quality of life in these areas.  A difference of  2-3 points is a clinically meaningful difference.  A difference of 2-3 points in the total score of the Quality of Life Index has been associated with significant improvement in overall quality of life, self-image, physical symptoms, and general health in studies assessing change in quality of life.   PHQ-9: Recent Review Flowsheet Data    Depression screen [facility] 2/9 04/05/2020    Decreased Interest 0   Down, Depressed, Hopeless 0   PHQ - 2 Score 0   Altered sleeping 3   Tired, decreased energy 3   Change in appetite 2   Feeling bad or failure about yourself  2   Trouble concentrating 0   Moving slowly or fidgety/restless 0   Suicidal thoughts 0   PHQ-9 Score 10   Difficult doing work/chores Not difficult at all     Interpretation of Total Score  Total Score Depression Severity:  1-4 = Minimal depression, 5-9 = Mild depression, 10-14 = Moderate depression, 15-19 = Moderately severe depression, 20-27 = Severe depression   Psychosocial Evaluation and Intervention:  Psychosocial Evaluation - 04/05/20 1515      Psychosocial Evaluation & Interventions   Interventions Stress management education;Relaxation education;Encouraged to exercise with the program and follow exercise prescription    Comments Patient's initial QOL score was 21.21 and her PHQ-9 score was 10 with no psychosocial issues identified at her orientation visit. She says she has struggled with a low self-esteem her whole life but has a positive outlook. Will continue to monitor.    Expected Outcomes Patient will have no psychosocial issues identified at discharge.    Continue Psychosocial Services  No Follow up required           Psychosocial Re-Evaluation:  Psychosocial Re-Evaluation    [place] Name 04/19/20 0753 05/14/20 1411           Psychosocial Re-Evaluation   Current issues with None Identified None Identified      Comments Patient continues to have no psychosocial issues identified. Will continue to monitor. Patient continues to have no psychosocial issues identified. Will continue to monitor.      Expected Outcomes Patient will have no psychosocial issues identifed at discharge. Patient will have no psychosocial issues identifed at discharge.      Interventions Relaxation education;Stress management education;Encouraged to attend Pulmonary Rehabilitation for the exercise Relaxation  education;Stress management education;Encouraged to attend Pulmonary Rehabilitation for the exercise  Continue Psychosocial Services  No Follow up required No Follow up required             Psychosocial Discharge (Final Psychosocial Re-Evaluation):  Psychosocial Re-Evaluation - 05/14/20 1411      Psychosocial Re-Evaluation   Current issues with None Identified    Comments Patient continues to have no psychosocial issues identified. Will continue to monitor.    Expected Outcomes Patient will have no psychosocial issues identifed at discharge.    Interventions Relaxation education;Stress management education;Encouraged to attend Pulmonary Rehabilitation for the exercise    Continue Psychosocial Services  No Follow up required            Education: Education Goals: Education classes will be provided on a weekly basis, covering required topics. Participant will state understanding/return demonstration of topics presented.  Learning Barriers/Preferences:  Learning Barriers/Preferences - 04/05/20 1503      Learning Barriers/Preferences   Learning Barriers None    Learning Preferences Skilled Demonstration           Education Topics: How Lungs Work and Diseases: - Discuss the anatomy of the lungs and diseases that can affect the lungs, such as COPD.   Exercise: -Discuss the importance of exercise, FITT principles of exercise, normal and abnormal responses to exercise, and how to exercise safely.   Environmental Irritants: -Discuss types of environmental irritants and how to limit exposure to environmental irritants.   PULMONARY REHAB OTHER RESPIRATORY from 05/03/2020 in [place]  Date 05/03/20  Educator Etheleen Mayhew  Instruction Review Code 1- Verbalizes Understanding      Meds/Inhalers and oxygen: - Discuss respiratory medications, definition of an inhaler and oxygen, and the proper way to use an inhaler and oxygen.   Energy Saving  Techniques: - Discuss methods to conserve energy and decrease shortness of breath when performing activities of daily living.    Bronchial Hygiene / Breathing Techniques: - Discuss breathing mechanics, pursed-lip breathing technique,  proper posture, effective ways to clear airways, and other functional breathing techniques   Cleaning Equipment: - Provides group verbal and written instruction about the health risks of elevated stress, cause of high stress, and healthy ways to reduce stress.   Nutrition I: Fats: - Discuss the types of cholesterol, what cholesterol does to the body, and how cholesterol levels can be controlled.   Nutrition II: Labels: -Discuss the different components of food labels and how to read food labels.   Respiratory Infections: - Discuss the signs and symptoms of respiratory infections, ways to prevent respiratory infections, and the importance of seeking medical treatment when having a respiratory infection.   Stress I: Signs and Symptoms: - Discuss the causes of stress, how stress may lead to anxiety and depression, and ways to limit stress.   Stress II: Relaxation: -Discuss relaxation techniques to limit stress.   Oxygen for Home/Travel: - Discuss how to prepare for travel when on oxygen and proper ways to transport and store oxygen to ensure safety.   Knowledge Questionnaire Score:  Knowledge Questionnaire Score - 04/05/20 1503      Knowledge Questionnaire Score   Pre Score 15/18           Core Components/Risk Factors/Patient Goals at Admission:  Personal Goals and Risk Factors at Admission - 04/05/20 1510      Core Components/Risk Factors/Patient Goals on Admission    Weight Management Obesity    Personal Goal Other Yes    Personal Goal Be able to do her ADL's without getting SOB.  Intervention Patient will attend PR 2 days/week and supplement with exercise at home 3 days/week.    Expected Outcomes Patient will complete the program  meeting her personal and program goals.           Core Components/Risk Factors/Patient Goals Review:   Goals and Risk Factor Review    Row Name 04/19/20 0748 05/14/20 1411           Core Components/Risk Factors/Patient Goals Review   Personal Goals Review Weight Management/Obesity;Other Weight Management/Obesity;Other      Review Patient is new to the program.  She has completed 3 sessions without difficulty. Will continue to montior as she works toward meeting her personal and program goals. Patient has completed 7 sessions gaining 3 lbs since her initial visit. She is doing well in pulmonary rehab with progression and consistent attendance. Her personal goals are to be able to do her ADL's without getting SOB. She pushes herself during the sessions and feels she is making progress toward meeting her goals. Will continue to monitor.      Expected Outcomes Patient will complete the program meeting her personal and program goals. Patient will complete the program meeting her personal and program goals.             Core Components/Risk Factors/Patient Goals at Discharge (Final Review):   Goals and Risk Factor Review - 05/14/20 1411      Core Components/Risk Factors/Patient Goals Review   Personal Goals Review Weight Management/Obesity;Other    Review Patient has completed 7 sessions gaining 3 lbs since her initial visit. She is doing well in pulmonary rehab with progression and consistent attendance. Her personal goals are to be able to do her ADL's without getting SOB. She pushes herself during the sessions and feels she is making progress toward meeting her goals. Will continue to monitor.    Expected Outcomes Patient will complete the program meeting her personal and program goals.           ITP Comments:   Comments: ITP REVIEW Pt is making expected progress toward pulmonary rehab goals after completing 7 sessions. Recommend continued exercise, life style modification,  education, and utilization of breathing techniques to increase stamina and strength and decrease shortness of breath with exertion.

## 2020-05-17 NOTE — Progress Notes (Signed)
I have reviewed a Home Exercise Prescription with Eda Paschal . Lemon is not currently exercising at home.  The patient was advised to walk 1 days a week for 30-45 minutes.  Manuela Schwartz and I discussed how to progress their exercise prescription.  The patient stated that their goals were to lose weight.  The patient stated that they understand the exercise prescription.  We reviewed exercise guidelines, target heart rate during exercise, RPE Scale, weather conditions, NTG use, endpoints for exercise, warmup and cool down.  Patient is encouraged to come to me with any questions. I will continue to follow up with the patient to assist them with progression and safety.

## 2020-05-22 ENCOUNTER — Other Ambulatory Visit: Payer: Self-pay

## 2020-05-22 ENCOUNTER — Encounter (HOSPITAL_COMMUNITY)
Admission: RE | Admit: 2020-05-22 | Discharge: 2020-05-22 | Disposition: A | Discharge status: Home / Self Care | Payer: 59 | Source: Ambulatory Visit | Attending: Internal Medicine | Admitting: Internal Medicine

## 2020-05-22 DIAGNOSIS — R0602 Shortness of breath: Secondary | ICD-10-CM | POA: Insufficient documentation

## 2020-05-22 NOTE — Progress Notes (Signed)
Daily Session Note  Patient Details  Name: Meagan Reyes MRN: [id number] Date of Birth: 1956-06-08 Referring Provider:     PULMONARY REHAB OTHER RESP ORIENTATION from 04/05/2020 in [place]  Referring Provider Dr. Harrington Challenger      Encounter Date: 05/22/2020  Check In:  Session Check In - 05/22/20 1045      Check-In   Supervising physician immediately available to respond to emergencies [facility] MD immediately available    Physician(s) Branch    Location [facility]    Staff Present Geanie Cooley, RN;Dalton Kris Mouton, MS, ACSM-CEP, Exercise Physiologist    Virtual Visit No    Medication changes reported     No    Fall or balance concerns reported    No    Tobacco Cessation No Change    Warm-up and Cool-down Performed as group-led instruction    Resistance Training Performed Yes    VAD Patient? No    PAD/SET Patient? No      Pain Assessment   Currently in Pain? No/denies    Pain Score 0-No pain    Multiple Pain Sites No           Capillary Blood Glucose: No results found for this or any previous visit (from the past 24 hour(s)).    Social History   Tobacco Use  Smoking Status Former Smoker  . Packs/day: 0.10  . Years: 0.25  . Pack years: 0.02  . Start date: 59  . Quit date: 65  . Years since quitting: 45.8  Smokeless Tobacco Never Used  Tobacco Comment   a couple months when 19 then stopped    Goals Met:  Proper associated with RPD/PD & O2 Sat Independence with exercise equipment Improved SOB with ADL's Using PLB without cueing & demonstrates good technique Exercise tolerated well No report of cardiac concerns or symptoms Strength training completed today  Goals Unmet:  Not Applicable  Comments: check out @ 11:45   Dr. Kathie Dike is Medical Director for [facility] Pulmonary Rehab.

## 2020-05-24 ENCOUNTER — Other Ambulatory Visit: Payer: Self-pay

## 2020-05-24 ENCOUNTER — Encounter (HOSPITAL_COMMUNITY)
Admission: RE | Admit: 2020-05-24 | Discharge: 2020-05-24 | Disposition: A | Discharge status: Home / Self Care | Payer: 59 | Source: Ambulatory Visit | Attending: Internal Medicine | Admitting: Internal Medicine

## 2020-05-24 DIAGNOSIS — R0602 Shortness of breath: Secondary | ICD-10-CM | POA: Diagnosis not present

## 2020-05-24 NOTE — Progress Notes (Signed)
Daily Session Note  Patient Details  Name: Meagan Reyes MRN: [id number] Date of Birth: 03/26/1956 Referring Provider:     PULMONARY REHAB OTHER RESP ORIENTATION from 04/05/2020 in [place]  Referring Provider Dr. Harrington Challenger      Encounter Date: 05/24/2020  Check In:  Session Check In - 05/24/20 1117      Check-In   Supervising physician immediately available to respond to emergencies [facility] MD immediately available    Physician(s) Branch    Location [facility]    Staff Present Hoy Register, MS, ACSM-CEP, Exercise Physiologist;Debra Wynetta Emery, RN, BSN    Virtual Visit No    Medication changes reported     No    Fall or balance concerns reported    No    Tobacco Cessation No Change    Warm-up and Cool-down Performed as group-led instruction    Resistance Training Performed Yes    VAD Patient? No    PAD/SET Patient? No      Pain Assessment   Currently in Pain? No/denies    Pain Score 0-No pain    Multiple Pain Sites No           Capillary Blood Glucose: No results found for this or any previous visit (from the past 24 hour(s)).    Social History   Tobacco Use  Smoking Status Former Smoker  . Packs/day: 0.10  . Years: 0.25  . Pack years: 0.02  . Start date: 40  . Quit date: 39  . Years since quitting: 45.8  Smokeless Tobacco Never Used  Tobacco Comment   a couple months when 35 then stopped    Goals Met:  Independence with exercise equipment Exercise tolerated well No report of cardiac concerns or symptoms Strength training completed today  Goals Unmet:  Not Applicable  Comments: checkout time is 1145   Dr. Kathie Dike is Medical Director for [facility] Pulmonary Rehab.

## 2020-05-29 ENCOUNTER — Other Ambulatory Visit: Payer: Self-pay

## 2020-05-29 ENCOUNTER — Encounter (HOSPITAL_COMMUNITY)
Admission: RE | Admit: 2020-05-29 | Discharge: 2020-05-29 | Disposition: A | Discharge status: Home / Self Care | Payer: 59 | Source: Ambulatory Visit | Attending: Internal Medicine | Admitting: Internal Medicine

## 2020-05-29 VITALS — Wt 316.8 lb

## 2020-05-29 DIAGNOSIS — R0602 Shortness of breath: Secondary | ICD-10-CM | POA: Diagnosis not present

## 2020-05-29 NOTE — Progress Notes (Signed)
Daily Session Note  Patient Details  Name: Meagan Reyes MRN: [id number] Date of Birth: [birth year] Referring Provider:     PULMONARY REHAB OTHER RESP ORIENTATION from 04/05/2020 in [place]  Referring Provider Dr. Harrington Challenger      Encounter Date: 05/29/2020  Check In:  Session Check In - 05/29/20 1049      Check-In   Supervising physician immediately available to respond to emergencies [facility] MD immediately available    Physician(s) Dr. Domenic Polite    Location [facility]    Staff Present Hoy Register, MS, ACSM-CEP, Exercise Physiologist;Phyllis Billingsley, RN    Virtual Visit No    Medication changes reported     No    Fall or balance concerns reported    No    Tobacco Cessation No Change    Warm-up and Cool-down Performed as group-led instruction    Resistance Training Performed Yes    VAD Patient? No    PAD/SET Patient? No      Pain Assessment   Currently in Pain? No/denies    Pain Score 0-No pain    Multiple Pain Sites No           Capillary Blood Glucose: No results found for this or any previous visit (from the past 24 hour(s)).    Social History   Tobacco Use  Smoking Status Former Smoker  . Packs/day: 0.10  . Years: 0.25  . Pack years: 0.02  . Start date: 72  . Quit date: 43  . Years since quitting: 45.8  Smokeless Tobacco Never Used  Tobacco Comment   a couple months when 20 then stopped    Goals Met:  Independence with exercise equipment Exercise tolerated well No report of cardiac concerns or symptoms Strength training completed today  Goals Unmet:  Not Applicable  Comments: checkout time is 1145   Dr. Kathie Dike is Medical Director for [facility] Pulmonary Rehab.

## 2020-05-31 ENCOUNTER — Encounter (HOSPITAL_COMMUNITY): Admission: RE | Admit: 2020-05-31 | Payer: 59 | Source: Ambulatory Visit

## 2020-05-31 ENCOUNTER — Other Ambulatory Visit: Payer: Self-pay

## 2020-06-04 ENCOUNTER — Other Ambulatory Visit: Payer: Self-pay | Admitting: Orthopedic Surgery

## 2020-06-04 DIAGNOSIS — M1710 Unilateral primary osteoarthritis, unspecified knee: Secondary | ICD-10-CM

## 2020-06-05 ENCOUNTER — Other Ambulatory Visit: Payer: Self-pay

## 2020-06-05 ENCOUNTER — Encounter (HOSPITAL_COMMUNITY)
Admission: RE | Admit: 2020-06-05 | Discharge: 2020-06-05 | Disposition: A | Discharge status: Home / Self Care | Payer: 59 | Source: Ambulatory Visit | Attending: Internal Medicine | Admitting: Internal Medicine

## 2020-06-05 VITALS — Wt 321.4 lb

## 2020-06-05 DIAGNOSIS — R0602 Shortness of breath: Secondary | ICD-10-CM | POA: Diagnosis not present

## 2020-06-05 NOTE — Progress Notes (Signed)
Daily Session Note  Patient Details  Name: Meagan Reyes MRN: [id number] Date of Birth: 1955-09-21 Referring Provider:     PULMONARY REHAB OTHER RESP ORIENTATION from 04/05/2020 in [place]  Referring Provider Dr. Harrington Challenger      Encounter Date: 06/05/2020  Check In:  Session Check In - 06/05/20 1043      Check-In   Supervising physician immediately available to respond to emergencies [facility] MD immediately available    Physician(s) Dr. Harrington Challenger    Location [facility]    Staff Present Hoy Register, MS, ACSM-CEP, Exercise Physiologist;Phyllis Billingsley, RN;Madison Audria Nine, MS, Exercise Physiologist    Virtual Visit No    Medication changes reported     No    Fall or balance concerns reported    No    Tobacco Cessation No Change    Warm-up and Cool-down Performed as group-led instruction    Resistance Training Performed Yes    VAD Patient? No    PAD/SET Patient? No      Pain Assessment   Currently in Pain? No/denies    Pain Score 0-No pain    Multiple Pain Sites No           Capillary Blood Glucose: No results found for this or any previous visit (from the past 24 hour(s)).    Social History   Tobacco Use  Smoking Status Former Smoker  . Packs/day: 0.10  . Years: 0.25  . Pack years: 0.02  . Start date: 77  . Quit date: 49  . Years since quitting: 45.9  Smokeless Tobacco Never Used  Tobacco Comment   a couple months when 19 then stopped    Goals Met:  Independence with exercise equipment Exercise tolerated well No report of cardiac concerns or symptoms Strength training completed today  Goals Unmet:  Not Applicable  Comments: checkout time is 1145   Dr. Kathie Dike is Medical Director for [facility] Pulmonary Rehab.

## 2020-06-07 ENCOUNTER — Encounter (HOSPITAL_COMMUNITY)
Admission: RE | Admit: 2020-06-07 | Discharge: 2020-06-07 | Disposition: A | Discharge status: Home / Self Care | Payer: 59 | Source: Ambulatory Visit | Attending: Internal Medicine | Admitting: Internal Medicine

## 2020-06-07 ENCOUNTER — Ambulatory Visit (INDEPENDENT_AMBULATORY_CARE_PROVIDER_SITE_OTHER): Payer: 59 | Admitting: Internal Medicine

## 2020-06-07 ENCOUNTER — Other Ambulatory Visit (HOSPITAL_COMMUNITY)
Admission: RE | Admit: 2020-06-07 | Discharge: 2020-06-07 | Disposition: A | Discharge status: Home / Self Care | Payer: 59 | Source: Ambulatory Visit | Attending: Internal Medicine | Admitting: Internal Medicine

## 2020-06-07 ENCOUNTER — Encounter: Payer: Self-pay | Admitting: Internal Medicine

## 2020-06-07 ENCOUNTER — Other Ambulatory Visit: Payer: Self-pay

## 2020-06-07 DIAGNOSIS — I10 Essential (primary) hypertension: Secondary | ICD-10-CM

## 2020-06-07 DIAGNOSIS — R0602 Shortness of breath: Secondary | ICD-10-CM | POA: Diagnosis not present

## 2020-06-07 DIAGNOSIS — M130 Polyarthritis, unspecified: Secondary | ICD-10-CM | POA: Diagnosis present

## 2020-06-07 DIAGNOSIS — R0609 Other forms of dyspnea: Secondary | ICD-10-CM

## 2020-06-07 DIAGNOSIS — R0600 Dyspnea, unspecified: Secondary | ICD-10-CM

## 2020-06-07 LAB — CBC WITH DIFFERENTIAL/PLATELET
Abs Immature Granulocytes: 0.06 10*3/uL (ref 0.00–0.07)
Basophils Absolute: 0 10*3/uL (ref 0.0–0.1)
Basophils Relative: 0 %
Eosinophils Absolute: 0.3 10*3/uL (ref 0.0–0.5)
Eosinophils Relative: 3 %
HCT: 36.1 % (ref 36.0–46.0)
Hemoglobin: 11.6 g/dL — ABNORMAL LOW (ref 12.0–15.0)
Immature Granulocytes: 1 %
Lymphocytes Relative: 33 %
Lymphs Abs: 2.9 10*3/uL (ref 0.7–4.0)
MCH: 29.4 pg (ref 26.0–34.0)
MCHC: 32.1 g/dL (ref 30.0–36.0)
MCV: 91.6 fL (ref 80.0–100.0)
Monocytes Absolute: 0.5 10*3/uL (ref 0.1–1.0)
Monocytes Relative: 6 %
Neutro Abs: 5.2 10*3/uL (ref 1.7–7.7)
Neutrophils Relative %: 57 %
Platelets: 231 10*3/uL (ref 150–400)
RBC: 3.94 MIL/uL (ref 3.87–5.11)
RDW: 15 % (ref 11.5–15.5)
WBC: 9 10*3/uL (ref 4.0–10.5)
nRBC: 0 % (ref 0.0–0.2)

## 2020-06-07 LAB — BASIC METABOLIC PANEL
Anion gap: 8 (ref 5–15)
BUN: 26 mg/dL — ABNORMAL HIGH (ref 8–23)
CO2: 30 mmol/L (ref 22–32)
Calcium: 9 mg/dL (ref 8.9–10.3)
Chloride: 102 mmol/L (ref 98–111)
Creatinine, Ser: 0.86 mg/dL (ref 0.44–1.00)
GFR, Estimated: >60 mL/min (ref >60)
Glucose, Bld: 104 mg/dL — ABNORMAL HIGH (ref 70–99)
Potassium: 3.7 mmol/L (ref 3.5–5.1)
Sodium: 140 mmol/L (ref 135–145)

## 2020-06-07 LAB — URIC ACID: Uric Acid, Serum: 7.1 mg/dL (ref 2.5–7.1)

## 2020-06-07 LAB — SEDIMENTATION RATE: Sed Rate: 32 mm/hr — ABNORMAL HIGH (ref 0–22)

## 2020-06-07 NOTE — Progress Notes (Signed)
Meagan Reyes, female    DOB: 19-Jun-1956     MRN: [id number]   Brief patient profile:  64 yowf minimal smoking hx with h/o sinus infections/ tonsils infections as child s/p RT in [place]  And by age 64 or 64 a did fine therafter in [place] since age 18, fine with IUP last in 1989 with baseline wt  low 200's not limited very physically active with only issue was HBP's started on hctz then lisinopril not sure when that was added but  then Sept 1 2020 > p doxy cp, sob/sweaty > [facility] ruled out MI > everything resolved  except sob present since despite neg w/u By Arnoldo Lenis (pulmonary) and Ross (cards) self referred to pulmonary clinic in [place].   Note pfts 05/10/20 nl except for very low ERV    History of Present Illness  04/24/2020  Pulmonary/ 1st Reyes eval/Meagan Reyes  Chief Complaint  Patient presents with  . Follow-up    Former Dr Loanne Drilling pt.  Recently started rehab and feels that it has helped minimally. She states breathing feels restricted due to wearing a mask.   Dyspnea:  Going up ramp / no regular walking   Cough: no Sleep: <30 degrees with electric bed one pillow SABA use: ? Helped a little  rec Stop lisonopril and start olmesartan 20-12.5 one daily in its place. To get the most out of exercise need 30 min Pt informed of the seriousness of COVID 19 infection  rec vaccination     06/07/2020  f/u ov/Meagan Reyes/Meagan Reyes re:  Doe better p started arb w/in a few days but then aches started refractory to her chronic max mobic rx  Chief Complaint  Patient presents with  . Follow-up    shortness of breath is improving  Dyspnea:  Can do treadmill x 20 min and she feels fine and sats good / wears mask at gym 2.2  Mph  Cough: no Sleeping: same elevation  SABA use: none  02: none Aches in joint started w/in a few days and stopped ARB on 06/03/20 and no better since stopped Assoc Joint stiffness mobic 15 mg daily basis at baseline   No obvious day to day or daytime variability or assoc  excess/ purulent sputum or mucus plugs or hemoptysis or cp or chest tightness, subjective wheeze or overt sinus or hb symptoms.   sleeping without nocturnal  or early am exacerbation  of respiratory  c/o's or need for noct saba. Also denies any obvious fluctuation of symptoms with weather or environmental changes or other aggravating or alleviating factors except as outlined above   No unusual exposure hx or h/o childhood pna/ asthma or knowledge of premature birth.  Current Allergies, Complete Past Medical History, Past Surgical History, Family History, and Social History were reviewed in [employer] record.  ROS  The following are not active complaints unless bolded Hoarseness, sore throat, dysphagia, dental problems, itching, sneezing,  nasal congestion or discharge of excess mucus or purulent secretions, ear ache,   fever, chills, sweats, unintended wt loss or wt gain, classically pleuritic or exertional cp,  orthopnea pnd or arm/hand swelling  or leg swelling, presyncope, palpitations, abdominal pain, anorexia, nausea, vomiting, diarrhea  or change in bowel habits or change in bladder habits, change in stools or change in urine, dysuria, hematuria,  rash, arthralgias, visual complaints, headache, numbness, weakness or ataxia or problems with walking or coordination,  change in mood or  memory.        Current  Meds  Medication Sig  . aspirin 81 MG chewable tablet Chew 81 mg by mouth at bedtime.   Marland Kitchen CALCIUM PO Take 1 tablet by mouth daily.  . cetirizine (ZYRTEC) 10 MG tablet Take 1 tablet (10 mg total) by mouth daily.  . Cholecalciferol (VITAMIN D3) 25 MCG (1000 UT) CAPS Take 2,000 Units by mouth 2 (two) times daily.  . famotidine (PEPCID) 20 MG tablet Take 20 mg by mouth 2 (two) times daily.   . furosemide (LASIX) 40 MG tablet Take 40 mg by mouth daily.   . hydrOXYzine HCl (ATARAX PO) Take by mouth as needed.   Marland Kitchen MAGNESIUM PO Take 1 tablet by mouth daily.  . meloxicam  (MOBIC) 15 MG tablet Take 1 tablet by mouth once daily  . Omega-3 Fatty Acids (FISH OIL) 1000 MG CAPS Take 2,000 capsules by mouth daily.   . pantoprazole (PROTONIX) 40 MG tablet Take 40 mg by mouth daily.  . potassium chloride (KLOR-CON) 10 MEQ tablet Take 1 tablet (10 mEq total) by mouth daily.  . SUPER B COMPLEX/C PO Take by mouth.  . triamcinolone cream (KENALOG) 0.1 % Apply 1 application topically 2 (two) times daily.  . Turmeric 500 MG CAPS Take by mouth.  Marland Kitchen UNABLE TO FIND Take 2 tablets by mouth every morning. Med Name: [name]   . vitamin C (ASCORBIC ACID) 500 MG tablet Take 500 mg by mouth daily.  . Vitamins-Lipotropics (MEGA-CHOL PO) Take 4 capsules by mouth 2 (two) times daily with a meal. Takes Doterra EO mega fish oil  . Zinc 15 MG CAPS Take 15 mg by mouth daily.                      Past Medical History:  Diagnosis Date  . Hyperlipemia   . Hypertension      Objective:     Wt Readings from Last 3 Encounters:  06/07/20 (!) 314 lb (142.4 kg)  06/05/20 (!) 321 lb 6.9 oz (145.8 kg)  05/29/20 (!) 316 lb 12.8 oz (143.7 kg)     Vital signs reviewed - Note on arrival 06/07/2020  02 sats  99% on RA and bp 138/78    Obese wf nad   HEENT : pt wearing mask not removed for exam due to covid -19 concerns.    NECK :  without JVD/Nodes/TM/ nl carotid upstrokes bilaterally   LUNGS: no acc muscle use,  Nl contour chest which is clear to A and P bilaterally without cough on insp or exp maneuvers   CV:  RRR  no s3 or murmur or increase in P2, and no edema   ABD:  soft and nontender with nl inspiratory excursion in the supine position. No bruits or organomegaly appreciated, bowel sounds nl  MS:  Slow  gait/ ext warm without deformities, calf tenderness, cyanosis or clubbing No obvious joint restrictions   SKIN: warm and dry without lesions    NEURO:  alert, approp, nl sensorium with  no motor or cerebellar deficits apparent.       Labs ordered/ reviewed:       Chemistry      Component Value Date/Time   NA 140 06/07/2020 1029   NA 139 06/03/2019 1032   K 3.7 06/07/2020 1029   CL 102 06/07/2020 1029   CO2 30 06/07/2020 1029   BUN 26 (H) 06/07/2020 1029   BUN 30 (H) 06/03/2019 1032   CREATININE 0.86 06/07/2020 1029      Component  Value Date/Time   CALCIUM 9.0 06/07/2020 1029   ALKPHOS 106 06/16/2018 1018   AST 34 06/16/2018 1018   ALT 49 (H) 06/16/2018 1018   BILITOT 0.3 06/16/2018 1018        Lab Results  Component Value Date   WBC 9.0 06/07/2020   HGB 11.6 (L) 06/07/2020   HCT 36.1 06/07/2020   MCV 91.6 06/07/2020   PLT 231 06/07/2020      urice acid  06/07/2020   =  7.1   Labs ordered 06/07/2020  : collagen vasc profile    Lab Results  Component Value Date   ESRSEDRATE 32 (H) 06/07/2020   ESRSEDRATE 31 06/03/2019   ESRSEDRATE 22 06/16/2018                    Assessment

## 2020-06-07 NOTE — Progress Notes (Addendum)
Daily Session Note  Patient Details  Name: Meagan Reyes MRN: [id number] Date of Birth: 06/19/1956 Referring Provider:     PULMONARY REHAB OTHER RESP ORIENTATION from 04/05/2020 in [facility] [place]  Referring Provider Dr. Harrington Challenger      Encounter Date: 06/07/2020  Check In:  Session Check In - 06/07/20 1044      Check-In   Supervising physician immediately available to respond to emergencies [facility] MD immediately available    Physician(s) Dr. Harl Bowie    Location [facility]    Staff Present Hoy Register, MS, ACSM-CEP, Exercise Physiologist;Kayin Osment Wynetta Emery, RN, BSN    Virtual Visit No    Medication changes reported     No    Fall or balance concerns reported    No    Tobacco Cessation No Change    Warm-up and Cool-down Performed as group-led instruction    Resistance Training Performed Yes    VAD Patient? No    PAD/SET Patient? No      Pain Assessment   Currently in Pain? Yes    Pain Score 5     Pain Location Shoulder    Pain Orientation Right    Pain Descriptors / Indicators Constant    Pain Type Chronic pain    Pain Radiating Towards No radiation    Pain Onset More than a month ago    Pain Frequency Constant    Pain Relieving Factors Rest.    Multiple Pain Sites No           Capillary Blood Glucose: No results found for this or any previous visit (from the past 24 hour(s)).    Social History   Tobacco Use  Smoking Status Former Smoker  . Packs/day: 0.10  . Years: 0.25  . Pack years: 0.02  . Start date: 10  . Quit date: 34  . Years since quitting: 45.9  Smokeless Tobacco Never Used  Tobacco Comment   a couple months when 19 then stopped    Goals Met:  Proper associated with RPD/PD & O2 Sat Independence with exercise equipment Improved SOB with ADL's Using PLB without cueing & demonstrates good technique Exercise tolerated well No report of cardiac concerns or symptoms Strength training completed today  Goals  Unmet:  Not Applicable  Comments: Check out 1145.   Dr. Kathie Dike is Medical Director for [facility].

## 2020-06-07 NOTE — Progress Notes (Signed)
Pulmonary Individual Treatment Plan  Patient Details  Name: Meagan Reyes MRN: [id number] Date of Birth: [birth year] Referring Provider:     PULMONARY REHAB OTHER RESP ORIENTATION from 04/05/2020 in [facility]  Referring Provider Dr. Harrington Challenger      Initial Encounter Date:    [place] from 04/05/2020 in [facility]  Date 04/05/20      Visit Diagnosis: SOB (shortness of breath)  Patient's Home Medications on Admission:   Current Outpatient Medications:    meloxicam (MOBIC) 15 MG tablet, Take 1 tablet by mouth once daily, Disp: 90 tablet, Rfl: 0   aspirin 81 MG chewable tablet, Chew 81 mg by mouth at bedtime. , Disp: , Rfl:    CALCIUM PO, Take 1 tablet by mouth daily., Disp: , Rfl:    cetirizine (ZYRTEC) 10 MG tablet, Take 1 tablet (10 mg total) by mouth daily. (Patient not taking: Reported on 04/05/2020), Disp: 60 tablet, Rfl: 5   Cholecalciferol (VITAMIN D3) 25 MCG (1000 UT) CAPS, Take 2,000 Units by mouth 2 (two) times daily., Disp: , Rfl:    famotidine (PEPCID) 20 MG tablet, Take 20 mg by mouth 2 (two) times daily. (Patient not taking: Reported on 04/05/2020), Disp: , Rfl:    furosemide (LASIX) 40 MG tablet, Take 40 mg by mouth daily. , Disp: , Rfl: 0   hydrOXYzine HCl (ATARAX PO), Take by mouth as needed. (Patient not taking: Reported on 04/05/2020), Disp: , Rfl:    MAGNESIUM PO, Take 1 tablet by mouth daily., Disp: , Rfl:    olmesartan-hydrochlorothiazide (BENICAR HCT) 20-12.5 MG tablet, Take 1 tablet by mouth daily., Disp: 30 tablet, Rfl: 11   Omega-3 Fatty Acids (FISH OIL) 1000 MG CAPS, Take 2,000 capsules by mouth daily.  (Patient not taking: Reported on 04/05/2020), Disp: , Rfl:    pantoprazole (PROTONIX) 40 MG tablet, Take 40 mg by mouth daily., Disp: , Rfl:    potassium chloride (KLOR-CON) 10 MEQ tablet, Take 1 tablet (10 mEq total) by mouth daily. (Patient not taking: Reported on 04/05/2020), Disp:  90 tablet, Rfl: 3   SUPER B COMPLEX/C PO, Take by mouth., Disp: , Rfl:    triamcinolone cream (KENALOG) 0.1 %, Apply 1 application topically 2 (two) times daily., Disp: , Rfl:    Turmeric 500 MG CAPS, Take by mouth., Disp: , Rfl:    UNABLE TO FIND, Take 2 tablets by mouth every morning. Med Name: Moringa Oleifera , Disp: , Rfl:    vitamin C (ASCORBIC ACID) 500 MG tablet, Take 500 mg by mouth daily., Disp: , Rfl:    Vitamins-Lipotropics (MEGA-CHOL PO), Take 4 capsules by mouth 2 (two) times daily with a meal. Takes Doterra EO mega fish oil, Disp: , Rfl:    Zinc 15 MG CAPS, Take 15 mg by mouth daily., Disp: , Rfl:   Current Facility-Administered Medications:    nortriptyline (PAMELOR) capsule 10 mg, 10 mg, Oral, QHS, [name], MD  Past Medical History: Past Medical History:  Diagnosis Date   Chronic diastolic heart failure ([place])    Hyperlipemia    Hypertension     Tobacco Use: Social History   Tobacco Use  Smoking Status Former Smoker   Packs/day: 0.10   Years: 0.25   Pack years: 0.02   Start date: 1976   Quit date: 1976   Years since quitting: 45.9  Smokeless Tobacco Never Used  Tobacco Comment   a couple months when 19 then stopped  Labs: Recent Review Flowsheet Data    Labs for ITP Cardiac and Pulmonary Rehab Latest Ref Rng & Units 06/03/2019 08/17/2019   Cholestrol 100 - 199 mg/dL 219(H) 217(H)   LDLCALC 0 - 99 mg/dL 120(H) 130(H)   HDL >39 mg/dL 53 55   Trlycerides 0 - 149 mg/dL 261(H) 183(H)      Capillary Blood Glucose: No results found for: GLUCAP   Pulmonary Assessment Scores:  Pulmonary Assessment Scores    Row Name 04/05/20 1450         ADL UCSD   ADL Phase Entry     SOB Score total 70     Rest 1     Walk 3     Stairs 4     Bath 3     Dress 3     Shop 5       CAT Score   CAT Score 15       mMRC Score   mMRC Score 3           UCSD: Self-administered rating of dyspnea associated with activities of daily  living (ADLs) 6-point scale (0 = "not at all" to 5 = "maximal or unable to do because of breathlessness")  Scoring Scores range from 0 to 120.  Minimally important difference is 5 units  CAT: CAT can identify the health impairment of COPD patients and is better correlated with disease progression.  CAT has a scoring range of zero to 40. The CAT score is classified into four groups of low (less than 10), medium (10 - 20), high (21-30) and very high (31-40) based on the impact level of disease on health status. A CAT score over 10 suggests significant symptoms.  A worsening CAT score could be explained by an exacerbation, poor medication adherence, poor inhaler technique, or progression of COPD or comorbid conditions.  CAT MCID is 2 points  mMRC: mMRC (Modified Medical Research Council) Dyspnea Scale is used to assess the degree of baseline functional disability in patients of respiratory disease due to dyspnea. No minimal important difference is established. A decrease in score of 1 point or greater is considered a positive change.   Pulmonary Function Assessment:   Exercise Target Goals: Exercise Program Goal: Individual exercise prescription set using results from initial 6 min walk test and THRR while considering  patients activity barriers and safety.   Exercise Prescription Goal: Initial exercise prescription builds to 30-45 minutes a day of aerobic activity, 2-3 days per week.  Home exercise guidelines will be given to patient during program as part of exercise prescription that the participant will acknowledge.  Activity Barriers & Risk Stratification:   6 Minute Walk:  6 Minute Walk    Row Name 04/05/20 1450         6 Minute Walk   Phase Initial     Distance 1000 feet     Walk Time 6 minutes     # of Rest Breaks 0     MPH 1.9     METS 1.68     RPE 5.89     Perceived Dyspnea  13     VO2 Peak 9     Symptoms No     Resting HR 62 bpm     Resting BP 126/88     Resting  Oxygen Saturation  97 %     Exercise Oxygen Saturation  during 6 min walk 94 %     Max Ex. HR 107 bpm  Max Ex. BP 136/70     2 Minute Post BP 124/70       Interval HR   1 Minute HR 95     2 Minute HR 107     3 Minute HR 102     4 Minute HR 95     5 Minute HR 96     6 Minute HR 96     2 Minute Post HR 61     Interval Heart Rate? Yes       Interval Oxygen   Interval Oxygen? Yes     Baseline Oxygen Saturation % 97 %     1 Minute Oxygen Saturation % 95 %     1 Minute Liters of Oxygen 0 L     2 Minute Oxygen Saturation % 95 %     2 Minute Liters of Oxygen 0 L     3 Minute Oxygen Saturation % 94 %     3 Minute Liters of Oxygen 0 L     4 Minute Oxygen Saturation % 99 %     4 Minute Liters of Oxygen 0 L     5 Minute Oxygen Saturation % 100 %     5 Minute Liters of Oxygen 0 L     6 Minute Oxygen Saturation % 100 %     6 Minute Liters of Oxygen 0 L     2 Minute Post Oxygen Saturation % 100 %     2 Minute Post Liters of Oxygen 0 L            Oxygen Initial Assessment:  Oxygen Initial Assessment - 04/05/20 1449      Home Oxygen   Home Oxygen Device None    Sleep Oxygen Prescription CPAP    Home Exercise Oxygen Prescription None    Home Resting Oxygen Prescription None    Compliance with Home Oxygen Use Yes      Initial 6 min Walk   Oxygen Used None      Program Oxygen Prescription   Program Oxygen Prescription None      Intervention   Short Term Goals To learn and exhibit compliance with exercise, home and travel O2 prescription;To learn and understand importance of monitoring SPO2 with pulse oximeter and demonstrate accurate use of the pulse oximeter.;To learn and understand importance of maintaining oxygen saturations>88%;To learn and demonstrate proper pursed lip breathing techniques or other breathing techniques.;To learn and demonstrate proper use of respiratory medications    Long  Term Goals Exhibits compliance with exercise, home and travel O2  prescription;Verbalizes importance of monitoring SPO2 with pulse oximeter and return demonstration;Maintenance of O2 saturations>88%;Demonstrates proper use of MDIs;Compliance with respiratory medication;Exhibits proper breathing techniques, such as pursed lip breathing or other method taught during program session           Oxygen Re-Evaluation:  Oxygen Re-Evaluation    Row Name 04/18/20 0903 05/15/20 1210 06/05/20 1515         Program Oxygen Prescription   Program Oxygen Prescription None None None       Home Oxygen   Home Oxygen Device None None --     Sleep Oxygen Prescription CPAP CPAP --     Home Exercise Oxygen Prescription None None None     Home Resting Oxygen Prescription None None None     Compliance with Home Oxygen Use Yes Yes Yes       Goals/Expected Outcomes   Short Term Goals To learn and exhibit  compliance with exercise, home and travel O2 prescription;To learn and understand importance of monitoring SPO2 with pulse oximeter and demonstrate accurate use of the pulse oximeter.;To learn and understand importance of maintaining oxygen saturations>88%;To learn and demonstrate proper pursed lip breathing techniques or other breathing techniques.;To learn and demonstrate proper use of respiratory medications To learn and exhibit compliance with exercise, home and travel O2 prescription;To learn and understand importance of monitoring SPO2 with pulse oximeter and demonstrate accurate use of the pulse oximeter.;To learn and understand importance of maintaining oxygen saturations>88%;To learn and demonstrate proper pursed lip breathing techniques or other breathing techniques.;To learn and demonstrate proper use of respiratory medications To learn and demonstrate proper use of respiratory medications;To learn and demonstrate proper pursed lip breathing techniques or other breathing techniques.;To learn and understand importance of maintaining oxygen saturations>88%;To learn and  understand importance of monitoring SPO2 with pulse oximeter and demonstrate accurate use of the pulse oximeter.;To learn and exhibit compliance with exercise, home and travel O2 prescription     Long  Term Goals Exhibits compliance with exercise, home and travel O2 prescription;Verbalizes importance of monitoring SPO2 with pulse oximeter and return demonstration;Maintenance of O2 saturations>88%;Demonstrates proper use of MDIs;Compliance with respiratory medication;Exhibits proper breathing techniques, such as pursed lip breathing or other method taught during program session Exhibits compliance with exercise, home and travel O2 prescription;Verbalizes importance of monitoring SPO2 with pulse oximeter and return demonstration;Maintenance of O2 saturations>88%;Demonstrates proper use of MDIs;Compliance with respiratory medication;Exhibits proper breathing techniques, such as pursed lip breathing or other method taught during program session Exhibits compliance with exercise, home and travel O2 prescription;Verbalizes importance of monitoring SPO2 with pulse oximeter and return demonstration;Maintenance of O2 saturations>88%;Exhibits proper breathing techniques, such as pursed lip breathing or other method taught during program session;Compliance with respiratory medication;Demonstrates proper use of MDIs     Goals/Expected Outcomes compliance compliance Compliance            Oxygen Discharge (Final Oxygen Re-Evaluation):  Oxygen Re-Evaluation - 06/05/20 1515      Program Oxygen Prescription   Program Oxygen Prescription None      Home Oxygen   Home Exercise Oxygen Prescription None    Home Resting Oxygen Prescription None    Compliance with Home Oxygen Use Yes      Goals/Expected Outcomes   Short Term Goals To learn and demonstrate proper use of respiratory medications;To learn and demonstrate proper pursed lip breathing techniques or other breathing techniques.;To learn and understand  importance of maintaining oxygen saturations>88%;To learn and understand importance of monitoring SPO2 with pulse oximeter and demonstrate accurate use of the pulse oximeter.;To learn and exhibit compliance with exercise, home and travel O2 prescription    Long  Term Goals Exhibits compliance with exercise, home and travel O2 prescription;Verbalizes importance of monitoring SPO2 with pulse oximeter and return demonstration;Maintenance of O2 saturations>88%;Exhibits proper breathing techniques, such as pursed lip breathing or other method taught during program session;Compliance with respiratory medication;Demonstrates proper use of MDIs    Goals/Expected Outcomes Compliance           Initial Exercise Prescription:  Initial Exercise Prescription - 04/05/20 1400      Date of Initial Exercise RX and Referring Provider   Date 04/05/20    Referring Provider Dr. Harrington Challenger    Expected Discharge Date 08/09/20      Treadmill   MPH 1.4    Grade 0    Minutes 17      NuStep   Level 1    SPM  80    Minutes 22      Prescription Details   Frequency (times per week) 2    Duration Progress to 30 minutes of continuous aerobic without signs/symptoms of physical distress      Intensity   Ratings of Perceived Exertion 11-13    Perceived Dyspnea 0-4      Resistance Training   Training Prescription Yes    Weight 2 lbs    Reps 10-15           Perform Capillary Blood Glucose checks as needed.  Exercise Prescription Changes:   Exercise Prescription Changes    Row Name 04/24/20 1225 05/08/20 1200 05/15/20 1200 05/17/20 1200 05/29/20 1200     Response to Exercise   Blood Pressure (Admit) 112/66 110/70 120/78 -- 118/84   Blood Pressure (Exercise) 134/64 160/82 144/70 -- 164/76   Blood Pressure (Exit) 100/52 114/60 110/60 -- 100/68   Heart Rate (Admit) 70 bpm 64 bpm 70 bpm -- 68 bpm   Heart Rate (Exercise) 108 bpm 103 bpm 108 bpm -- 109 bpm   Heart Rate (Exit) 82 bpm 73 bpm 72 bpm -- 84 bpm    Oxygen Saturation (Admit) 97 % 99 % 97 % -- 99 %   Oxygen Saturation (Exercise) 97 % 94 % 96 % -- 96 %   Oxygen Saturation (Exit) 98 % 98 % 97 % -- 97 %   Rating of Perceived Exertion (Exercise) '12 10 12 ' -- 10   Perceived Dyspnea (Exercise) '11 10 12 ' -- 10   Duration Continue with 30 min of aerobic exercise without signs/symptoms of physical distress. Continue with 30 min of aerobic exercise without signs/symptoms of physical distress. Continue with 30 min of aerobic exercise without signs/symptoms of physical distress. -- Continue with 30 min of aerobic exercise without signs/symptoms of physical distress.   Intensity THRR unchanged THRR unchanged THRR unchanged -- THRR unchanged     Progression   Progression Continue to progress workloads to maintain intensity without signs/symptoms of physical distress. Continue to progress workloads to maintain intensity without signs/symptoms of physical distress. Continue to progress workloads to maintain intensity without signs/symptoms of physical distress. -- Continue to progress workloads to maintain intensity without signs/symptoms of physical distress.     Resistance Training   Training Prescription Yes Yes Yes -- Yes   Weight 2 lbs 2 lbs 2 lbs -- 2 lbs   Reps 10-15 10-15 10-15 -- 10-15   Time 10 Minutes 10 Minutes 10 Minutes -- 10 Minutes     Treadmill   MPH 2.1 2.2 2.2 -- 2.1   Grade 0 0 0 -- 0   Minutes '17 17 17 ' -- 17   METs 2.61 2.68 2.68 -- 2.61     NuStep   Level '2 3 3 ' -- 3   SPM 91 100 104 -- 122   Minutes '22 22 22 ' -- 22   METs 2.3 2.1 1.9 -- 1.8     Home Exercise Plan   Plans to continue exercise at -- -- -- Home (comment) --   Frequency -- -- -- Add 1 additional day to program exercise sessions. --   Initial Home Exercises Provided -- -- -- 05/17/20 --   [place] Name 06/05/20 1200             Response to Exercise   Blood Pressure (Admit) 120/78       Blood Pressure (Exercise) 162/78       Blood Pressure (Exit) 112/70  Heart Rate (Admit) 63 bpm       Heart Rate (Exercise) 100 bpm       Heart Rate (Exit) 63 bpm       Oxygen Saturation (Admit) 100 %       Oxygen Saturation (Exercise) 97 %       Oxygen Saturation (Exit) 100 %       Rating of Perceived Exertion (Exercise) 14       Perceived Dyspnea (Exercise) 15       Duration Continue with 30 min of aerobic exercise without signs/symptoms of physical distress.       Intensity THRR unchanged         Progression   Progression Continue to progress workloads to maintain intensity without signs/symptoms of physical distress.         Resistance Training   Training Prescription Yes       Weight 2 lbs       Reps 10-15       Time 10 Minutes         Treadmill   MPH 1.8       Grade 0       Minutes 17       METs 2         NuStep   Level 3       SPM 68       Minutes 22       METs 1.8              Exercise Comments:   Exercise Comments    Row Name 04/12/20 1236 05/17/20 1201         Exercise Comments Pt completed her first exercise session in pulmonary rehab. She was able to tolerate the treadmill and the stepper well with no complaints. home exercise reviewed             Exercise Goals and Review:   Exercise Goals    Row Name 04/05/20 1500 04/18/20 0904 05/15/20 1212 06/05/20 1516       Exercise Goals   Increase Physical Activity Yes Yes Yes Yes    Intervention Provide advice, education, support and counseling about physical activity/exercise needs.;Develop an individualized exercise prescription for aerobic and resistive training based on initial evaluation findings, risk stratification, comorbidities and participant's personal goals. Provide advice, education, support and counseling about physical activity/exercise needs.;Develop an individualized exercise prescription for aerobic and resistive training based on initial evaluation findings, risk stratification, comorbidities and participant's personal goals. Provide advice, education,  support and counseling about physical activity/exercise needs.;Develop an individualized exercise prescription for aerobic and resistive training based on initial evaluation findings, risk stratification, comorbidities and participant's personal goals. Provide advice, education, support and counseling about physical activity/exercise needs.;Develop an individualized exercise prescription for aerobic and resistive training based on initial evaluation findings, risk stratification, comorbidities and participant's personal goals.    Expected Outcomes Short Term: Attend rehab on a regular basis to increase amount of physical activity.;Long Term: Add in home exercise to make exercise part of routine and to increase amount of physical activity.;Long Term: Exercising regularly at least 3-5 days a week. Short Term: Attend rehab on a regular basis to increase amount of physical activity.;Long Term: Add in home exercise to make exercise part of routine and to increase amount of physical activity.;Long Term: Exercising regularly at least 3-5 days a week. Short Term: Attend rehab on a regular basis to increase amount of physical activity.;Long Term: Add in home exercise to make  exercise part of routine and to increase amount of physical activity.;Long Term: Exercising regularly at least 3-5 days a week. Short Term: Attend rehab on a regular basis to increase amount of physical activity.;Long Term: Add in home exercise to make exercise part of routine and to increase amount of physical activity.;Long Term: Exercising regularly at least 3-5 days a week.    Increase Strength and Stamina Yes Yes Yes Yes    Intervention Provide advice, education, support and counseling about physical activity/exercise needs.;Develop an individualized exercise prescription for aerobic and resistive training based on initial evaluation findings, risk stratification, comorbidities and participant's personal goals. Provide advice, education, support  and counseling about physical activity/exercise needs.;Develop an individualized exercise prescription for aerobic and resistive training based on initial evaluation findings, risk stratification, comorbidities and participant's personal goals. Provide advice, education, support and counseling about physical activity/exercise needs.;Develop an individualized exercise prescription for aerobic and resistive training based on initial evaluation findings, risk stratification, comorbidities and participant's personal goals. Provide advice, education, support and counseling about physical activity/exercise needs.;Develop an individualized exercise prescription for aerobic and resistive training based on initial evaluation findings, risk stratification, comorbidities and participant's personal goals.    Expected Outcomes Short Term: Increase workloads from initial exercise prescription for resistance, speed, and METs.;Short Term: Perform resistance training exercises routinely during rehab and add in resistance training at home;Long Term: Improve cardiorespiratory fitness, muscular endurance and strength as measured by increased METs and functional capacity (6MWT) Short Term: Increase workloads from initial exercise prescription for resistance, speed, and METs.;Short Term: Perform resistance training exercises routinely during rehab and add in resistance training at home;Long Term: Improve cardiorespiratory fitness, muscular endurance and strength as measured by increased METs and functional capacity (6MWT) Short Term: Increase workloads from initial exercise prescription for resistance, speed, and METs.;Short Term: Perform resistance training exercises routinely during rehab and add in resistance training at home;Long Term: Improve cardiorespiratory fitness, muscular endurance and strength as measured by increased METs and functional capacity (6MWT) Short Term: Increase workloads from initial exercise prescription for  resistance, speed, and METs.;Short Term: Perform resistance training exercises routinely during rehab and add in resistance training at home;Long Term: Improve cardiorespiratory fitness, muscular endurance and strength as measured by increased METs and functional capacity (6MWT)    Able to understand and use rate of perceived exertion (RPE) scale Yes Yes Yes Yes    Intervention Provide education and explanation on how to use RPE scale Provide education and explanation on how to use RPE scale Provide education and explanation on how to use RPE scale Provide education and explanation on how to use RPE scale    Expected Outcomes Short Term: Able to use RPE daily in rehab to express subjective intensity level;Long Term:  Able to use RPE to guide intensity level when exercising independently Short Term: Able to use RPE daily in rehab to express subjective intensity level;Long Term:  Able to use RPE to guide intensity level when exercising independently Short Term: Able to use RPE daily in rehab to express subjective intensity level;Long Term:  Able to use RPE to guide intensity level when exercising independently Short Term: Able to use RPE daily in rehab to express subjective intensity level;Long Term:  Able to use RPE to guide intensity level when exercising independently    Able to understand and use Dyspnea scale Yes Yes Yes Yes    Intervention Provide education and explanation on how to use Dyspnea scale Provide education and explanation on how to use Dyspnea scale  Provide education and explanation on how to use Dyspnea scale Provide education and explanation on how to use Dyspnea scale    Expected Outcomes Short Term: Able to use Dyspnea scale daily in rehab to express subjective sense of shortness of breath during exertion;Long Term: Able to use Dyspnea scale to guide intensity level when exercising independently Short Term: Able to use Dyspnea scale daily in rehab to express subjective sense of shortness of  breath during exertion;Long Term: Able to use Dyspnea scale to guide intensity level when exercising independently Short Term: Able to use Dyspnea scale daily in rehab to express subjective sense of shortness of breath during exertion;Long Term: Able to use Dyspnea scale to guide intensity level when exercising independently Short Term: Able to use Dyspnea scale daily in rehab to express subjective sense of shortness of breath during exertion;Long Term: Able to use Dyspnea scale to guide intensity level when exercising independently    Knowledge and understanding of Target Heart Rate Range (THRR) Yes Yes Yes Yes    Intervention Provide education and explanation of THRR including how the numbers were predicted and where they are located for reference Provide education and explanation of THRR including how the numbers were predicted and where they are located for reference Provide education and explanation of THRR including how the numbers were predicted and where they are located for reference Provide education and explanation of THRR including how the numbers were predicted and where they are located for reference    Expected Outcomes Short Term: Able to state/look up THRR;Long Term: Able to use THRR to govern intensity when exercising independently;Short Term: Able to use daily as guideline for intensity in rehab Short Term: Able to state/look up THRR;Long Term: Able to use THRR to govern intensity when exercising independently;Short Term: Able to use daily as guideline for intensity in rehab Short Term: Able to state/look up THRR;Long Term: Able to use THRR to govern intensity when exercising independently;Short Term: Able to use daily as guideline for intensity in rehab Short Term: Able to state/look up THRR;Long Term: Able to use THRR to govern intensity when exercising independently;Short Term: Able to use daily as guideline for intensity in rehab    Understanding of Exercise Prescription Yes Yes Yes Yes     Intervention Provide education, explanation, and written materials on patient's individual exercise prescription Provide education, explanation, and written materials on patient's individual exercise prescription Provide education, explanation, and written materials on patient's individual exercise prescription Provide education, explanation, and written materials on patient's individual exercise prescription    Expected Outcomes Short Term: Able to explain program exercise prescription;Long Term: Able to explain home exercise prescription to exercise independently Short Term: Able to explain program exercise prescription;Long Term: Able to explain home exercise prescription to exercise independently Short Term: Able to explain program exercise prescription;Long Term: Able to explain home exercise prescription to exercise independently Short Term: Able to explain program exercise prescription;Long Term: Able to explain home exercise prescription to exercise independently           Exercise Goals Re-Evaluation :  Exercise Goals Re-Evaluation    Row Name 04/18/20 0904 05/15/20 1213 06/05/20 1517         Exercise Goal Re-Evaluation   Exercise Goals Review Increase Physical Activity;Able to understand and use rate of perceived exertion (RPE) scale;Increase Strength and Stamina;Able to understand and use Dyspnea scale;Knowledge and understanding of Target Heart Rate Range (THRR);Understanding of Exercise Prescription Increase Physical Activity;Able to understand and use rate of perceived  exertion (RPE) scale;Increase Strength and Stamina;Able to understand and use Dyspnea scale;Knowledge and understanding of Target Heart Rate Range (THRR);Understanding of Exercise Prescription Increase Physical Activity;Increase Strength and Stamina;Able to understand and use rate of perceived exertion (RPE) scale;Able to understand and use Dyspnea scale;Knowledge and understanding of Target Heart Rate Range  (THRR);Understanding of Exercise Prescription     Comments Pt has attended 2 exercise sessions. She has a positive attitude and will likely progress quickly. She currently exercises at 2.1 METs on the stepper. Will continue to monitor and progress as able. Pt has attended 8 exercise sessions. She remains positive and is progressing, but she has a lingering shoulder injury that is holding her back from her full potential. She currently exercises at 1.9 METs on the stepper. Will continue to monitor and progress as able. Pt has completed 13 exercise sessions. Pt is tolerating and progressing well. She has experienced a little more fatigue and exhaustion the last few exercise sessions. She is exercising at 1.8 MET's on the stepper. Will continue to progress exercise as able.     Expected Outcomes Through exercise at rehab and by engaging in a home exercise program, the pt will reach their goals. Through exercise at rehab and by engaging in a home exercise program, the pt will reach their goals. Through coming to rehab and exercising at home, patient will reach their goals.            Discharge Exercise Prescription (Final Exercise Prescription Changes):  Exercise Prescription Changes - 06/05/20 1200      Response to Exercise   Blood Pressure (Admit) 120/78    Blood Pressure (Exercise) 162/78    Blood Pressure (Exit) 112/70    Heart Rate (Admit) 63 bpm    Heart Rate (Exercise) 100 bpm    Heart Rate (Exit) 63 bpm    Oxygen Saturation (Admit) 100 %    Oxygen Saturation (Exercise) 97 %    Oxygen Saturation (Exit) 100 %    Rating of Perceived Exertion (Exercise) 14    Perceived Dyspnea (Exercise) 15    Duration Continue with 30 min of aerobic exercise without signs/symptoms of physical distress.    Intensity THRR unchanged      Progression   Progression Continue to progress workloads to maintain intensity without signs/symptoms of physical distress.      Resistance Training   Training  Prescription Yes    Weight 2 lbs    Reps 10-15    Time 10 Minutes      Treadmill   MPH 1.8    Grade 0    Minutes 17    METs 2      NuStep   Level 3    SPM 68    Minutes 22    METs 1.8           Nutrition:  Target Goals: Understanding of nutrition guidelines, daily intake of sodium <1574m, cholesterol <2032m calories 30% from fat and 7% or less from saturated fats, daily to have 5 or more servings of fruits and vegetables.  Biometrics:  Pre Biometrics - 06/05/20 1510      Pre Biometrics   Weight 145.8 kg            Nutrition Therapy Plan and Nutrition Goals:  Nutrition Therapy & Goals - 06/04/20 1243      Personal Nutrition Goals   Comments Continue to provide education through hand-outs regarding nutrition and healthier choices.      Intervention Plan  Intervention Nutrition handout(s) given to patient.           Nutrition Assessments:  Nutrition Assessments - 04/05/20 1503      MEDFICTS Scores   Pre Score 36          MEDIFICTS Score Key:  ?70 Need to make dietary changes   40-70 Heart Healthy Diet  ? 40 Therapeutic Level Cholesterol Diet   Picture Your Plate Scores:  <45 Unhealthy dietary pattern with much room for improvement.  41-50 Dietary pattern unlikely to meet recommendations for good health and room for improvement.  51-60 More healthful dietary pattern, with some room for improvement.   >60 Healthy dietary pattern, although there may be some specific behaviors that could be improved.    Nutrition Goals Re-Evaluation:   Nutrition Goals Discharge (Final Nutrition Goals Re-Evaluation):   Psychosocial: Target Goals: Acknowledge presence or absence of significant depression and/or stress, maximize coping skills, provide positive support system. Participant is able to verbalize types and ability to use techniques and skills needed for reducing stress and depression.  Initial Review & Psychosocial Screening:  Initial Psych  Review & Screening - 04/05/20 1511      Initial Review   Current issues with None Identified      Family Dynamics   Good Support System? Yes    Comments Patient lives with her husband. She has 4 children and 9 grandchildren. She says she is very involved in their lives and has a great relationship with them. 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 She denies any depression or anxiety. She says she has always had low self-esteem and continues to struggle with this. Will continue to monitor.      Barriers   Psychosocial barriers to participate in program Psychosocial barriers identified (see note)      Screening Interventions   Interventions Encouraged to exercise;Provide feedback about the scores to participant           Quality of Life Scores:  Quality of Life - 04/05/20 1514      Quality of Life   Select Quality of Life      Quality of Life Scores   Health/Function Pre 19.5 %    Socioeconomic Pre 23.71 %    Psych/Spiritual Pre 20.64 %    Family Pre 24 %    GLOBAL Pre 21.21 %          Scores of 19 and below usually indicate a poorer quality of life in these areas.  A difference of  2-3 points is a clinically meaningful difference.  A difference of 2-3 points in the total score of the Quality of Life Index has been associated with significant improvement in overall quality of life, self-image, physical symptoms, and general health in studies assessing change in quality of life.   PHQ-9: Recent Review Flowsheet Data    Depression screen [facility] 2/9 04/05/2020   Decreased Interest 0   Down, Depressed, Hopeless 0   PHQ - 2 Score 0   Altered sleeping 3   Tired, decreased energy 3   Change in appetite 2   Feeling bad or failure about yourself  2   Trouble concentrating 0   Moving slowly or fidgety/restless 0   Suicidal thoughts 0   PHQ-9 Score 10   Difficult doing work/chores Not difficult at all      Interpretation of Total Score  Total Score Depression Severity:  1-4 =  Minimal depression, 5-9 = Mild depression, 10-14 = Moderate depression, 15-19 = Moderately severe depression, 20-27 = Severe depression   Psychosocial Evaluation and Intervention:  Psychosocial Evaluation - 04/05/20 1515      Psychosocial Evaluation & Interventions   Interventions Stress management education;Relaxation education;Encouraged to exercise with the program and follow exercise prescription    Comments Patient's initial QOL score was 21.21 and her PHQ-9 score was 10 with no psychosocial issues identified at her orientation visit. She says she has struggled with a low self-esteem her whole life but has a positive outlook. Will continue to monitor.    Expected Outcomes Patient will have no psychosocial issues identified at discharge.    Continue Psychosocial Services  No Follow up required           Psychosocial Re-Evaluation:  Psychosocial Re-Evaluation    [place] Name 04/19/20 0753 05/14/20 1411 06/04/20 1243         Psychosocial Re-Evaluation   Current issues with None Identified None Identified None Identified     Comments Patient continues to have no psychosocial issues identified. Will continue to monitor. Patient continues to have no psychosocial issues identified. Will continue to monitor. Patient continues to have no psychosocial issues identified. Patient work very hard during her sessions. She says she enjoys getting out and being in an exercise routine. 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 She is very interactive with others in her class and with staff. Will continue to monitor.     Expected Outcomes Patient will have no psychosocial issues identifed at discharge. Patient will have no psychosocial issues identifed at discharge. Patient will have no psychosocial issues identifed at discharge.     Interventions Relaxation education;Stress management  education;Encouraged to attend Pulmonary Rehabilitation for the exercise Relaxation education;Stress management education;Encouraged to attend Pulmonary Rehabilitation for the exercise Relaxation education;Stress management education;Encouraged to attend Pulmonary Rehabilitation for the exercise     Continue Psychosocial Services  No Follow up required No Follow up required No Follow up required            Psychosocial Discharge (Final Psychosocial Re-Evaluation):  Psychosocial Re-Evaluation - 06/04/20 1243      Psychosocial Re-Evaluation   Current issues with None Identified    Comments Patient continues to have no psychosocial issues identified. Patient work very hard during her sessions. She says she enjoys getting out and being in an exercise routine. 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 She is very interactive with others in her class and with staff. Will continue to monitor.    Expected Outcomes Patient will have no psychosocial issues identifed at discharge.    Interventions Relaxation education;Stress management education;Encouraged to attend Pulmonary Rehabilitation for the exercise    Continue Psychosocial Services  No Follow up required            Education: Education Goals: Education classes will be provided on a weekly basis, covering required topics. Participant will state understanding/return demonstration of topics presented.  Learning Barriers/Preferences:  Learning Barriers/Preferences - 04/05/20 1503      Learning Barriers/Preferences   Learning Barriers None    Learning Preferences Skilled Demonstration           Education Topics: How Lungs Work and Diseases: - Discuss the anatomy of the lungs and diseases that can affect the lungs, such as COPD.   Exercise: -Discuss the importance of exercise, FITT principles of exercise, normal  and abnormal responses to exercise, and how to exercise safely.   Environmental  Irritants: -Discuss types of environmental irritants and how to limit exposure to environmental irritants.   PULMONARY REHAB OTHER RESPIRATORY from 05/24/2020 in [place]  Date 05/03/20  Educator Etheleen Mayhew  Instruction Review Code 1- Verbalizes Understanding      Meds/Inhalers and oxygen: - Discuss respiratory medications, definition of an inhaler and oxygen, and the proper way to use an inhaler and oxygen.   Energy Saving Techniques: - Discuss methods to conserve energy and decrease shortness of breath when performing activities of daily living.    PULMONARY REHAB OTHER RESPIRATORY from 05/24/2020 in [place]  Date 05/17/20  Educator DF  Instruction Review Code 1- Verbalizes Understanding      Bronchial Hygiene / Breathing Techniques: - Discuss breathing mechanics, pursed-lip breathing technique,  proper posture, effective ways to clear airways, and other functional breathing techniques   PULMONARY REHAB OTHER RESPIRATORY from 05/24/2020 in [place]  Date 05/24/20  Educator DF  Instruction Review Code 2- Demonstrated Understanding      Cleaning Equipment: - Provides group verbal and written instruction about the health risks of elevated stress, cause of high stress, and healthy ways to reduce stress.   Nutrition I: Fats: - Discuss the types of cholesterol, what cholesterol does to the body, and how cholesterol levels can be controlled.   Nutrition II: Labels: -Discuss the different components of food labels and how to read food labels.   Respiratory Infections: - Discuss the signs and symptoms of respiratory infections, ways to prevent respiratory infections, and the importance of seeking medical treatment when having a respiratory infection.   Stress I: Signs and Symptoms: - Discuss the causes of stress, how stress may lead to anxiety and depression, and ways to limit stress.   Stress II:  Relaxation: -Discuss relaxation techniques to limit stress.   Oxygen for Home/Travel: - Discuss how to prepare for travel when on oxygen and proper ways to transport and store oxygen to ensure safety.   Knowledge Questionnaire Score:  Knowledge Questionnaire Score - 04/05/20 1503      Knowledge Questionnaire Score   Pre Score 15/18           Core Components/Risk Factors/Patient Goals at Admission:  Personal Goals and Risk Factors at Admission - 04/05/20 1510      Core Components/Risk Factors/Patient Goals on Admission    Weight Management Obesity    Personal Goal Other Yes    Personal Goal Be able to do her ADL's without getting SOB.    Intervention Patient will attend PR 2 days/week and supplement with exercise at home 3 days/week.    Expected Outcomes Patient will complete the program meeting her personal and program goals.           Core Components/Risk Factors/Patient Goals Review:   Goals and Risk Factor Review    Row Name 04/19/20 0748 05/14/20 1411 06/04/20 1245         Core Components/Risk Factors/Patient Goals Review   Personal Goals Review Weight Management/Obesity;Other Weight Management/Obesity;Other --     Review Patient is new to the program.  She has completed 3 sessions without difficulty. Will continue to montior as she works toward meeting her personal and program goals. Patient has completed 7 sessions gaining 3 lbs since her initial visit. She is doing well in pulmonary rehab with progression and consistent attendance. Her personal goals are to be able to  do her ADL's without getting SOB. She pushes herself during the sessions and feels she is making progress toward meeting her goals. Will continue to monitor. Patient has completed 12 sessions maintaining her weight since last 30 day review. She continues to do very well with progression and consistent attendance. She is highly motivated and works hard. Her personal goals are to be able to do her ADL's  without getting SOB. She says this is improving and she feels stronger overall. Will continue to monitor for progress.     Expected Outcomes Patient will complete the program meeting her personal and program goals. Patient will complete the program meeting her personal and program goals. Patient will complete the program meeting her personal and program goals.            Core Components/Risk Factors/Patient Goals at Discharge (Final Review):   Goals and Risk Factor Review - 06/04/20 1245      Core Components/Risk Factors/Patient Goals Review   Review Patient has completed 12 sessions maintaining her weight since last 30 day review. She continues to do very well with progression and consistent attendance. She is highly motivated and works hard. Her personal goals are to be able to do her ADL's without getting SOB. She says this is improving and she feels stronger overall. Will continue to monitor for progress.    Expected Outcomes Patient will complete the program meeting her personal and program goals.           ITP Comments:   Comments: ITP REVIEW Pt is making expected progress toward pulmonary rehab goals after completing 14 sessions. Recommend continued exercise, life style modification, education, and utilization of breathing techniques to increase stamina and strength and decrease shortness of breath with exertion.

## 2020-06-07 NOTE — Patient Instructions (Addendum)
No need any blood pressure medication for now - if do need a blood pressure there are plenty of other options for you that don't include acei inhitors or ARB(olmesartan)     To get the most out of exercise, you need to be continuously aware that you are short of breath, but never out of breath, for 30 minutes daily. As you improve, it will actually be easier for you to do the same amount of exercise  in  30 minutes so always push to the level where you are short of breath.      I very strongly recommend you get the moderna or pfizer vaccine as soon as possible based on your risk of dying from the virus  and the proven safety and benefit of these vaccines against even the delta variant.  This can save your life as well as  those of your loved ones,  especially if they are also not vaccinated.     Please remember to go to the lab department @ [facility] for your tests - we will call you with the results when they are available.        If you are satisfied with your treatment plan,  let your doctor know and he/she can either refill your medications or you can return here when your prescription runs out.     If in any way you are not 100% satisfied,  please tell us.  If 100% better, tell your friends!  Pulmonary follow up is as needed

## 2020-06-08 ENCOUNTER — Encounter: Payer: Self-pay | Admitting: Internal Medicine

## 2020-06-08 LAB — RHEUMATOID FACTOR: Rheumatoid fact SerPl-aCnc: <10 IU/mL (ref 0.0–13.9)

## 2020-06-08 LAB — ANA: Anti Nuclear Antibody (ANA): NEGATIVE

## 2020-06-08 NOTE — Assessment & Plan Note (Signed)
Body mass index is 47.74 kg/m.  -  trending down/ encouraged  No results found for: TSH   Contributing to gerd risk/ doe/reviewed the need and the process to achieve and maintain neg calorie balance > defer f/u primary care including intermittently monitoring thyroid status

## 2020-06-08 NOTE — Assessment & Plan Note (Signed)
Try off acei 04/24/2020 due to unexplained sob s prominent cough > improved but could not tol ARB  Although even in retrospect it may not be clear the ACEi contributed to the pt's symptoms,  Pt's resp symptoms seemed to  Improve off them and adding them back at this point or in the future would risk confusion in interpretation of non-specific respiratory symptoms to which this patient is prone  ie  Better not to muddy the waters here.   >>> for now controlled bp just with lasix > referred back to PCP for f/u

## 2020-06-08 NOTE — Assessment & Plan Note (Signed)
Onset sept 2020 - pfts 05/11/2019  wnl x very low ERV - echo 09/40/76   C/w diastolic dysfunction  - started Cardiopulmonary rehab 04/05/20 -  04/24/2020   Walked RA  approx   300 ft  @ fast pace  stopped due to min sob/ sats 89%    - Trial off ACEi 04/24/2020 >>>  Doe improved with nl sats per pt but developed polyarthris  Symptoms now suggestive of some form of collagen vasc process which I doubt was triggered by ARB but fine to leave it off for now and pursue w/u  >  Pending

## 2020-06-08 NOTE — Assessment & Plan Note (Signed)
Apparently she's had similar symptoms in past and already taking max doses of mobic   Initial ESR unimpressive and hesitant to offer prednisone trial given wt issues so rec collagen vasc profile and refer back to PCP ? Needs rheum next and keep in mind assoc of collagen vasc dz with ILD but see no evidence for that yet.   Pulmonary f/u is prn          Each maintenance medication was reviewed in detail including emphasizing most importantly the difference between maintenance and prns and under what circumstances the prns are to be triggered using an action plan format where appropriate.  Total time for H and P, chart review, counseling,   and generating customized AVS unique to this office visit / charting = 31 min

## 2020-06-12 ENCOUNTER — Encounter (HOSPITAL_COMMUNITY)
Admission: RE | Admit: 2020-06-12 | Discharge: 2020-06-12 | Disposition: A | Discharge status: Home / Self Care | Payer: 59 | Source: Ambulatory Visit | Attending: Internal Medicine | Admitting: Internal Medicine

## 2020-06-12 ENCOUNTER — Other Ambulatory Visit: Payer: Self-pay

## 2020-06-12 VITALS — Wt 324.3 lb

## 2020-06-12 DIAGNOSIS — R0602 Shortness of breath: Secondary | ICD-10-CM | POA: Diagnosis not present

## 2020-06-12 NOTE — Progress Notes (Signed)
Daily Session Note  Patient Details  Name: Meagan Reyes MRN: [id number] Date of Birth: 1956-06-12 Referring Provider:     PULMONARY REHAB OTHER RESP ORIENTATION from 04/05/2020 in [place]  Referring Provider Dr. Harrington Challenger      Encounter Date: 06/12/2020  Check In:  Session Check In - 06/12/20 1045      Check-In   Supervising physician immediately available to respond to emergencies [facility] MD immediately available    Physician(s) Dr. Johnsie Cancel    Location [facility]    Staff Present Cathren Harsh, MS, Exercise Physiologist;Jennell Janosik Kris Mouton, MS, ACSM-CEP, Exercise Physiologist;Phyllis Billingsley, RN    Virtual Visit No    Medication changes reported     No    Fall or balance concerns reported    No    Tobacco Cessation No Change    Warm-up and Cool-down Performed as group-led instruction    Resistance Training Performed Yes    VAD Patient? No    PAD/SET Patient? No      Pain Assessment   Currently in Pain? Yes    Pain Score 5     Pain Location Shoulder    Pain Orientation Right    Pain Descriptors / Indicators Constant    Pain Type Chronic pain    Pain Onset More than a month ago    Pain Frequency Constant    Multiple Pain Sites No           Capillary Blood Glucose: No results found for this or any previous visit (from the past 24 hour(s)).    Social History   Tobacco Use  Smoking Status Former Smoker  . Packs/day: 0.10  . Years: 0.25  . Pack years: 0.02  . Start date: 95  . Quit date: 88  . Years since quitting: 45.9  Smokeless Tobacco Never Used  Tobacco Comment   a couple months when 19 then stopped    Goals Met:  Independence with exercise equipment Exercise tolerated well No report of cardiac concerns or symptoms Strength training completed today  Goals Unmet:  Not Applicable  Comments: checkout time is 1145   Dr. Kathie Dike is Medical Director for [facility].

## 2020-06-14 ENCOUNTER — Encounter (HOSPITAL_COMMUNITY): Payer: 59

## 2020-06-19 ENCOUNTER — Encounter (HOSPITAL_COMMUNITY): Payer: 59

## 2020-06-21 ENCOUNTER — Encounter (HOSPITAL_COMMUNITY): Payer: 59

## 2020-06-26 ENCOUNTER — Encounter (HOSPITAL_COMMUNITY): Payer: 59

## 2020-06-28 ENCOUNTER — Encounter (HOSPITAL_COMMUNITY): Payer: 59

## 2020-06-28 ENCOUNTER — Ambulatory Visit: Payer: 59 | Admitting: Gastroenterology

## 2020-07-02 NOTE — Addendum Note (Signed)
Encounter addended by: Dwana Melena, RN on: 07/02/2020 1:01 PM  Actions taken: Flowsheet data copied forward, Flowsheet accepted

## 2020-07-03 ENCOUNTER — Encounter (HOSPITAL_COMMUNITY): Payer: 59

## 2020-07-05 ENCOUNTER — Encounter (HOSPITAL_COMMUNITY): Payer: 59

## 2020-07-05 NOTE — Progress Notes (Signed)
Pulmonary Individual Treatment Plan  Patient Details  Name: Meagan Reyes MRN: [id number] Date of Birth: [birth year] Referring Provider:   [facility] from 04/05/2020 in [place]  Referring Provider Dr. Harrington Challenger      Initial Encounter Date:  Flowsheet Row PULMONARY REHAB OTHER RESP ORIENTATION from 04/05/2020 in [place]  Date 04/05/20      Visit Diagnosis: SOB (shortness of breath)  Patient's Home Medications on Admission:   Current Outpatient Medications:  .  aspirin 81 MG chewable tablet, Chew 81 mg by mouth at bedtime. , Disp: , Rfl:  .  CALCIUM PO, Take 1 tablet by mouth daily., Disp: , Rfl:  .  cetirizine (ZYRTEC) 10 MG tablet, Take 1 tablet (10 mg total) by mouth daily., Disp: 60 tablet, Rfl: 5 .  Cholecalciferol (VITAMIN D3) 25 MCG (1000 UT) CAPS, Take 2,000 Units by mouth 2 (two) times daily., Disp: , Rfl:  .  famotidine (PEPCID) 20 MG tablet, Take 20 mg by mouth 2 (two) times daily. , Disp: , Rfl:  .  furosemide (LASIX) 40 MG tablet, Take 40 mg by mouth daily. , Disp: , Rfl: 0 .  hydrOXYzine HCl (ATARAX PO), Take by mouth as needed. , Disp: , Rfl:  .  MAGNESIUM PO, Take 1 tablet by mouth daily., Disp: , Rfl:  .  meloxicam (MOBIC) 15 MG tablet, Take 1 tablet by mouth once daily, Disp: 90 tablet, Rfl: 0 .  Omega-3 Fatty Acids (FISH OIL) 1000 MG CAPS, Take 2,000 capsules by mouth daily. , Disp: , Rfl:  .  pantoprazole (PROTONIX) 40 MG tablet, Take 40 mg by mouth daily., Disp: , Rfl:  .  potassium chloride (KLOR-CON) 10 MEQ tablet, Take 1 tablet (10 mEq total) by mouth daily., Disp: 90 tablet, Rfl: 3 .  SUPER B COMPLEX/C PO, Take by mouth., Disp: , Rfl:  .  triamcinolone cream (KENALOG) 0.1 %, Apply 1 application topically 2 (two) times daily., Disp: , Rfl:  .  Turmeric 500 MG CAPS, Take by mouth., Disp: , Rfl:  .  UNABLE TO FIND, Take 2 tablets by mouth every morning. Med Name: Moringa  Oleifera , Disp: , Rfl:  .  vitamin C (ASCORBIC ACID) 500 MG tablet, Take 500 mg by mouth daily., Disp: , Rfl:  .  Vitamins-Lipotropics (MEGA-CHOL PO), Take 4 capsules by mouth 2 (two) times daily with a meal. Takes Doterra EO mega fish oil, Disp: , Rfl:  .  Zinc 15 MG CAPS, Take 15 mg by mouth daily., Disp: , Rfl:   Current Facility-Administered Medications:  .  nortriptyline (PAMELOR) capsule 10 mg, 10 mg, Oral, QHS, [name], MD  Past Medical History: Past Medical History:  Diagnosis Date  . Chronic diastolic heart failure ([place])   . Hyperlipemia   . Hypertension     Tobacco Use: Social History   Tobacco Use  Smoking Status Former Smoker  . Packs/day: 0.10  . Years: 0.25  . Pack years: 0.02  . Start date: 57  . Quit date: 55  . Years since quitting: 45.9  Smokeless Tobacco Never Used  Tobacco Comment   a couple months when 12 then stopped    Labs: Recent Review Flowsheet Data    Labs for ITP Cardiac and Pulmonary Rehab Latest Ref Rng & Units 06/03/2019 08/17/2019   Cholestrol 100 - 199 mg/dL 219(H) 217(H)   LDLCALC 0 - 99 mg/dL 120(H) 130(H)   HDL >39 mg/dL  53 55   Trlycerides 0 - 149 mg/dL 261(H) 183(H)      Capillary Blood Glucose: No results found for: GLUCAP   Pulmonary Assessment Scores:  Pulmonary Assessment Scores    Row Name 04/05/20 1450         ADL UCSD   ADL Phase Entry     SOB Score total 70     Rest 1     Walk 3     Stairs 4     Bath 3     Dress 3     Shop 5           CAT Score   CAT Score 15           mMRC Score   mMRC Score 3           UCSD: Self-administered rating of dyspnea associated with activities of daily living (ADLs) 6-point scale (0 = "not at all" to 5 = "maximal or unable to do because of breathlessness")  Scoring Scores range from 0 to 120.  Minimally important difference is 5 units  CAT: CAT can identify the health impairment of COPD patients and is better correlated with disease progression.   CAT has a scoring range of zero to 40. The CAT score is classified into four groups of low (less than 10), medium (10 - 20), high (21-30) and very high (31-40) based on the impact level of disease on health status. A CAT score over 10 suggests significant symptoms.  A worsening CAT score could be explained by an exacerbation, poor medication adherence, poor inhaler technique, or progression of COPD or comorbid conditions.  CAT MCID is 2 points  mMRC: mMRC (Modified Medical Research Council) Dyspnea Scale is used to assess the degree of baseline functional disability in patients of respiratory disease due to dyspnea. No minimal important difference is established. A decrease in score of 1 point or greater is considered a positive change.   Pulmonary Function Assessment:   Exercise Target Goals: Exercise Program Goal: Individual exercise prescription set using results from initial 6 min walk test and THRR while considering  patient's activity barriers and safety.   Exercise Prescription Goal: Initial exercise prescription builds to 30-45 minutes a day of aerobic activity, 2-3 days per week.  Home exercise guidelines will be given to patient during program as part of exercise prescription that the participant will acknowledge.  Activity Barriers & Risk Stratification:   6 Minute Walk:  6 Minute Walk    Row Name 04/05/20 1450         6 Minute Walk   Phase Initial     Distance 1000 feet     Walk Time 6 minutes     # of Rest Breaks 0     MPH 1.9     METS 1.68     RPE 5.89     Perceived Dyspnea  13     VO2 Peak 9     Symptoms No     Resting HR 62 bpm     Resting BP 126/88     Resting Oxygen Saturation  97 %     Exercise Oxygen Saturation  during 6 min walk 94 %     Max Ex. HR 107 bpm     Max Ex. BP 136/70     2 Minute Post BP 124/70           Interval HR   1 Minute HR 95     2  Minute HR 107     3 Minute HR 102     4 Minute HR 95     5 Minute HR 96     6 Minute HR 96      2 Minute Post HR 61     Interval Heart Rate? Yes           Interval Oxygen   Interval Oxygen? Yes     Baseline Oxygen Saturation % 97 %     1 Minute Oxygen Saturation % 95 %     1 Minute Liters of Oxygen 0 L     2 Minute Oxygen Saturation % 95 %     2 Minute Liters of Oxygen 0 L     3 Minute Oxygen Saturation % 94 %     3 Minute Liters of Oxygen 0 L     4 Minute Oxygen Saturation % 99 %     4 Minute Liters of Oxygen 0 L     5 Minute Oxygen Saturation % 100 %     5 Minute Liters of Oxygen 0 L     6 Minute Oxygen Saturation % 100 %     6 Minute Liters of Oxygen 0 L     2 Minute Post Oxygen Saturation % 100 %     2 Minute Post Liters of Oxygen 0 L            Oxygen Initial Assessment:  Oxygen Initial Assessment - 04/05/20 1449      Home Oxygen   Home Oxygen Device None    Sleep Oxygen Prescription CPAP    Home Exercise Oxygen Prescription None    Home Resting Oxygen Prescription None    Compliance with Home Oxygen Use Yes      Initial 6 min Walk   Oxygen Used None      Program Oxygen Prescription   Program Oxygen Prescription None      Intervention   Short Term Goals To learn and exhibit compliance with exercise, home and travel O2 prescription;To learn and understand importance of monitoring SPO2 with pulse oximeter and demonstrate accurate use of the pulse oximeter.;To learn and understand importance of maintaining oxygen saturations>88%;To learn and demonstrate proper pursed lip breathing techniques or other breathing techniques.;To learn and demonstrate proper use of respiratory medications    Long  Term Goals Exhibits compliance with exercise, home and travel O2 prescription;Verbalizes importance of monitoring SPO2 with pulse oximeter and return demonstration;Maintenance of O2 saturations>88%;Demonstrates proper use of MDI's;Compliance with respiratory medication;Exhibits proper breathing techniques, such as pursed lip breathing or other method taught during program  session           Oxygen Re-Evaluation:  Oxygen Re-Evaluation    Row Name 04/18/20 0903 05/15/20 1210 06/05/20 1515         Program Oxygen Prescription   Program Oxygen Prescription None None None           Home Oxygen   Home Oxygen Device None None --     Sleep Oxygen Prescription CPAP CPAP --     Home Exercise Oxygen Prescription None None None     Home Resting Oxygen Prescription None None None     Compliance with Home Oxygen Use Yes Yes Yes           Goals/Expected Outcomes   Short Term Goals To learn and exhibit compliance with exercise, home and travel O2 prescription;To learn and understand importance of monitoring SPO2 with pulse oximeter and  demonstrate accurate use of the pulse oximeter.;To learn and understand importance of maintaining oxygen saturations>88%;To learn and demonstrate proper pursed lip breathing techniques or other breathing techniques.;To learn and demonstrate proper use of respiratory medications To learn and exhibit compliance with exercise, home and travel O2 prescription;To learn and understand importance of monitoring SPO2 with pulse oximeter and demonstrate accurate use of the pulse oximeter.;To learn and understand importance of maintaining oxygen saturations>88%;To learn and demonstrate proper pursed lip breathing techniques or other breathing techniques.;To learn and demonstrate proper use of respiratory medications To learn and demonstrate proper use of respiratory medications;To learn and demonstrate proper pursed lip breathing techniques or other breathing techniques.;To learn and understand importance of maintaining oxygen saturations>88%;To learn and understand importance of monitoring SPO2 with pulse oximeter and demonstrate accurate use of the pulse oximeter.;To learn and exhibit compliance with exercise, home and travel O2 prescription     Long  Term Goals Exhibits compliance with exercise, home and travel O2 prescription;Verbalizes importance  of monitoring SPO2 with pulse oximeter and return demonstration;Maintenance of O2 saturations>88%;Demonstrates proper use of MDI's;Compliance with respiratory medication;Exhibits proper breathing techniques, such as pursed lip breathing or other method taught during program session Exhibits compliance with exercise, home and travel O2 prescription;Verbalizes importance of monitoring SPO2 with pulse oximeter and return demonstration;Maintenance of O2 saturations>88%;Demonstrates proper use of MDI's;Compliance with respiratory medication;Exhibits proper breathing techniques, such as pursed lip breathing or other method taught during program session Exhibits compliance with exercise, home and travel O2 prescription;Verbalizes importance of monitoring SPO2 with pulse oximeter and return demonstration;Maintenance of O2 saturations>88%;Exhibits proper breathing techniques, such as pursed lip breathing or other method taught during program session;Compliance with respiratory medication;Demonstrates proper use of MDI's     Goals/Expected Outcomes compliance compliance Compliance            Oxygen Discharge (Final Oxygen Re-Evaluation):  Oxygen Re-Evaluation - 06/05/20 1515      Program Oxygen Prescription   Program Oxygen Prescription None      Home Oxygen   Home Exercise Oxygen Prescription None    Home Resting Oxygen Prescription None    Compliance with Home Oxygen Use Yes      Goals/Expected Outcomes   Short Term Goals To learn and demonstrate proper use of respiratory medications;To learn and demonstrate proper pursed lip breathing techniques or other breathing techniques.;To learn and understand importance of maintaining oxygen saturations>88%;To learn and understand importance of monitoring SPO2 with pulse oximeter and demonstrate accurate use of the pulse oximeter.;To learn and exhibit compliance with exercise, home and travel O2 prescription    Long  Term Goals Exhibits compliance with  exercise, home and travel O2 prescription;Verbalizes importance of monitoring SPO2 with pulse oximeter and return demonstration;Maintenance of O2 saturations>88%;Exhibits proper breathing techniques, such as pursed lip breathing or other method taught during program session;Compliance with respiratory medication;Demonstrates proper use of MDI's    Goals/Expected Outcomes Compliance           Initial Exercise Prescription:  Initial Exercise Prescription - 04/05/20 1400      Date of Initial Exercise RX and Referring Provider   Date 04/05/20    Referring Provider Dr. Harrington Challenger    Expected Discharge Date 08/09/20      Treadmill   MPH 1.4    Grade 0    Minutes 17      NuStep   Level 1    SPM 80    Minutes 22      Prescription Details   Frequency (times per week)  2    Duration Progress to 30 minutes of continuous aerobic without signs/symptoms of physical distress      Intensity   Ratings of Perceived Exertion 11-13    Perceived Dyspnea 0-4      Resistance Training   Training Prescription Yes    Weight 2 lbs    Reps 10-15           Perform Capillary Blood Glucose checks as needed.  Exercise Prescription Changes:   Exercise Prescription Changes    Row Name 04/24/20 1225 05/08/20 1200 05/15/20 1200 05/17/20 1200 05/29/20 1200     Response to Exercise   Blood Pressure (Admit) 112/66 110/70 120/78 -- 118/84   Blood Pressure (Exercise) 134/64 160/82 144/70 -- 164/76   Blood Pressure (Exit) 100/52 114/60 110/60 -- 100/68   Heart Rate (Admit) 70 bpm 64 bpm 70 bpm -- 68 bpm   Heart Rate (Exercise) 108 bpm 103 bpm 108 bpm -- 109 bpm   Heart Rate (Exit) 82 bpm 73 bpm 72 bpm -- 84 bpm   Oxygen Saturation (Admit) 97 % 99 % 97 % -- 99 %   Oxygen Saturation (Exercise) 97 % 94 % 96 % -- 96 %   Oxygen Saturation (Exit) 98 % 98 % 97 % -- 97 %   Rating of Perceived Exertion (Exercise) _0 -- 10   Perceived Dyspnea (Exercise) _1 -- 10   Duration Continue with 30 min of  aerobic exercise without signs/symptoms of physical distress. Continue with 30 min of aerobic exercise without signs/symptoms of physical distress. Continue with 30 min of aerobic exercise without signs/symptoms of physical distress. -- Continue with 30 min of aerobic exercise without signs/symptoms of physical distress.   Intensity THRR unchanged THRR unchanged THRR unchanged -- THRR unchanged     Progression   Progression Continue to progress workloads to maintain intensity without signs/symptoms of physical distress. Continue to progress workloads to maintain intensity without signs/symptoms of physical distress. Continue to progress workloads to maintain intensity without signs/symptoms of physical distress. -- Continue to progress workloads to maintain intensity without signs/symptoms of physical distress.     Resistance Training   Training Prescription Yes Yes Yes -- Yes   Weight 2 lbs 2 lbs 2 lbs -- 2 lbs   Reps 10-15 10-15 10-15 -- 10-15   Time 10 Minutes 10 Minutes 10 Minutes -- 10 Minutes     Treadmill   MPH 2.1 2.2 2.2 -- 2.1   Grade 0 0 0 -- 0   Minutes _2 -- 17   METs 2.61 2.68 2.68 -- 2.61     NuStep   Level _3 -- 3   SPM 91 100 104 -- 122   Minutes _4 -- 22   METs 2.3 2.1 1.9 -- 1.8     Home Exercise Plan   Plans to continue exercise at -- -- -- Home (comment) --   Frequency -- -- -- Add 1 additional day to program exercise sessions. --   Initial Home Exercises Provided -- -- -- 05/17/20 --   [place] Name 06/05/20 1200 06/12/20 1202           Response to Exercise   Blood Pressure (Admit) 120/78 148/92      Blood Pressure (Exercise) 162/78 182/98      Blood Pressure (Exit) 112/70 142/90      Heart Rate (Admit) 63 bpm 64 bpm      Heart Rate (Exercise)  100 bpm 102 bpm      Heart Rate (Exit) 63 bpm 63 bpm      Oxygen Saturation (Admit) 100 % 99 %      Oxygen Saturation (Exercise) 97 % 97 %      Oxygen Saturation (Exit) 100 % 100 %      Rating of  Perceived Exertion (Exercise) 14 17      Perceived Dyspnea (Exercise) 15 15      Duration Continue with 30 min of aerobic exercise without signs/symptoms of physical distress. Continue with 30 min of aerobic exercise without signs/symptoms of physical distress.      Intensity THRR unchanged THRR unchanged             Progression   Progression Continue to progress workloads to maintain intensity without signs/symptoms of physical distress. Continue to progress workloads to maintain intensity without signs/symptoms of physical distress.             Resistance Training   Training Prescription Yes Yes      Weight 2 lbs 2 lbs      Reps 10-15 10-15      Time 10 Minutes 10 Minutes             Treadmill   MPH 1.8 2      Grade 0 0      Minutes 17 17      METs 2 2.53             NuStep   Level 3 3      SPM 68 65      Minutes 22 22      METs 1.8 1.7             Exercise Comments:   Exercise Comments    Row Name 04/12/20 1236 05/17/20 1201         Exercise Comments Pt completed her first exercise session in pulmonary rehab. She was able to tolerate the treadmill and the stepper well with no complaints. home exercise reviewed             Exercise Goals and Review:   Exercise Goals    Row Name 04/05/20 1500 04/18/20 0904 05/15/20 1212 06/05/20 1516       Exercise Goals   Increase Physical Activity Yes Yes Yes Yes    Intervention Provide advice, education, support and counseling about physical activity/exercise needs.;Develop an individualized exercise prescription for aerobic and resistive training based on initial evaluation findings, risk stratification, comorbidities and participant's personal goals. Provide advice, education, support and counseling about physical activity/exercise needs.;Develop an individualized exercise prescription for aerobic and resistive training based on initial evaluation findings, risk stratification, comorbidities and participant's personal  goals. Provide advice, education, support and counseling about physical activity/exercise needs.;Develop an individualized exercise prescription for aerobic and resistive training based on initial evaluation findings, risk stratification, comorbidities and participant's personal goals. Provide advice, education, support and counseling about physical activity/exercise needs.;Develop an individualized exercise prescription for aerobic and resistive training based on initial evaluation findings, risk stratification, comorbidities and participant's personal goals.    Expected Outcomes Short Term: Attend rehab on a regular basis to increase amount of physical activity.;Long Term: Add in home exercise to make exercise part of routine and to increase amount of physical activity.;Long Term: Exercising regularly at least 3-5 days a week. Short Term: Attend rehab on a regular basis to increase amount of physical activity.;Long Term: Add in home exercise to make exercise part of routine  and to increase amount of physical activity.;Long Term: Exercising regularly at least 3-5 days a week. Short Term: Attend rehab on a regular basis to increase amount of physical activity.;Long Term: Add in home exercise to make exercise part of routine and to increase amount of physical activity.;Long Term: Exercising regularly at least 3-5 days a week. Short Term: Attend rehab on a regular basis to increase amount of physical activity.;Long Term: Add in home exercise to make exercise part of routine and to increase amount of physical activity.;Long Term: Exercising regularly at least 3-5 days a week.    Increase Strength and Stamina Yes Yes Yes Yes    Intervention Provide advice, education, support and counseling about physical activity/exercise needs.;Develop an individualized exercise prescription for aerobic and resistive training based on initial evaluation findings, risk stratification, comorbidities and participant's personal goals.  Provide advice, education, support and counseling about physical activity/exercise needs.;Develop an individualized exercise prescription for aerobic and resistive training based on initial evaluation findings, risk stratification, comorbidities and participant's personal goals. Provide advice, education, support and counseling about physical activity/exercise needs.;Develop an individualized exercise prescription for aerobic and resistive training based on initial evaluation findings, risk stratification, comorbidities and participant's personal goals. Provide advice, education, support and counseling about physical activity/exercise needs.;Develop an individualized exercise prescription for aerobic and resistive training based on initial evaluation findings, risk stratification, comorbidities and participant's personal goals.    Expected Outcomes Short Term: Increase workloads from initial exercise prescription for resistance, speed, and METs.;Short Term: Perform resistance training exercises routinely during rehab and add in resistance training at home;Long Term: Improve cardiorespiratory fitness, muscular endurance and strength as measured by increased METs and functional capacity (6MWT) Short Term: Increase workloads from initial exercise prescription for resistance, speed, and METs.;Short Term: Perform resistance training exercises routinely during rehab and add in resistance training at home;Long Term: Improve cardiorespiratory fitness, muscular endurance and strength as measured by increased METs and functional capacity (6MWT) Short Term: Increase workloads from initial exercise prescription for resistance, speed, and METs.;Short Term: Perform resistance training exercises routinely during rehab and add in resistance training at home;Long Term: Improve cardiorespiratory fitness, muscular endurance and strength as measured by increased METs and functional capacity (6MWT) Short Term: Increase workloads from  initial exercise prescription for resistance, speed, and METs.;Short Term: Perform resistance training exercises routinely during rehab and add in resistance training at home;Long Term: Improve cardiorespiratory fitness, muscular endurance and strength as measured by increased METs and functional capacity (6MWT)    Able to understand and use rate of perceived exertion (RPE) scale Yes Yes Yes Yes    Intervention Provide education and explanation on how to use RPE scale Provide education and explanation on how to use RPE scale Provide education and explanation on how to use RPE scale Provide education and explanation on how to use RPE scale    Expected Outcomes Short Term: Able to use RPE daily in rehab to express subjective intensity level;Long Term:  Able to use RPE to guide intensity level when exercising independently Short Term: Able to use RPE daily in rehab to express subjective intensity level;Long Term:  Able to use RPE to guide intensity level when exercising independently Short Term: Able to use RPE daily in rehab to express subjective intensity level;Long Term:  Able to use RPE to guide intensity level when exercising independently Short Term: Able to use RPE daily in rehab to express subjective intensity level;Long Term:  Able to use RPE to guide intensity level when exercising independently  Able to understand and use Dyspnea scale Yes Yes Yes Yes    Intervention Provide education and explanation on how to use Dyspnea scale Provide education and explanation on how to use Dyspnea scale Provide education and explanation on how to use Dyspnea scale Provide education and explanation on how to use Dyspnea scale    Expected Outcomes Short Term: Able to use Dyspnea scale daily in rehab to express subjective sense of shortness of breath during exertion;Long Term: Able to use Dyspnea scale to guide intensity level when exercising independently Short Term: Able to use Dyspnea scale daily in rehab to  express subjective sense of shortness of breath during exertion;Long Term: Able to use Dyspnea scale to guide intensity level when exercising independently Short Term: Able to use Dyspnea scale daily in rehab to express subjective sense of shortness of breath during exertion;Long Term: Able to use Dyspnea scale to guide intensity level when exercising independently Short Term: Able to use Dyspnea scale daily in rehab to express subjective sense of shortness of breath during exertion;Long Term: Able to use Dyspnea scale to guide intensity level when exercising independently    Knowledge and understanding of Target Heart Rate Range (THRR) Yes Yes Yes Yes    Intervention Provide education and explanation of THRR including how the numbers were predicted and where they are located for reference Provide education and explanation of THRR including how the numbers were predicted and where they are located for reference Provide education and explanation of THRR including how the numbers were predicted and where they are located for reference Provide education and explanation of THRR including how the numbers were predicted and where they are located for reference    Expected Outcomes Short Term: Able to state/look up THRR;Long Term: Able to use THRR to govern intensity when exercising independently;Short Term: Able to use daily as guideline for intensity in rehab Short Term: Able to state/look up THRR;Long Term: Able to use THRR to govern intensity when exercising independently;Short Term: Able to use daily as guideline for intensity in rehab Short Term: Able to state/look up THRR;Long Term: Able to use THRR to govern intensity when exercising independently;Short Term: Able to use daily as guideline for intensity in rehab Short Term: Able to state/look up THRR;Long Term: Able to use THRR to govern intensity when exercising independently;Short Term: Able to use daily as guideline for intensity in rehab    Understanding of  Exercise Prescription Yes Yes Yes Yes    Intervention Provide education, explanation, and written materials on patient's individual exercise prescription Provide education, explanation, and written materials on patient's individual exercise prescription Provide education, explanation, and written materials on patient's individual exercise prescription Provide education, explanation, and written materials on patient's individual exercise prescription    Expected Outcomes Short Term: Able to explain program exercise prescription;Long Term: Able to explain home exercise prescription to exercise independently Short Term: Able to explain program exercise prescription;Long Term: Able to explain home exercise prescription to exercise independently Short Term: Able to explain program exercise prescription;Long Term: Able to explain home exercise prescription to exercise independently Short Term: Able to explain program exercise prescription;Long Term: Able to explain home exercise prescription to exercise independently           Exercise Goals Re-Evaluation :  Exercise Goals Re-Evaluation    Row Name 04/18/20 0904 05/15/20 1213 06/05/20 1517         Exercise Goal Re-Evaluation   Exercise Goals Review Increase Physical Activity;Able to understand and use rate  of perceived exertion (RPE) scale;Increase Strength and Stamina;Able to understand and use Dyspnea scale;Knowledge and understanding of Target Heart Rate Range (THRR);Understanding of Exercise Prescription Increase Physical Activity;Able to understand and use rate of perceived exertion (RPE) scale;Increase Strength and Stamina;Able to understand and use Dyspnea scale;Knowledge and understanding of Target Heart Rate Range (THRR);Understanding of Exercise Prescription Increase Physical Activity;Increase Strength and Stamina;Able to understand and use rate of perceived exertion (RPE) scale;Able to understand and use Dyspnea scale;Knowledge and understanding  of Target Heart Rate Range (THRR);Understanding of Exercise Prescription     Comments Pt has attended 2 exercise sessions. She has a positive attitude and will likely progress quickly. She currently exercises at 2.1 METs on the stepper. Will continue to monitor and progress as able. Pt has attended 8 exercise sessions. She remains positive and is progressing, but she has a lingering shoulder injury that is holding her back from her full potential. She currently exercises at 1.9 METs on the stepper. Will continue to monitor and progress as able. Pt has completed 13 exercise sessions. Pt is tolerating and progressing well. She has experienced a little more fatigue and exhaustion the last few exercise sessions. She is exercising at 1.8 MET's on the stepper. Will continue to progress exercise as able.     Expected Outcomes Through exercise at rehab and by engaging in a home exercise program, the pt will reach their goals. Through exercise at rehab and by engaging in a home exercise program, the pt will reach their goals. Through coming to rehab and exercising at home, patient will reach their goals.            Discharge Exercise Prescription (Final Exercise Prescription Changes):  Exercise Prescription Changes - 06/12/20 1202      Response to Exercise   Blood Pressure (Admit) 148/92    Blood Pressure (Exercise) 182/98    Blood Pressure (Exit) 142/90    Heart Rate (Admit) 64 bpm    Heart Rate (Exercise) 102 bpm    Heart Rate (Exit) 63 bpm    Oxygen Saturation (Admit) 99 %    Oxygen Saturation (Exercise) 97 %    Oxygen Saturation (Exit) 100 %    Rating of Perceived Exertion (Exercise) 17    Perceived Dyspnea (Exercise) 15    Duration Continue with 30 min of aerobic exercise without signs/symptoms of physical distress.    Intensity THRR unchanged      Progression   Progression Continue to progress workloads to maintain intensity without signs/symptoms of physical distress.      Resistance  Training   Training Prescription Yes    Weight 2 lbs    Reps 10-15    Time 10 Minutes      Treadmill   MPH 2    Grade 0    Minutes 17    METs 2.53      NuStep   Level 3    SPM 65    Minutes 22    METs 1.7           Nutrition:  Target Goals: Understanding of nutrition guidelines, daily intake of sodium <1559m, cholesterol <2052m calories 30% from fat and 7% or less from saturated fats, daily to have 5 or more servings of fruits and vegetables.  Biometrics:  Pre Biometrics - 06/19/20 1204      Pre Biometrics   Weight --    BMI (Calculated) --            Nutrition Therapy Plan and Nutrition  Goals:  Nutrition Therapy & Goals - 07/02/20 1258      Personal Nutrition Goals   Comments Continue to provide education through hand-outs regarding nutrition and healthier choices.      Intervention Plan   Intervention Nutrition handout(s) given to patient.           Nutrition Assessments:  Nutrition Assessments - 04/05/20 1503      MEDFICTS Scores   Pre Score 36          MEDIFICTS Score Key:  ?70 Need to make dietary changes   40-70 Heart Healthy Diet  ? 40 Therapeutic Level Cholesterol Diet   Picture Your Plate Scores:  <40 Unhealthy dietary pattern with much room for improvement.  41-50 Dietary pattern unlikely to meet recommendations for good health and room for improvement.  51-60 More healthful dietary pattern, with some room for improvement.   >60 Healthy dietary pattern, although there may be some specific behaviors that could be improved.    Nutrition Goals Re-Evaluation:   Nutrition Goals Discharge (Final Nutrition Goals Re-Evaluation):   Psychosocial: Target Goals: Acknowledge presence or absence of significant depression and/or stress, maximize coping skills, provide positive support system. Participant is able to verbalize types and ability to use techniques and skills needed for reducing stress and depression.  Initial Review &  Psychosocial Screening:  Initial Psych Review & Screening - 04/05/20 1511      Initial Review   Current issues with None Identified      Family Dynamics   Good Support System? Yes    Comments Patient lives with her husband. She has 4 children and 9 grandchildren. She says she is very involved in their lives and has a great relationship with them. 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 She denies any depression or anxiety. She says she has always had low self-esteem and continues to struggle with this. Will continue to monitor.      Barriers   Psychosocial barriers to participate in program Psychosocial barriers identified (see note)      Screening Interventions   Interventions Encouraged to exercise;Provide feedback about the scores to participant           Quality of Life Scores:  Quality of Life - 04/05/20 1514      Quality of Life   Select Quality of Life      Quality of Life Scores   Health/Function Pre 19.5 %    Socioeconomic Pre 23.71 %    Psych/Spiritual Pre 20.64 %    Family Pre 24 %    GLOBAL Pre 21.21 %          Scores of 19 and below usually indicate a poorer quality of life in these areas.  A difference of  2-3 points is a clinically meaningful difference.  A difference of 2-3 points in the total score of the Quality of Life Index has been associated with significant improvement in overall quality of life, self-image, physical symptoms, and general health in studies assessing change in quality of life.   PHQ-9: Recent Review Flowsheet Data    Depression screen [facility] 2/9 04/05/2020   Decreased Interest 0   Down, Depressed, Hopeless 0   PHQ - 2 Score 0   Altered sleeping 3   Tired, decreased energy 3   Change in appetite 2   Feeling bad or failure about yourself  2   Trouble concentrating 0   Moving  slowly or fidgety/restless 0   Suicidal thoughts 0   PHQ-9 Score 10   Difficult doing work/chores  Not difficult at all     Interpretation of Total Score  Total Score Depression Severity:  1-4 = Minimal depression, 5-9 = Mild depression, 10-14 = Moderate depression, 15-19 = Moderately severe depression, 20-27 = Severe depression   Psychosocial Evaluation and Intervention:  Psychosocial Evaluation - 04/05/20 1515      Psychosocial Evaluation & Interventions   Interventions Stress management education;Relaxation education;Encouraged to exercise with the program and follow exercise prescription    Comments Patient's initial QOL score was 21.21 and her PHQ-9 score was 10 with no psychosocial issues identified at her orientation visit. She says she has struggled with a low self-esteem her whole life but has a positive outlook. Will continue to monitor.    Expected Outcomes Patient will have no psychosocial issues identified at discharge.    Continue Psychosocial Services  No Follow up required           Psychosocial Re-Evaluation:  Psychosocial Re-Evaluation    [place] Name 04/19/20 0753 05/14/20 1411 06/04/20 1243         Psychosocial Re-Evaluation   Current issues with None Identified None Identified None Identified     Comments Patient continues to have no psychosocial issues identified. Will continue to monitor. Patient continues to have no psychosocial issues identified. Will continue to monitor. Patient continues to have no psychosocial issues identified. Patient work very hard during her sessions. She says she enjoys getting out and being in an exercise routine. 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 She is very interactive with others in her class and with staff. Will continue to monitor.     Expected Outcomes Patient will have no psychosocial issues identifed at discharge. Patient will have no psychosocial issues identifed at discharge. Patient will have no psychosocial issues identifed at discharge.     Interventions Relaxation  education;Stress management education;Encouraged to attend Pulmonary Rehabilitation for the exercise Relaxation education;Stress management education;Encouraged to attend Pulmonary Rehabilitation for the exercise Relaxation education;Stress management education;Encouraged to attend Pulmonary Rehabilitation for the exercise     Continue Psychosocial Services  No Follow up required No Follow up required No Follow up required            Psychosocial Discharge (Final Psychosocial Re-Evaluation):  Psychosocial Re-Evaluation - 06/04/20 1243      Psychosocial Re-Evaluation   Current issues with None Identified    Comments Patient continues to have no psychosocial issues identified. Patient work very hard during her sessions. She says she enjoys getting out and being in an exercise routine. 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 She is very interactive with others in her class and with staff. Will continue to monitor.    Expected Outcomes Patient will have no psychosocial issues identifed at discharge.    Interventions Relaxation education;Stress management education;Encouraged to attend Pulmonary Rehabilitation for the exercise    Continue Psychosocial Services  No Follow up required            Education: Education Goals: Education classes will be provided on a weekly basis, covering required topics. Participant will state understanding/return demonstration of topics presented.  Learning Barriers/Preferences:  Learning Barriers/Preferences - 04/05/20 1503      Learning Barriers/Preferences   Learning Barriers None    Learning Preferences Skilled Demonstration  Education Topics: How Lungs Work and Diseases: - Discuss the anatomy of the lungs and diseases that can affect the lungs, such as COPD.   Exercise: -Discuss the importance of exercise, FITT principles of exercise, normal and abnormal responses to exercise, and how to exercise  safely.   Environmental Irritants: -Discuss types of environmental irritants and how to limit exposure to environmental irritants. Flowsheet Row PULMONARY REHAB OTHER RESPIRATORY from 06/07/2020 in [place]  Date 05/03/20  Educator Etheleen Mayhew  Instruction Review Code 1- Verbalizes Understanding      Meds/Inhalers and oxygen: - Discuss respiratory medications, definition of an inhaler and oxygen, and the proper way to use an inhaler and oxygen.   Energy Saving Techniques: - Discuss methods to conserve energy and decrease shortness of breath when performing activities of daily living.  Flowsheet Row PULMONARY REHAB OTHER RESPIRATORY from 06/07/2020 in [place]  Date 05/17/20  Educator DF  Instruction Review Code 1- Verbalizes Understanding      Bronchial Hygiene / Breathing Techniques: - Discuss breathing mechanics, pursed-lip breathing technique,  proper posture, effective ways to clear airways, and other functional breathing techniques Flowsheet Row PULMONARY REHAB OTHER RESPIRATORY from 06/07/2020 in [place]  Date 05/24/20  Educator DF  Instruction Review Code 2- Demonstrated Understanding      Cleaning Equipment: - Provides group verbal and written instruction about the health risks of elevated stress, cause of high stress, and healthy ways to reduce stress.   Nutrition I: Fats: - Discuss the types of cholesterol, what cholesterol does to the body, and how cholesterol levels can be controlled. Flowsheet Row PULMONARY REHAB OTHER RESPIRATORY from 06/07/2020 in [place]  Date 06/07/20  Educator Etheleen Mayhew  Instruction Review Code 1- Verbalizes Understanding      Nutrition II: Labels: -Discuss the different components of food labels and how to read food labels.   Respiratory Infections: - Discuss the signs and symptoms of respiratory infections, ways to prevent  respiratory infections, and the importance of seeking medical treatment when having a respiratory infection.   Stress I: Signs and Symptoms: - Discuss the causes of stress, how stress may lead to anxiety and depression, and ways to limit stress.   Stress II: Relaxation: -Discuss relaxation techniques to limit stress.   Oxygen for Home/Travel: - Discuss how to prepare for travel when on oxygen and proper ways to transport and store oxygen to ensure safety.   Knowledge Questionnaire Score:  Knowledge Questionnaire Score - 04/05/20 1503      Knowledge Questionnaire Score   Pre Score 15/18           Core Components/Risk Factors/Patient Goals at Admission:  Personal Goals and Risk Factors at Admission - 04/05/20 1510      Core Components/Risk Factors/Patient Goals on Admission    Weight Management Obesity    Personal Goal Other Yes    Personal Goal Be able to do her ADL's without getting SOB.    Intervention Patient will attend PR 2 days/week and supplement with exercise at home 3 days/week.    Expected Outcomes Patient will complete the program meeting her personal and program goals.           Core Components/Risk Factors/Patient Goals Review:   Goals and Risk Factor Review    Row Name 04/19/20 0748 05/14/20 1411 06/04/20 1245 07/02/20 1258       Core Components/Risk Factors/Patient Goals Review   Personal Goals Review Weight Management/Obesity;Other Weight  Management/Obesity;Other -- Weight Management/Obesity;Other    Review Patient is new to the program.  She has completed 3 sessions without difficulty. Will continue to montior as she works toward meeting her personal and program goals. Patient has completed 7 sessions gaining 3 lbs since her initial visit. She is doing well in pulmonary rehab with progression and consistent attendance. Her personal goals are to be able to do her ADL's without getting SOB. She pushes herself during the sessions and feels she is making  progress toward meeting her goals. Will continue to monitor. Patient has completed 12 sessions maintaining her weight since last 30 day review. She continues to do very well with progression and consistent attendance. She is highly motivated and works hard. Her personal goals are to be able to do her ADL's without getting SOB. She says this is improving and she feels stronger overall. Will continue to monitor for progress. Patient has completed 15 sessions gaining 3.5 lbs since last 30 day review. She has missed 3 weeks due to her work schedule. We will evaluate if she is going to be able to return to the program this week. Her personal goals are to be able to do her ADL's without getting SOB. Will continue to monitor.    Expected Outcomes Patient will complete the program meeting her personal and program goals. Patient will complete the program meeting her personal and program goals. Patient will complete the program meeting her personal and program goals. Patient will complete the program meeting her personal and program goals.           Core Components/Risk Factors/Patient Goals at Discharge (Final Review):   Goals and Risk Factor Review - 07/02/20 1258      Core Components/Risk Factors/Patient Goals Review   Personal Goals Review Weight Management/Obesity;Other    Review Patient has completed 15 sessions gaining 3.5 lbs since last 30 day review. She has missed 3 weeks due to her work schedule. We will evaluate if she is going to be able to return to the program this week. Her personal goals are to be able to do her ADL's without getting SOB. Will continue to monitor.    Expected Outcomes Patient will complete the program meeting her personal and program goals.           ITP Comments:   Comments: ITP REVIEW Pt is making expected progress toward pulmonary rehab goals after completing 15 sessions. Recommend continued exercise, life style modification, education, and utilization of breathing  techniques to increase stamina and strength and decrease shortness of breath with exertion.

## 2020-07-05 NOTE — Addendum Note (Signed)
Encounter addended by: Dwana Melena, RN on: 07/05/2020 7:53 AM  Actions taken: Clinical Note Signed

## 2020-07-10 ENCOUNTER — Encounter (HOSPITAL_COMMUNITY): Payer: 59

## 2020-07-12 ENCOUNTER — Encounter (HOSPITAL_COMMUNITY): Payer: 59

## 2020-07-17 ENCOUNTER — Encounter (HOSPITAL_COMMUNITY): Payer: 59

## 2020-07-19 ENCOUNTER — Encounter (HOSPITAL_COMMUNITY): Payer: 59

## 2020-07-23 NOTE — Progress Notes (Signed)
Discharge Progress Report  Patient Details  Name: Meagan Reyes MRN: [id number] Date of Birth: [birth year] Referring Provider:   [place] from 04/05/2020 in [place]  Referring Provider Dr. Harrington Challenger       Number of Visits: 15  Reason for Discharge:  Early Exit:  Lack of attendance  Smoking History:  Social History   Tobacco Use  Smoking Status Former Smoker  . Packs/day: 0.10  . Years: 0.25  . Pack years: 0.02  . Start date: 54  . Quit date: 33  . Years since quitting: 46.0  Smokeless Tobacco Never Used  Tobacco Comment   a couple months when 19 then stopped    Diagnosis:  SOB (shortness of breath)  ADL UCSD:  Pulmonary Assessment Scores    Row Name 04/05/20 1450         ADL UCSD   ADL Phase Entry     SOB Score total 70     Rest 1     Walk 3     Stairs 4     Bath 3     Dress 3     Shop 5           CAT Score   CAT Score 15           mMRC Score   mMRC Score 3            Initial Exercise Prescription:  Initial Exercise Prescription - 04/05/20 1400      Date of Initial Exercise RX and Referring Provider   Date 04/05/20    Referring Provider Dr. Harrington Challenger    Expected Discharge Date 08/09/20      Treadmill   MPH 1.4    Grade 0    Minutes 17      NuStep   Level 1    SPM 80    Minutes 22      Prescription Details   Frequency (times per week) 2    Duration Progress to 30 minutes of continuous aerobic without signs/symptoms of physical distress      Intensity   Ratings of Perceived Exertion 11-13    Perceived Dyspnea 0-4      Resistance Training   Training Prescription Yes    Weight 2 lbs    Reps 10-15           Discharge Exercise Prescription (Final Exercise Prescription Changes):  Exercise Prescription Changes - 06/12/20 1202      Response to Exercise   Blood Pressure (Admit) 148/92    Blood Pressure (Exercise) 182/98    Blood Pressure (Exit) 142/90    Heart  Rate (Admit) 64 bpm    Heart Rate (Exercise) 102 bpm    Heart Rate (Exit) 63 bpm    Oxygen Saturation (Admit) 99 %    Oxygen Saturation (Exercise) 97 %    Oxygen Saturation (Exit) 100 %    Rating of Perceived Exertion (Exercise) 17    Perceived Dyspnea (Exercise) 15    Duration Continue with 30 min of aerobic exercise without signs/symptoms of physical distress.    Intensity THRR unchanged      Progression   Progression Continue to progress workloads to maintain intensity without signs/symptoms of physical distress.      Resistance Training   Training Prescription Yes    Weight 2 lbs    Reps 10-15    Time 10 Minutes      Treadmill   MPH  2    Grade 0    Minutes 17    METs 2.53      NuStep   Level 3    SPM 65    Minutes 22    METs 1.7           Functional Capacity:  6 Minute Walk    Row Name 04/05/20 1450         6 Minute Walk   Phase Initial     Distance 1000 feet     Walk Time 6 minutes     # of Rest Breaks 0     MPH 1.9     METS 1.68     RPE 5.89     Perceived Dyspnea  13     VO2 Peak 9     Symptoms No     Resting HR 62 bpm     Resting BP 126/88     Resting Oxygen Saturation  97 %     Exercise Oxygen Saturation  during 6 min walk 94 %     Max Ex. HR 107 bpm     Max Ex. BP 136/70     2 Minute Post BP 124/70           Interval HR   1 Minute HR 95     2 Minute HR 107     3 Minute HR 102     4 Minute HR 95     5 Minute HR 96     6 Minute HR 96     2 Minute Post HR 61     Interval Heart Rate? Yes           Interval Oxygen   Interval Oxygen? Yes     Baseline Oxygen Saturation % 97 %     1 Minute Oxygen Saturation % 95 %     1 Minute Liters of Oxygen 0 L     2 Minute Oxygen Saturation % 95 %     2 Minute Liters of Oxygen 0 L     3 Minute Oxygen Saturation % 94 %     3 Minute Liters of Oxygen 0 L     4 Minute Oxygen Saturation % 99 %     4 Minute Liters of Oxygen 0 L     5 Minute Oxygen Saturation % 100 %     5 Minute Liters of Oxygen 0 L      6 Minute Oxygen Saturation % 100 %     6 Minute Liters of Oxygen 0 L     2 Minute Post Oxygen Saturation % 100 %     2 Minute Post Liters of Oxygen 0 L            Psychological, QOL, Others - Outcomes: PHQ 2/9: Depression screen PHQ 2/9 04/05/2020  Decreased Interest 0  Down, Depressed, Hopeless 0  PHQ - 2 Score 0  Altered sleeping 3  Tired, decreased energy 3  Change in appetite 2  Feeling bad or failure about yourself  2  Trouble concentrating 0  Moving slowly or fidgety/restless 0  Suicidal thoughts 0  PHQ-9 Score 10  Difficult doing work/chores Not difficult at all    Quality of Life:  Quality of Life - 04/05/20 1514      Quality of Life   Select Quality of Life      Quality of Life Scores   Health/Function Pre 19.5 %    Socioeconomic Pre  23.71 %    Psych/Spiritual Pre 20.64 %    Family Pre 24 %    GLOBAL Pre 21.21 %           Personal Goals: Goals established at orientation with interventions provided to work toward goal.  Personal Goals and Risk Factors at Admission - 04/05/20 1510      Core Components/Risk Factors/Patient Goals on Admission    Weight Management Obesity    Personal Goal Other Yes    Personal Goal Be able to do her ADL's without getting SOB.    Intervention Patient will attend PR 2 days/week and supplement with exercise at home 3 days/week.    Expected Outcomes Patient will complete the program meeting her personal and program goals.            Personal Goals Discharge:  Goals and Risk Factor Review    Row Name 04/19/20 0748 05/14/20 1411 06/04/20 1245 07/02/20 1258       Core Components/Risk Factors/Patient Goals Review   Personal Goals Review Weight Management/Obesity;Other Weight Management/Obesity;Other -- Weight Management/Obesity;Other    Review Patient is new to the program.  She has completed 3 sessions without difficulty. Will continue to montior as she works toward meeting her personal and program goals. Patient has  completed 7 sessions gaining 3 lbs since her initial visit. She is doing well in pulmonary rehab with progression and consistent attendance. Her personal goals are to be able to do her ADL's without getting SOB. She pushes herself during the sessions and feels she is making progress toward meeting her goals. Will continue to monitor. Patient has completed 12 sessions maintaining her weight since last 30 day review. She continues to do very well with progression and consistent attendance. She is highly motivated and works hard. Her personal goals are to be able to do her ADL's without getting SOB. She says this is improving and she feels stronger overall. Will continue to monitor for progress. Patient has completed 15 sessions gaining 3.5 lbs since last 30 day review. She has missed 3 weeks due to her work schedule. We will evaluate if she is going to be able to return to the program this week. Her personal goals are to be able to do her ADL's without getting SOB. Will continue to monitor.    Expected Outcomes Patient will complete the program meeting her personal and program goals. Patient will complete the program meeting her personal and program goals. Patient will complete the program meeting her personal and program goals. Patient will complete the program meeting her personal and program goals.           Exercise Goals and Review:  Exercise Goals    Row Name 04/05/20 1500 04/18/20 0904 05/15/20 1212 06/05/20 1516       Exercise Goals   Increase Physical Activity Yes Yes Yes Yes    Intervention Provide advice, education, support and counseling about physical activity/exercise needs.;Develop an individualized exercise prescription for aerobic and resistive training based on initial evaluation findings, risk stratification, comorbidities and participant's personal goals. Provide advice, education, support and counseling about physical activity/exercise needs.;Develop an individualized exercise  prescription for aerobic and resistive training based on initial evaluation findings, risk stratification, comorbidities and participant's personal goals. Provide advice, education, support and counseling about physical activity/exercise needs.;Develop an individualized exercise prescription for aerobic and resistive training based on initial evaluation findings, risk stratification, comorbidities and participant's personal goals. Provide advice, education, support and counseling about physical activity/exercise needs.;Develop an  individualized exercise prescription for aerobic and resistive training based on initial evaluation findings, risk stratification, comorbidities and participant's personal goals.    Expected Outcomes Short Term: Attend rehab on a regular basis to increase amount of physical activity.;Long Term: Add in home exercise to make exercise part of routine and to increase amount of physical activity.;Long Term: Exercising regularly at least 3-5 days a week. Short Term: Attend rehab on a regular basis to increase amount of physical activity.;Long Term: Add in home exercise to make exercise part of routine and to increase amount of physical activity.;Long Term: Exercising regularly at least 3-5 days a week. Short Term: Attend rehab on a regular basis to increase amount of physical activity.;Long Term: Add in home exercise to make exercise part of routine and to increase amount of physical activity.;Long Term: Exercising regularly at least 3-5 days a week. Short Term: Attend rehab on a regular basis to increase amount of physical activity.;Long Term: Add in home exercise to make exercise part of routine and to increase amount of physical activity.;Long Term: Exercising regularly at least 3-5 days a week.    Increase Strength and Stamina Yes Yes Yes Yes    Intervention Provide advice, education, support and counseling about physical activity/exercise needs.;Develop an individualized exercise  prescription for aerobic and resistive training based on initial evaluation findings, risk stratification, comorbidities and participant's personal goals. Provide advice, education, support and counseling about physical activity/exercise needs.;Develop an individualized exercise prescription for aerobic and resistive training based on initial evaluation findings, risk stratification, comorbidities and participant's personal goals. Provide advice, education, support and counseling about physical activity/exercise needs.;Develop an individualized exercise prescription for aerobic and resistive training based on initial evaluation findings, risk stratification, comorbidities and participant's personal goals. Provide advice, education, support and counseling about physical activity/exercise needs.;Develop an individualized exercise prescription for aerobic and resistive training based on initial evaluation findings, risk stratification, comorbidities and participant's personal goals.    Expected Outcomes Short Term: Increase workloads from initial exercise prescription for resistance, speed, and METs.;Short Term: Perform resistance training exercises routinely during rehab and add in resistance training at home;Long Term: Improve cardiorespiratory fitness, muscular endurance and strength as measured by increased METs and functional capacity (6MWT) Short Term: Increase workloads from initial exercise prescription for resistance, speed, and METs.;Short Term: Perform resistance training exercises routinely during rehab and add in resistance training at home;Long Term: Improve cardiorespiratory fitness, muscular endurance and strength as measured by increased METs and functional capacity (6MWT) Short Term: Increase workloads from initial exercise prescription for resistance, speed, and METs.;Short Term: Perform resistance training exercises routinely during rehab and add in resistance training at home;Long Term: Improve  cardiorespiratory fitness, muscular endurance and strength as measured by increased METs and functional capacity (6MWT) Short Term: Increase workloads from initial exercise prescription for resistance, speed, and METs.;Short Term: Perform resistance training exercises routinely during rehab and add in resistance training at home;Long Term: Improve cardiorespiratory fitness, muscular endurance and strength as measured by increased METs and functional capacity (6MWT)    Able to understand and use rate of perceived exertion (RPE) scale Yes Yes Yes Yes    Intervention Provide education and explanation on how to use RPE scale Provide education and explanation on how to use RPE scale Provide education and explanation on how to use RPE scale Provide education and explanation on how to use RPE scale    Expected Outcomes Short Term: Able to use RPE daily in rehab to express subjective intensity level;Long Term:  Able to  use RPE to guide intensity level when exercising independently Short Term: Able to use RPE daily in rehab to express subjective intensity level;Long Term:  Able to use RPE to guide intensity level when exercising independently Short Term: Able to use RPE daily in rehab to express subjective intensity level;Long Term:  Able to use RPE to guide intensity level when exercising independently Short Term: Able to use RPE daily in rehab to express subjective intensity level;Long Term:  Able to use RPE to guide intensity level when exercising independently    Able to understand and use Dyspnea scale Yes Yes Yes Yes    Intervention Provide education and explanation on how to use Dyspnea scale Provide education and explanation on how to use Dyspnea scale Provide education and explanation on how to use Dyspnea scale Provide education and explanation on how to use Dyspnea scale    Expected Outcomes Short Term: Able to use Dyspnea scale daily in rehab to express subjective sense of shortness of breath during  exertion;Long Term: Able to use Dyspnea scale to guide intensity level when exercising independently Short Term: Able to use Dyspnea scale daily in rehab to express subjective sense of shortness of breath during exertion;Long Term: Able to use Dyspnea scale to guide intensity level when exercising independently Short Term: Able to use Dyspnea scale daily in rehab to express subjective sense of shortness of breath during exertion;Long Term: Able to use Dyspnea scale to guide intensity level when exercising independently Short Term: Able to use Dyspnea scale daily in rehab to express subjective sense of shortness of breath during exertion;Long Term: Able to use Dyspnea scale to guide intensity level when exercising independently    Knowledge and understanding of Target Heart Rate Range (THRR) Yes Yes Yes Yes    Intervention Provide education and explanation of THRR including how the numbers were predicted and where they are located for reference Provide education and explanation of THRR including how the numbers were predicted and where they are located for reference Provide education and explanation of THRR including how the numbers were predicted and where they are located for reference Provide education and explanation of THRR including how the numbers were predicted and where they are located for reference    Expected Outcomes Short Term: Able to state/look up THRR;Long Term: Able to use THRR to govern intensity when exercising independently;Short Term: Able to use daily as guideline for intensity in rehab Short Term: Able to state/look up THRR;Long Term: Able to use THRR to govern intensity when exercising independently;Short Term: Able to use daily as guideline for intensity in rehab Short Term: Able to state/look up THRR;Long Term: Able to use THRR to govern intensity when exercising independently;Short Term: Able to use daily as guideline for intensity in rehab Short Term: Able to state/look up THRR;Long  Term: Able to use THRR to govern intensity when exercising independently;Short Term: Able to use daily as guideline for intensity in rehab    Understanding of Exercise Prescription Yes Yes Yes Yes    Intervention Provide education, explanation, and written materials on patient's individual exercise prescription Provide education, explanation, and written materials on patient's individual exercise prescription Provide education, explanation, and written materials on patient's individual exercise prescription Provide education, explanation, and written materials on patient's individual exercise prescription    Expected Outcomes Short Term: Able to explain program exercise prescription;Long Term: Able to explain home exercise prescription to exercise independently Short Term: Able to explain program exercise prescription;Long Term: Able to explain home exercise  prescription to exercise independently Short Term: Able to explain program exercise prescription;Long Term: Able to explain home exercise prescription to exercise independently Short Term: Able to explain program exercise prescription;Long Term: Able to explain home exercise prescription to exercise independently           Exercise Goals Re-Evaluation:  Exercise Goals Re-Evaluation    Row Name 04/18/20 0904 05/15/20 1213 06/05/20 1517         Exercise Goal Re-Evaluation   Exercise Goals Review Increase Physical Activity;Able to understand and use rate of perceived exertion (RPE) scale;Increase Strength and Stamina;Able to understand and use Dyspnea scale;Knowledge and understanding of Target Heart Rate Range (THRR);Understanding of Exercise Prescription Increase Physical Activity;Able to understand and use rate of perceived exertion (RPE) scale;Increase Strength and Stamina;Able to understand and use Dyspnea scale;Knowledge and understanding of Target Heart Rate Range (THRR);Understanding of Exercise Prescription Increase Physical  Activity;Increase Strength and Stamina;Able to understand and use rate of perceived exertion (RPE) scale;Able to understand and use Dyspnea scale;Knowledge and understanding of Target Heart Rate Range (THRR);Understanding of Exercise Prescription     Comments Pt has attended 2 exercise sessions. She has a positive attitude and will likely progress quickly. She currently exercises at 2.1 METs on the stepper. Will continue to monitor and progress as able. Pt has attended 8 exercise sessions. She remains positive and is progressing, but she has a lingering shoulder injury that is holding her back from her full potential. She currently exercises at 1.9 METs on the stepper. Will continue to monitor and progress as able. Pt has completed 13 exercise sessions. Pt is tolerating and progressing well. She has experienced a little more fatigue and exhaustion the last few exercise sessions. She is exercising at 1.8 MET's on the stepper. Will continue to progress exercise as able.     Expected Outcomes Through exercise at rehab and by engaging in a home exercise program, the pt will reach their goals. Through exercise at rehab and by engaging in a home exercise program, the pt will reach their goals. Through coming to rehab and exercising at home, patient will reach their goals.            Nutrition & Weight - Outcomes:  Pre Biometrics - 06/19/20 1204      Pre Biometrics   Weight --    BMI (Calculated) --            Nutrition:  Nutrition Therapy & Goals - 07/02/20 1258      Personal Nutrition Goals   Comments Continue to provide education through hand-outs regarding nutrition and healthier choices.      Intervention Plan   Intervention Nutrition handout(s) given to patient.           Nutrition Discharge:  Nutrition Assessments - 04/05/20 1503      MEDFICTS Scores   Pre Score 36           Education Questionnaire Score:  Knowledge Questionnaire Score - 04/05/20 1503      Knowledge  Questionnaire Score   Pre Score 15/18           Henya was discharged from Pulmonary Rehab on 07/19/2020 after 15 visits. She last attended exercise on 06/12/2020 and missed five consecutive weeks. She called on 07/19/2020 after she had several no call no shows in a row that she had missed due to a plethora of reasons including work, being sick, and the death of her mother. She stated that she would need to miss another  two weeks. She was dropped from the program, because at that point, she would have missed 7 weeks in a row. While she was coming consistently she was able to increase her MET level from 2.22 to 2.53. She did gain 4.7 kg while she was in the program. She admitted that due to her work schedule, her and her husband ate fast food for most meals. Towards the end of the program, she was having a lot of right shoulder and right knee pain that was hindering her during her exercises. I explained to her that if she would ever like to try the program again in the future, to reach out to her doctor to get another referral.

## 2020-07-23 NOTE — Addendum Note (Signed)
Encounter addended by: Alisia Ferrari on: 07/23/2020 11:38 AM  Actions taken: Flowsheet accepted, Clinical Note Signed

## 2020-07-24 ENCOUNTER — Encounter (HOSPITAL_COMMUNITY): Payer: 59

## 2020-07-26 ENCOUNTER — Encounter (HOSPITAL_COMMUNITY): Payer: 59

## 2020-07-26 NOTE — Addendum Note (Signed)
Encounter addended by: Alisia Ferrari on: 07/26/2020 11:59 AM  Actions taken: Episode resolved

## 2020-07-31 ENCOUNTER — Encounter (HOSPITAL_COMMUNITY): Payer: 59

## 2020-08-02 ENCOUNTER — Encounter (HOSPITAL_COMMUNITY): Payer: 59

## 2020-08-07 ENCOUNTER — Encounter (HOSPITAL_COMMUNITY): Payer: 59

## 2020-08-09 ENCOUNTER — Encounter (HOSPITAL_COMMUNITY): Payer: 59

## 2020-08-27 ENCOUNTER — Other Ambulatory Visit: Payer: Self-pay | Admitting: Orthopedic Surgery

## 2020-08-27 DIAGNOSIS — M1710 Unilateral primary osteoarthritis, unspecified knee: Secondary | ICD-10-CM

## 2020-08-28 NOTE — Telephone Encounter (Signed)
Rx request 

## 2020-08-29 ENCOUNTER — Other Ambulatory Visit: Payer: Self-pay | Admitting: Family Medicine

## 2020-08-29 DIAGNOSIS — R2240 Localized swelling, mass and lump, unspecified lower limb: Secondary | ICD-10-CM

## 2020-09-06 ENCOUNTER — Encounter: Payer: Self-pay | Admitting: Gastroenterology

## 2020-09-18 ENCOUNTER — Other Ambulatory Visit (HOSPITAL_COMMUNITY): Payer: Self-pay | Admitting: Internal Medicine

## 2020-09-18 ENCOUNTER — Other Ambulatory Visit: Payer: Self-pay | Admitting: Internal Medicine

## 2020-09-18 DIAGNOSIS — E876 Hypokalemia: Secondary | ICD-10-CM

## 2020-09-18 DIAGNOSIS — R519 Headache, unspecified: Secondary | ICD-10-CM

## 2020-09-18 DIAGNOSIS — R944 Abnormal results of kidney function studies: Secondary | ICD-10-CM

## 2020-09-18 DIAGNOSIS — I10 Essential (primary) hypertension: Secondary | ICD-10-CM

## 2020-09-19 ENCOUNTER — Encounter: Payer: Self-pay | Admitting: Family Medicine

## 2020-10-09 ENCOUNTER — Encounter: Payer: Self-pay | Admitting: General Surgery

## 2020-10-09 ENCOUNTER — Ambulatory Visit (INDEPENDENT_AMBULATORY_CARE_PROVIDER_SITE_OTHER): Payer: 59 | Admitting: General Surgery

## 2020-10-09 ENCOUNTER — Other Ambulatory Visit: Payer: Self-pay

## 2020-10-09 VITALS — BP 116/71 | HR 56 | Temp 97.2°F | Resp 18 | Ht 68.0 in | Wt 303.0 lb

## 2020-10-09 DIAGNOSIS — R2240 Localized swelling, mass and lump, unspecified lower limb: Secondary | ICD-10-CM

## 2020-10-09 NOTE — Progress Notes (Signed)
[facility] History and Physical  Reason for Referral: Left lower extremity skin/ subcutaneous mass?  Referring Physician:  Redmond School, MD    Chief Complaint    New Patient (Initial Visit)      Meagan Reyes is a 65 y.o. female.  HPI: Meagan Reyes is a 65 yo who had COVID in January and was seen by her PCP. She had noticed a hard lump on her left lower extremity that was a little tender but was skin colored and had no redness or drainage. This has decreased in size some from when she first noted it.  She says that it does not really bother her unless she palpates the area. She just wanted to be evaluated to ensure that it was nothing more concerning.   Past Medical History:  Diagnosis Date  . Chronic diastolic heart failure ([place])   . Hyperlipemia   . Hypertension     Past Surgical History:  Procedure Laterality Date  . ABDOMINAL HYSTERECTOMY    . BACK SURGERY    . HEEL SPUR EXCISION    . KNEE ARTHROSCOPY WITH LATERAL MENISECTOMY Right 10/18/2015   Procedure: RIGHT KNEE ARTHROSCOPY WITH LATERAL MENISECTOMY;  Surgeon: [name], MD;  Location: [facility];  Service: Orthopedics;  Laterality: Right;  . TONSILLECTOMY      Family History  Problem Relation Age of Onset  . Stroke Mother   . Asthma Mother   . Atrial fibrillation Mother   . Leukemia Mother   . COPD Mother   . Cancer Father        lung  . Hyperlipidemia Brother   . Hyperlipidemia Brother   . Hyperlipidemia Brother     Social History   Tobacco Use  . Smoking status: Former Smoker    Packs/day: 0.10    Years: 0.25    Pack years: 0.02    Start date: 1976    Quit date: 1976    Years since quitting: 46.2  . Smokeless tobacco: Never Used  . Tobacco comment: a couple months when 19 then stopped  Vaping Use  . Vaping Use: Never used  Substance Use Topics  . Alcohol use: No  . Drug use: No    Medications: I have reviewed the patient's current medications. Allergies as of  10/09/2020      Reactions   Atorvastatin Other (See Comments)   Myalgia; pt reports this occurred in past, was prescribed by different doctor.   Doxycycline    Shortness or breath one time another time had symptoms of heart attack   Neurontin [gabapentin] Rash      Medication List       Accurate as of October 09, 2020  9:19 AM. If you have any questions, ask your nurse or doctor.        aspirin 81 MG chewable tablet Chew 81 mg by mouth at bedtime.   ATARAX PO Take by mouth as needed.   CALCIUM PO Take 1 tablet by mouth daily.   cetirizine 10 MG tablet Commonly known as: ZYRTEC Take 1 tablet (10 mg total) by mouth daily.   famotidine 20 MG tablet Commonly known as: PEPCID Take 20 mg by mouth 2 (two) times daily.   Fish Oil 1000 MG Caps Take 2,000 capsules by mouth daily.   furosemide 40 MG tablet Commonly known as: LASIX Take 40 mg by mouth daily.   MAGNESIUM PO Take 1 tablet by mouth daily.   MEGA-CHOL PO Take 4 capsules by  mouth 2 (two) times daily with a meal. Takes Doterra EO mega fish oil   meloxicam 15 MG tablet Commonly known as: MOBIC Take 1 tablet by mouth once daily   pantoprazole 40 MG tablet Commonly known as: PROTONIX Take 40 mg by mouth daily.   potassium chloride 10 MEQ tablet Commonly known as: KLOR-CON Take 1 tablet (10 mEq total) by mouth daily.   SUPER B COMPLEX/C PO Take by mouth.   triamcinolone 0.1 % Commonly known as: KENALOG Apply 1 application topically 2 (two) times daily.   Turmeric 500 MG Caps Take by mouth.   UNABLE TO FIND Take 2 tablets by mouth every morning. Med Name: Moringa Oleifera   vitamin C 500 MG tablet Commonly known as: ASCORBIC ACID Take 500 mg by mouth daily.   Vitamin D3 25 MCG (1000 UT) Caps Take 2,000 Units by mouth 2 (two) times daily.   Zinc 15 MG Caps Take 15 mg by mouth daily.        ROS:  A comprehensive review of systems was negative except for: Respiratory: positive for  SOB Cardiovascular: positive for Varicose veins, high BP Musculoskeletal: positive for joint pain  Blood pressure 116/71, pulse (!) 56, temperature (!) 97.2 F (36.2 C), temperature source Oral, resp. rate 18, height 5\' 8"  (1.727 m), weight (!) 303 lb (137.4 kg), SpO2 96 %. Physical Exam Vitals reviewed.  Constitutional:      Appearance: She is obese.  HENT:     Head: Normocephalic.  Eyes:     Extraocular Movements: Extraocular movements intact.  Cardiovascular:     Rate and Rhythm: Normal rate.  Pulmonary:     Effort: Pulmonary effort is normal.     Breath sounds: Normal breath sounds.  Abdominal:     Palpations: Abdomen is soft.  Musculoskeletal:        General: Swelling present.     Cervical back: No rigidity.     Right lower leg: Edema present.     Left lower leg: Edema present.     Comments: Varicose veins peripherally and just above the slightly indurated area of the skin/subcutaneous tissue, about 1.5cm in size, no central pit, no skin changes, minor tenderness   Skin:    General: Skin is warm.  Neurological:     General: No focal deficit present.     Mental Status: She is alert and oriented to person, place, and time.  Psychiatric:        Mood and Affect: Mood normal.        Behavior: Behavior normal.     Results: None   Assessment & Plan:  Meagan Reyes is a 65 y.o. female with an indurated area on her leg just beneath a varicose vein. She could have had a trauma to the area and inflammation/ induration from bleeding or hematoma under the skin versus a small cyst. But more like trauma given no signs of infection or drainage. She also mentioned possible clot, and she could have had some thrombophlebitis in the superficial varicose veins but this would have been likely red.  Discussed that this type of clot would only have been treated with warm compress and NSAIDs.   -Offered US of the soft tissue to look at the area versus monitoring by the patient  -She wants  to monitor it for now as we discussed that if it is associated with the varicose vein she has a high risk of bleeding from doing any procedure   All questions  were answered to the satisfaction of the patient.  PRN follow up     Virl Cagey 10/09/2020, 9:19 AM

## 2020-10-09 NOTE — Patient Instructions (Signed)
Possible that some trauma caused some inflammation in the area versus a small cyst.   If something changes let us know. An [place] can be done of the area to look at that area specifically if you desire.

## 2020-10-12 ENCOUNTER — Ambulatory Visit (HOSPITAL_COMMUNITY)
Admission: RE | Admit: 2020-10-12 | Discharge: 2020-10-12 | Disposition: A | Discharge status: Home / Self Care | Payer: 59 | Source: Ambulatory Visit | Attending: Internal Medicine | Admitting: Internal Medicine

## 2020-10-12 DIAGNOSIS — R519 Headache, unspecified: Secondary | ICD-10-CM | POA: Insufficient documentation

## 2020-10-12 DIAGNOSIS — R944 Abnormal results of kidney function studies: Secondary | ICD-10-CM | POA: Insufficient documentation

## 2020-10-12 DIAGNOSIS — I10 Essential (primary) hypertension: Secondary | ICD-10-CM | POA: Diagnosis present

## 2020-10-12 LAB — POCT I-STAT CREATININE: Creatinine, Ser: 0.9 mg/dL (ref 0.44–1.00)

## 2020-10-12 MED ORDER — IOHEXOL 350 MG/ML SOLN
100.0000 mL | Freq: Once | INTRAVENOUS | Status: AC | PRN
  Administered 2020-10-12: 100 mL via INTRAVENOUS

## 2020-10-15 ENCOUNTER — Ambulatory Visit (INDEPENDENT_AMBULATORY_CARE_PROVIDER_SITE_OTHER): Payer: 59 | Admitting: Endocrinology

## 2020-10-15 ENCOUNTER — Other Ambulatory Visit: Payer: Self-pay

## 2020-10-15 DIAGNOSIS — E270 Other adrenocortical overactivity: Secondary | ICD-10-CM | POA: Insufficient documentation

## 2020-10-15 DIAGNOSIS — E261 Secondary hyperaldosteronism: Secondary | ICD-10-CM | POA: Diagnosis not present

## 2020-10-15 MED ORDER — DEXAMETHASONE 0.5 MG PO TABS: 0.5000 mg | ORAL_TABLET | Freq: Four times a day (QID) | ORAL | 0 refills | Status: DC

## 2020-10-15 NOTE — Patient Instructions (Addendum)
First, let's check a 24-HR urine test. Then, you would do a "suppression" test Day 1: Take 1 pill at in the afternoon, and another at bedtime Day 2: take 1 pill, 4 times this day--spread them out as best you can Day 3: take 1 pill in the morning, and the last one at noon. then do the blood test at 4PM.   No need to adjust your meals for any of this.

## 2020-10-15 NOTE — Progress Notes (Signed)
Subjective:    Patient ID: Meagan Reyes, female    DOB: [birth year], 65 y.o.   MRN: [id number]  HPI Pt is referred by Dr Gerarda Fraction.  Pt reports she has had HTN since 2000.  She last took steroid rx 6 weeks ago.  She has no h/o cancer, pituitary disorder, cataracts, PUD, osteoporosis, or adrenal disorder. only bony fracture was a foot, at age 39.  She takes BP meds as rx'ed.  She reports DOE, fatigue, chronic weight gain, and arthralgias.  Headache is resolved.   She takes Vit-D, 5000 units/d.   Past Medical History:  Diagnosis Date  . Chronic diastolic heart failure ([place])   . Hyperlipemia   . Hypertension     Past Surgical History:  Procedure Laterality Date  . ABDOMINAL HYSTERECTOMY    . BACK SURGERY    . HEEL SPUR EXCISION    . KNEE ARTHROSCOPY WITH LATERAL MENISECTOMY Right 10/18/2015   Procedure: RIGHT KNEE ARTHROSCOPY WITH LATERAL MENISECTOMY;  Surgeon: [name], MD;  Location: [facility];  Service: Orthopedics;  Laterality: Right;  . TONSILLECTOMY      Social History   Socioeconomic History  . Marital status: Married    Spouse name: Not on file  . Number of children: Not on file  . Years of education: Not on file  . Highest education level: Not on file  Occupational History  . Not on file  Tobacco Use  . Smoking status: Former Smoker    Packs/day: 0.10    Years: 0.25    Pack years: 0.02    Start date: 1976    Quit date: 1976    Years since quitting: 46.2  . Smokeless tobacco: Never Used  . Tobacco comment: a couple months when 19 then stopped  Vaping Use  . Vaping Use: Never used  Substance and Sexual Activity  . Alcohol use: No  . Drug use: No  . Sexual activity: Yes    Birth control/protection: Surgical  Other Topics Concern  . Not on file  Social History Narrative  . Not on file   Social Determinants of Health   Financial Resource Strain: Not on file  Food Insecurity: Not on file  Transportation Needs: Not on file  Physical Activity: Not on  file  Stress: Not on file  Social Connections: Not on file  Intimate Partner Violence: Not on file    Current Outpatient Medications on File Prior to Visit  Medication Sig Dispense Refill  . aspirin 81 MG chewable tablet Chew 81 mg by mouth at bedtime.     Marland Kitchen CALCIUM PO Take 1 tablet by mouth daily.    . cetirizine (ZYRTEC) 10 MG tablet Take 1 tablet (10 mg total) by mouth daily. 60 tablet 5  . Cholecalciferol (VITAMIN D3) 25 MCG (1000 UT) CAPS Take 2,000 Units by mouth 2 (two) times daily.    . famotidine (PEPCID) 20 MG tablet Take 20 mg by mouth 2 (two) times daily.     . furosemide (LASIX) 40 MG tablet Take 40 mg by mouth daily.   0  . hydrOXYzine HCl (ATARAX PO) Take by mouth as needed.     Marland Kitchen MAGNESIUM PO Take 1 tablet by mouth daily.    . meloxicam (MOBIC) 15 MG tablet Take 1 tablet by mouth once daily 90 tablet 0  . Omega-3 Fatty Acids (FISH OIL) 1000 MG CAPS Take 2,000 capsules by mouth daily.     . pantoprazole (PROTONIX) 40 MG tablet Take  40 mg by mouth daily.    . potassium chloride (KLOR-CON) 10 MEQ tablet Take 1 tablet (10 mEq total) by mouth daily. 90 tablet 3  . SUPER B COMPLEX/C PO Take by mouth.    . triamcinolone cream (KENALOG) 0.1 % Apply 1 application topically 2 (two) times daily.    . Turmeric 500 MG CAPS Take by mouth.    Marland Kitchen UNABLE TO FIND Take 2 tablets by mouth every morning. Med Name: [name]    . vitamin C (ASCORBIC ACID) 500 MG tablet Take 500 mg by mouth daily.    . Vitamins-Lipotropics (MEGA-CHOL PO) Take 4 capsules by mouth 2 (two) times daily with a meal. Takes Doterra EO mega fish oil    . Zinc 15 MG CAPS Take 15 mg by mouth daily.     Current Facility-Administered Medications on File Prior to Visit  Medication Dose Route Frequency Provider Last Rate Last Admin  . nortriptyline (PAMELOR) capsule 10 mg  10 mg Oral QHS [name], MD        Allergies  Allergen Reactions  . Atorvastatin Other (See Comments)    Myalgia; pt reports this  occurred in past, was prescribed by different doctor.  . Doxycycline     Shortness or breath one time another time had symptoms of heart attack  . Neurontin [Gabapentin] Rash    Family History  Problem Relation Age of Onset  . Stroke Mother   . Asthma Mother   . Atrial fibrillation Mother   . Leukemia Mother   . COPD Mother   . Cancer Father        lung  . Hyperlipidemia Brother   . Hyperlipidemia Brother   . Hyperlipidemia Brother     BP 132/70 (BP Location: Right Arm, Patient Position: Sitting, Cuff Size: Large)   Pulse (!) 56   Ht 5\' 8"  (1.727 m)   Wt (!) 306 lb (138.8 kg)   SpO2 98%   BMI 46.53 kg/m     Review of Systems denies easy bruising, depression, and rash on the abdomen.      Objective:   Physical Exam VS: see vs page GEN: no distress.  Morbid obesity HEAD: head: no deformity eyes: no periorbital swelling, no proptosis external nose and ears are normal NECK: supple, thyroid is not enlarged CHEST WALL: no deformity LUNGS: clear to auscultation CV: reg rate and rhythm, no murmur.  MUSCULOSKELETAL: gait is normal and steady EXTEMITIES: no deformity.  1+ bilat leg edema.   NEURO:  readily moves all 4's.  sensation is intact to touch on all 4's SKIN:  Normal texture and temperature.  No striae on the abdomen or flanks.   NODES:  None palpable at the neck PSYCH: alert, well-oriented.  Does not appear anxious nor depressed.  outside test results are reviewed: CT's (2022): adrenals are normal.  No mention is made of the pituitary.   Ca++=10.4 Renin=8 PM cortisol=19  I have reviewed outside records, and summarized: Pt was noted to have elevated cortisol, and referred here.  Subject of elev renin and hypercalcemia were also addressed      Assessment & Plan:  Hypercortisolemia: new to me, uncertain etiology and prognosis.  Hypercalcemia, new to me, uncertain etiology and prognosis.  Hyperreninemia, new to me: uncertain etiology and  prognosis   Patient Instructions  First, let's check a 24-HR urine test. Then, you would do a "suppression" test Day 1: Take 1 pill at in the afternoon, and another at bedtime  Day 2: take 1 pill, 4 times this day--spread them out as best you can Day 3: take 1 pill in the morning, and the last one at noon. then do the blood test at 4PM.   No need to adjust your meals for any of this.

## 2020-10-18 ENCOUNTER — Ambulatory Visit: Payer: 59 | Admitting: Gastroenterology

## 2020-10-18 ENCOUNTER — Telehealth: Payer: Self-pay | Admitting: Endocrinology

## 2020-10-18 NOTE — Telephone Encounter (Signed)
Patient called to find out where Dr Loanne Drilling wanted her to go for labs and 24 hour urine drop off.  She states was told to go the "lab at [facility] and [address]" in [place].  Please call patient to advise at [phone]

## 2020-10-19 ENCOUNTER — Other Ambulatory Visit: Payer: Self-pay | Admitting: Endocrinology

## 2020-10-19 NOTE — Telephone Encounter (Signed)
Pt stated --- [facility] in [place] do not have certain tube and requesting if can get done at the [facility]. Release the order to [place]-  [phone]

## 2020-10-19 NOTE — Addendum Note (Signed)
Addended by: Elta Guadeloupe on: 10/19/2020 12:06 PM   Modules accepted: Orders

## 2020-10-19 NOTE — Addendum Note (Signed)
Addended by: Elta Guadeloupe on: 10/19/2020 12:05 PM   Modules accepted: Orders

## 2020-10-19 NOTE — Telephone Encounter (Signed)
Notified pt have primary with [facility] in [place] and please call before arriving to the office.

## 2020-10-19 NOTE — Addendum Note (Signed)
Addended by: Elta Guadeloupe on: 10/19/2020 12:07 PM   Modules accepted: Orders

## 2020-10-24 ENCOUNTER — Other Ambulatory Visit: Payer: Self-pay

## 2020-10-26 LAB — SPECIMEN STATUS REPORT

## 2020-10-26 LAB — CORTISOL: Cortisol: 0.5 ug/dL

## 2020-10-31 ENCOUNTER — Encounter: Payer: Self-pay | Admitting: Gastroenterology

## 2020-10-31 ENCOUNTER — Other Ambulatory Visit: Payer: Self-pay

## 2020-10-31 ENCOUNTER — Ambulatory Visit (INDEPENDENT_AMBULATORY_CARE_PROVIDER_SITE_OTHER): Payer: 59 | Admitting: Gastroenterology

## 2020-10-31 DIAGNOSIS — Z1211 Encounter for screening for malignant neoplasm of colon: Secondary | ICD-10-CM

## 2020-10-31 NOTE — Progress Notes (Signed)
Primary Care Physician:  Redmond School, MD  Referring Physician: Dr. Gerarda Fraction Primary Gastroenterologist:  Dr. Abbey Chatters  Chief Complaint  Patient presents with  . Consult    TCS never done prior. No FH colon cancer/polyps    HPI:   Meagan Reyes is a 65 y.o. female presenting today at the request of [facility] for initial screening colonoscopy. No family history of colorectal cancer or polyps.  Notes no concerning lower or upper GI signs/symptoms. No overt GI bleeding. No dysphagia. Chronic GERD on PPI. No abdominal pain, N/V. No unexplained weight loss or lack of appetite. Chronic DOE, and she has been evaluated by Pulmonary. Improvement off ACE but still present. For about a week has had worsening LLE edema. Chronic bilateral lower extremity edema noted but now worsening. PCP appt tomorrow. No fever, chills. No chest pain. States she has gained weight. Feels puffy all over.   Past Medical History:  Diagnosis Date  . Chronic diastolic heart failure ([place])    patient denies  . GERD (gastroesophageal reflux disease)   . Hyperlipemia   . Hypertension     Past Surgical History:  Procedure Laterality Date  . ABDOMINAL HYSTERECTOMY    . BACK SURGERY    . HEEL SPUR EXCISION    . KNEE ARTHROSCOPY WITH LATERAL MENISECTOMY Right 10/18/2015   Procedure: RIGHT KNEE ARTHROSCOPY WITH LATERAL MENISECTOMY;  Surgeon: [name], MD;  Location: [facility];  Service: Orthopedics;  Laterality: Right;  . TONSILLECTOMY      Current Outpatient Medications  Medication Sig Dispense Refill  . aspirin 81 MG chewable tablet Chew 81 mg by mouth at bedtime.     Marland Kitchen CALCIUM PO Take 1 tablet by mouth daily.    . cetirizine (ZYRTEC) 10 MG tablet Take 1 tablet (10 mg total) by mouth daily. 60 tablet 5  . Cholecalciferol (VITAMIN D3) 25 MCG (1000 UT) CAPS Take 2,000 Units by mouth 2 (two) times daily.    Marland Kitchen dexamethasone (DECADRON) 0.5 MG tablet Take 1 tablet (0.5 mg total) by mouth 4 (four) times  daily. 8 tablet 0  . famotidine (PEPCID) 20 MG tablet Take 20 mg by mouth 2 (two) times daily.     . furosemide (LASIX) 40 MG tablet Take 40 mg by mouth daily.   0  . hydrOXYzine HCl (ATARAX PO) Take by mouth as needed.     Marland Kitchen MAGNESIUM PO Take 1 tablet by mouth daily.    . meloxicam (MOBIC) 15 MG tablet Take 1 tablet by mouth once daily 90 tablet 0  . Omega-3 Fatty Acids (FISH OIL) 1000 MG CAPS Take 2,000 capsules by mouth daily.     . pantoprazole (PROTONIX) 40 MG tablet Take 40 mg by mouth daily.    . potassium chloride (KLOR-CON) 10 MEQ tablet Take 1 tablet (10 mEq total) by mouth daily. 90 tablet 3  . SUPER B COMPLEX/C PO Take by mouth.    . triamcinolone cream (KENALOG) 0.1 % Apply 1 application topically 2 (two) times daily.    . Turmeric 500 MG CAPS Take by mouth.    Marland Kitchen UNABLE TO FIND Take 2 tablets by mouth every morning. Med Name: [name]    . vitamin C (ASCORBIC ACID) 500 MG tablet Take 500 mg by mouth daily.    . Vitamins-Lipotropics (MEGA-CHOL PO) Take 4 capsules by mouth 2 (two) times daily with a meal. Takes Doterra EO mega fish oil    . Zinc 15 MG CAPS Take  15 mg by mouth daily.     Current Facility-Administered Medications  Medication Dose Route Frequency Provider Last Rate Last Admin  . nortriptyline (PAMELOR) capsule 10 mg  10 mg Oral QHS [name], MD        Allergies as of 10/31/2020 - Review Complete 10/31/2020  Allergen Reaction Noted  . Atorvastatin Other (See Comments) 04/04/2019  . Doxycycline  05/03/2019  . Neurontin [gabapentin] Rash 07/12/2013    Family History  Problem Relation Age of Onset  . Stroke Mother   . Asthma Mother   . Atrial fibrillation Mother   . Leukemia Mother   . COPD Mother   . Cancer Father        lung  . Hyperlipidemia Brother   . Hyperlipidemia Brother   . Hyperlipidemia Brother   . Colon cancer Neg Hx   . Colon polyps Neg Hx     Social History   Socioeconomic History  . Marital status: Married     Spouse name: Not on file  . Number of children: Not on file  . Years of education: Not on file  . Highest education level: Not on file  Occupational History  . Not on file  Tobacco Use  . Smoking status: Former Smoker    Packs/day: 0.10    Years: 0.25    Pack years: 0.02    Start date: 1976    Quit date: 1976    Years since quitting: 46.3  . Smokeless tobacco: Never Used  . Tobacco comment: a couple months when 19 then stopped  Vaping Use  . Vaping Use: Never used  Substance and Sexual Activity  . Alcohol use: No  . Drug use: No  . Sexual activity: Yes    Birth control/protection: Surgical  Other Topics Concern  . Not on file  Social History Narrative  . Not on file   Social Determinants of Health   Financial Resource Strain: Not on file  Food Insecurity: Not on file  Transportation Needs: Not on file  Physical Activity: Not on file  Stress: Not on file  Social Connections: Not on file  Intimate Partner Violence: Not on file    Review of Systems: Gen: Denies any fever, chills, fatigue, weight loss, lack of appetite.  CV: Denies chest pain, heart palpitations, peripheral edema, syncope.  Resp: +DOE GI: Denies dysphagia or odynophagia. Denies jaundice, hematemesis, fecal incontinence. GU : Denies urinary burning, urinary frequency, urinary hesitancy MS: Denies joint pain, muscle weakness, cramps, or limitation of movement.  Derm: Denies rash, itching, dry skin Psych: Denies depression, anxiety, memory loss, and confusion Heme: Denies bruising, bleeding, and enlarged lymph nodes.  Physical Exam: BP (!) 142/70   Pulse 65   Temp (!) 97.1 F (36.2 C)   Ht 5\' 8"  (1.727 m)   Wt (!) 311 lb 9.6 oz (141.3 kg)   BMI 47.38 kg/m  General:   Alert and oriented. Pleasant and cooperative. Well-nourished and well-developed.  Head:  Normocephalic and atraumatic. Eyes:  Without icterus, sclera clear and conjunctiva pink.  Ears:  Normal auditory acuity. Mouth:  Mask in place   Lungs:  Clear to auscultation bilaterally. No wheezes, rales, or rhonchi. No distress.  Heart:  S1, S2 present without murmurs appreciated.  Abdomen:  +BS, soft, obese, non-tender and non-distended. No HSM noted. No guarding or rebound. No masses appreciated.  Rectal:  Deferred  Extremities:  Bilateral pedal and lower extremity edema, with left greater than right for at least a week,  left lower shin with erythema, query evolving cellulitis, present a week, no calf pain Neurologic:  Alert and  oriented x4;  grossly normal neurologically. Skin:  Intact without significant lesions or rashes. Psych:  Alert and cooperative. Normal mood and affect.  ASSESSMENT: Meagan Reyes is a 65 y.o. female presenting today with need for initial screening colonoscopy, denying any family history of colorectal cancer or polyps.   Currently, she is dealing with worsening lower extremity edema, possible left leg cellulitis, and generalized edema. She has an appt with PCP. I have asked her to call with updates. We will tentatively hold a spot in the future with plans for postponing if needed.    PLAN: Keep appt with PCP tomorrow If stable and no acute findings, will pursue colonoscopy by Dr. Abbey Chatters  in near future: the risks, benefits, and alternatives have been discussed with the patient in detail. The patient states understanding and desires to proceed.

## 2020-10-31 NOTE — Patient Instructions (Signed)
I am glad you are seeing your primary care doctor tomorrow! Please call us with any significant health updates.  We will hold a spot tentatively with Dr. Abbey Chatters in the near future!  It was a pleasure to see you today. I want to create trusting relationships with patients to provide genuine, compassionate, and quality care. I value your feedback. If you receive a survey regarding your visit,  I greatly appreciate you taking time to fill this out.   Annitta Needs, PhD, ANP-BC [facility]

## 2020-11-01 ENCOUNTER — Ambulatory Visit (HOSPITAL_COMMUNITY)
Admission: RE | Admit: 2020-11-01 | Discharge: 2020-11-01 | Disposition: A | Discharge status: Home / Self Care | Payer: 59 | Source: Ambulatory Visit | Attending: Internal Medicine | Admitting: Internal Medicine

## 2020-11-01 ENCOUNTER — Other Ambulatory Visit: Payer: Self-pay | Admitting: Internal Medicine

## 2020-11-01 DIAGNOSIS — M79604 Pain in right leg: Secondary | ICD-10-CM | POA: Diagnosis not present

## 2020-11-01 DIAGNOSIS — M79605 Pain in left leg: Secondary | ICD-10-CM | POA: Diagnosis present

## 2020-11-01 DIAGNOSIS — R600 Localized edema: Secondary | ICD-10-CM

## 2020-11-06 ENCOUNTER — Telehealth: Payer: Self-pay | Admitting: Internal Medicine

## 2020-11-06 LAB — CORTISOL, URINE, FREE
Cortisol (Ur), Free: 4 ug/24 hr — ABNORMAL LOW (ref 6–42)
Cortisol,F,ug/L,U: 2 ug/L

## 2020-11-06 LAB — BASIC METABOLIC PANEL (7)
BUN/Creatinine Ratio: 22 (ref 12–28)
BUN: 27 mg/dL (ref 8–27)
CO2: 28 mmol/L (ref 20–29)
Chloride: 97 mmol/L (ref 96–106)
Creatinine, Ser: 1.21 mg/dL — ABNORMAL HIGH (ref 0.57–1.00)
Glucose: 152 mg/dL — ABNORMAL HIGH (ref 65–99)
Potassium: 2.9 mmol/L — ABNORMAL LOW (ref 3.5–5.2)
Sodium: 142 mmol/L (ref 134–144)
eGFR: 50 mL/min/{1.73_m2} — ABNORMAL LOW (ref >59)

## 2020-11-06 LAB — VITAMIN D 25 HYDROXY (VIT D DEFICIENCY, FRACTURES): Vit D, 25-Hydroxy: 38 ng/mL (ref 30.0–100.0)

## 2020-11-06 LAB — ALDOSTERONE + RENIN ACTIVITY W/ RATIO
ALDOS/RENIN RATIO: 0.8 (ref 0.0–30.0)
ALDOSTERONE: 3.4 ng/dL (ref 0.0–30.0)
Renin: 4.513 ng/mL/hr (ref 0.167–5.380)

## 2020-11-06 LAB — PTH, INTACT AND CALCIUM
Calcium: 9.3 mg/dL (ref 8.7–10.3)
PTH: 49 pg/mL (ref 15–65)

## 2020-11-06 NOTE — Telephone Encounter (Signed)
Patient was seen by Dr. Nolon Rod office for lower extremity edema and SOB, who ordered a US DVT. It has not been resulted yet.   Patient wanted to be seen sooner than June, so patient was scheduled with B. Strader, PA-C for Nov 22, 2020 due to SOB. Patient also c/o dizziness when she bends over. Patient denies CP and pressure. Patient states that she takes all of her medications on a regular basis everyday.

## 2020-11-06 NOTE — Telephone Encounter (Signed)
  1. Are you currently SOB (can you hear that pt is SOB on the phone)? yes  2. How long have you been experiencing SOB?  For 2 weeks now.   3. Are you SOB when sitting or when up moving around? Moving   4. Are you currently experiencing any other symptoms? Seen by PCP 4-14 for swelling of her legs and shortness of breath.  (Dr. Gerarda Fraction)  States that they did labs and [place] on her legs.

## 2020-11-06 NOTE — Telephone Encounter (Signed)
I think that is OK with [place] She will have scan that Dr Nolon Rod ordered Please get note/labs from Dr Nolon Rod office

## 2020-11-06 NOTE — Telephone Encounter (Signed)
Labs and office note requested from [facility] (Dr. Nolon Rod office).

## 2020-11-19 ENCOUNTER — Encounter (HOSPITAL_COMMUNITY): Payer: Self-pay

## 2020-11-19 ENCOUNTER — Emergency Department (HOSPITAL_COMMUNITY): Payer: 59

## 2020-11-19 ENCOUNTER — Observation Stay (HOSPITAL_COMMUNITY)
Admission: EM | Admit: 2020-11-19 | Discharge: 2020-11-20 | Disposition: A | Discharge status: Home / Self Care | Payer: 59 | Attending: Family Medicine | Admitting: Family Medicine

## 2020-11-19 ENCOUNTER — Other Ambulatory Visit: Payer: Self-pay

## 2020-11-19 ENCOUNTER — Other Ambulatory Visit (HOSPITAL_COMMUNITY): Payer: Self-pay | Admitting: Internal Medicine

## 2020-11-19 ENCOUNTER — Observation Stay (HOSPITAL_BASED_OUTPATIENT_CLINIC_OR_DEPARTMENT_OTHER): Payer: 59

## 2020-11-19 DIAGNOSIS — E1165 Type 2 diabetes mellitus with hyperglycemia: Secondary | ICD-10-CM

## 2020-11-19 DIAGNOSIS — E876 Hypokalemia: Secondary | ICD-10-CM | POA: Diagnosis not present

## 2020-11-19 DIAGNOSIS — E7801 Familial hypercholesterolemia: Secondary | ICD-10-CM

## 2020-11-19 DIAGNOSIS — Z20822 Contact with and (suspected) exposure to covid-19: Secondary | ICD-10-CM | POA: Insufficient documentation

## 2020-11-19 DIAGNOSIS — Z79899 Other long term (current) drug therapy: Secondary | ICD-10-CM | POA: Insufficient documentation

## 2020-11-19 DIAGNOSIS — Z7982 Long term (current) use of aspirin: Secondary | ICD-10-CM | POA: Diagnosis not present

## 2020-11-19 DIAGNOSIS — Z87891 Personal history of nicotine dependence: Secondary | ICD-10-CM | POA: Insufficient documentation

## 2020-11-19 DIAGNOSIS — I110 Hypertensive heart disease with heart failure: Secondary | ICD-10-CM | POA: Diagnosis not present

## 2020-11-19 DIAGNOSIS — I5032 Chronic diastolic (congestive) heart failure: Secondary | ICD-10-CM | POA: Insufficient documentation

## 2020-11-19 DIAGNOSIS — R0602 Shortness of breath: Secondary | ICD-10-CM

## 2020-11-19 DIAGNOSIS — E781 Pure hyperglyceridemia: Secondary | ICD-10-CM

## 2020-11-19 DIAGNOSIS — R0600 Dyspnea, unspecified: Secondary | ICD-10-CM | POA: Diagnosis not present

## 2020-11-19 DIAGNOSIS — N179 Acute kidney failure, unspecified: Secondary | ICD-10-CM

## 2020-11-19 DIAGNOSIS — I10 Essential (primary) hypertension: Secondary | ICD-10-CM | POA: Diagnosis present

## 2020-11-19 DIAGNOSIS — I517 Cardiomegaly: Secondary | ICD-10-CM

## 2020-11-19 DIAGNOSIS — R001 Bradycardia, unspecified: Secondary | ICD-10-CM | POA: Diagnosis not present

## 2020-11-19 DIAGNOSIS — R0609 Other forms of dyspnea: Secondary | ICD-10-CM | POA: Diagnosis present

## 2020-11-19 DIAGNOSIS — G8929 Other chronic pain: Secondary | ICD-10-CM

## 2020-11-19 DIAGNOSIS — I872 Venous insufficiency (chronic) (peripheral): Secondary | ICD-10-CM

## 2020-11-19 DIAGNOSIS — R739 Hyperglycemia, unspecified: Secondary | ICD-10-CM | POA: Diagnosis present

## 2020-11-19 DIAGNOSIS — K219 Gastro-esophageal reflux disease without esophagitis: Secondary | ICD-10-CM | POA: Diagnosis present

## 2020-11-19 DIAGNOSIS — G4733 Obstructive sleep apnea (adult) (pediatric): Secondary | ICD-10-CM

## 2020-11-19 LAB — RESP PANEL BY RT-PCR (FLU A&B, COVID) ARPGX2
Influenza A by PCR: NEGATIVE
Influenza B by PCR: NEGATIVE
SARS Coronavirus 2 by RT PCR: NEGATIVE

## 2020-11-19 LAB — CBC
HCT: 38.4 % (ref 36.0–46.0)
Hemoglobin: 12.7 g/dL (ref 12.0–15.0)
MCH: 29.4 pg (ref 26.0–34.0)
MCHC: 33.1 g/dL (ref 30.0–36.0)
MCV: 88.9 fL (ref 80.0–100.0)
Platelets: 237 10*3/uL (ref 150–400)
RBC: 4.32 MIL/uL (ref 3.87–5.11)
RDW: 15.9 % — ABNORMAL HIGH (ref 11.5–15.5)
WBC: 10.1 10*3/uL (ref 4.0–10.5)
nRBC: 0 % (ref 0.0–0.2)

## 2020-11-19 LAB — ECHOCARDIOGRAM COMPLETE
AR max vel: 2.31 cm2
AV Area VTI: 2.44 cm2
AV Area mean vel: 2.27 cm2
AV Mean grad: 4.7 mmHg
AV Peak grad: 9.9 mmHg
Ao pk vel: 1.57 m/s
Area-P 1/2: 2.78 cm2
Height: 68 in
S' Lateral: 2.57 cm
Weight: 4828.96 [oz_av]

## 2020-11-19 LAB — COMPREHENSIVE METABOLIC PANEL
ALT: 35 U/L (ref 0–44)
AST: 32 U/L (ref 15–41)
Albumin: 3.8 g/dL (ref 3.5–5.0)
Alkaline Phosphatase: 92 U/L (ref 38–126)
Anion gap: 12 (ref 5–15)
BUN: 32 mg/dL — ABNORMAL HIGH (ref 8–23)
CO2: 32 mmol/L (ref 22–32)
Calcium: 9.4 mg/dL (ref 8.9–10.3)
Chloride: 95 mmol/L — ABNORMAL LOW (ref 98–111)
Creatinine, Ser: 1.03 mg/dL — ABNORMAL HIGH (ref 0.44–1.00)
GFR, Estimated: >60 mL/min (ref >60)
Glucose, Bld: 156 mg/dL — ABNORMAL HIGH (ref 70–99)
Potassium: 2.8 mmol/L — ABNORMAL LOW (ref 3.5–5.1)
Sodium: 139 mmol/L (ref 135–145)
Total Bilirubin: 0.6 mg/dL (ref 0.3–1.2)
Total Protein: 7 g/dL (ref 6.5–8.1)

## 2020-11-19 LAB — HEMOGLOBIN A1C
Hgb A1c MFr Bld: 6.6 % — ABNORMAL HIGH (ref 4.8–5.6)
Mean Plasma Glucose: 142.72 mg/dL

## 2020-11-19 LAB — TROPONIN I (HIGH SENSITIVITY)
Troponin I (High Sensitivity): 8 ng/L (ref <18)
Troponin I (High Sensitivity): 7 ng/L (ref <18)

## 2020-11-19 LAB — HIV ANTIBODY (ROUTINE TESTING W REFLEX): HIV Screen 4th Generation wRfx: NONREACTIVE

## 2020-11-19 LAB — VITAMIN D 25 HYDROXY (VIT D DEFICIENCY, FRACTURES): Vit D, 25-Hydroxy: 34.16 ng/mL (ref 30–100)

## 2020-11-19 LAB — TSH: TSH: 1.346 u[IU]/mL (ref 0.350–4.500)

## 2020-11-19 LAB — PROTIME-INR
INR: 0.9 (ref 0.8–1.2)
Prothrombin Time: 12.5 seconds (ref 11.4–15.2)

## 2020-11-19 LAB — MAGNESIUM: Magnesium: 1.9 mg/dL (ref 1.7–2.4)

## 2020-11-19 LAB — BRAIN NATRIURETIC PEPTIDE: B Natriuretic Peptide: 31 pg/mL (ref 0.0–100.0)

## 2020-11-19 MED ORDER — VITAMIN D 25 MCG (1000 UNIT) PO TABS
2000.0000 [IU] | ORAL_TABLET | Freq: Two times a day (BID) | ORAL | Status: DC
  Administered 2020-11-19 – 2020-11-20 (×2): 2000 [IU] via ORAL
  Filled 2020-11-19 (×2): qty 2

## 2020-11-19 MED ORDER — PANTOPRAZOLE SODIUM 40 MG PO TBEC
40.0000 mg | DELAYED_RELEASE_TABLET | Freq: Every day | ORAL | Status: DC
  Administered 2020-11-19 – 2020-11-20 (×2): 40 mg via ORAL
  Filled 2020-11-19 (×2): qty 1

## 2020-11-19 MED ORDER — OXYCODONE HCL 5 MG PO TABS: 5.0000 mg | ORAL_TABLET | ORAL | Status: DC | PRN

## 2020-11-19 MED ORDER — NORTRIPTYLINE HCL 10 MG PO CAPS
10.0000 mg | ORAL_CAPSULE | Freq: Every day | ORAL | Status: DC
  Administered 2020-11-19: 10 mg via ORAL
  Filled 2020-11-19 (×3): qty 1

## 2020-11-19 MED ORDER — POTASSIUM CHLORIDE IN NACL 20-0.9 MEQ/L-% IV SOLN: INTRAVENOUS | Status: DC

## 2020-11-19 MED ORDER — ASPIRIN 81 MG PO CHEW
81.0000 mg | CHEWABLE_TABLET | Freq: Every day | ORAL | Status: DC
  Administered 2020-11-19: 81 mg via ORAL
  Filled 2020-11-19: qty 1

## 2020-11-19 MED ORDER — ZOLPIDEM TARTRATE 5 MG PO TABS
5.0000 mg | ORAL_TABLET | Freq: Every evening | ORAL | Status: DC | PRN
  Administered 2020-11-19: 5 mg via ORAL
  Filled 2020-11-19: qty 1

## 2020-11-19 MED ORDER — ENOXAPARIN SODIUM 40 MG/0.4ML IJ SOSY: 40.0000 mg | PREFILLED_SYRINGE | INTRAMUSCULAR | Status: DC

## 2020-11-19 MED ORDER — ACETAMINOPHEN 325 MG PO TABS
650.0000 mg | ORAL_TABLET | Freq: Four times a day (QID) | ORAL | Status: DC | PRN
  Administered 2020-11-19: 650 mg via ORAL
  Filled 2020-11-19: qty 2

## 2020-11-19 MED ORDER — POTASSIUM CHLORIDE CRYS ER 20 MEQ PO TBCR
40.0000 meq | EXTENDED_RELEASE_TABLET | Freq: Once | ORAL | Status: AC
  Administered 2020-11-19: 40 meq via ORAL
  Filled 2020-11-19: qty 2

## 2020-11-19 MED ORDER — ENOXAPARIN SODIUM 80 MG/0.8ML IJ SOSY
70.0000 mg | PREFILLED_SYRINGE | INTRAMUSCULAR | Status: DC
  Administered 2020-11-19: 70 mg via SUBCUTANEOUS
  Filled 2020-11-19: qty 0.8

## 2020-11-19 MED ORDER — ACETAMINOPHEN 650 MG RE SUPP: 650.0000 mg | Freq: Four times a day (QID) | RECTAL | Status: DC | PRN

## 2020-11-19 MED ORDER — ONDANSETRON HCL 4 MG PO TABS: 4.0000 mg | ORAL_TABLET | Freq: Four times a day (QID) | ORAL | Status: DC | PRN

## 2020-11-19 MED ORDER — IOHEXOL 350 MG/ML SOLN
100.0000 mL | Freq: Once | INTRAVENOUS | Status: AC | PRN
  Administered 2020-11-19: 100 mL via INTRAVENOUS

## 2020-11-19 MED ORDER — BISACODYL 5 MG PO TBEC: 5.0000 mg | DELAYED_RELEASE_TABLET | Freq: Every day | ORAL | Status: DC | PRN

## 2020-11-19 MED ORDER — POTASSIUM CHLORIDE 10 MEQ/100ML IV SOLN
10.0000 meq | Freq: Once | INTRAVENOUS | Status: AC
  Administered 2020-11-19: 10 meq via INTRAVENOUS
  Filled 2020-11-19: qty 100

## 2020-11-19 MED ORDER — ONDANSETRON HCL 4 MG/2ML IJ SOLN: 4.0000 mg | Freq: Four times a day (QID) | INTRAMUSCULAR | Status: DC | PRN

## 2020-11-19 MED ORDER — HYDRALAZINE HCL 20 MG/ML IJ SOLN: 10.0000 mg | INTRAMUSCULAR | Status: DC | PRN

## 2020-11-19 NOTE — ED Notes (Signed)
Patient ambulated in hallway with no change in VS.

## 2020-11-19 NOTE — ED Notes (Signed)
Patient returned to room at this time from CT.

## 2020-11-19 NOTE — ED Notes (Signed)
Patient to CT at this time

## 2020-11-19 NOTE — Progress Notes (Signed)
  Echocardiogram 2D Echocardiogram has been performed.  Bakari Nikolai G Renu Asby 11/19/2020, 4:00 PM

## 2020-11-19 NOTE — H&P (Signed)
History and Physical  [facility]  DANITA PROUD ZYS:[id number] DOB: Nov 25, 1955 DOA: 11/19/2020  PCP: Redmond School, MD  Patient coming from: Home by [facility]  Level of care: Telemetry  I have personally briefly reviewed patient's old medical records in [place]  Chief Complaint: shortness of breath   HPI: Meagan Reyes is a 65 y.o. female with medical history significant for hypertension, morbid obesity, GERD, diastolic heart failure, hyperlipidemia who reportedly had been taken off lisinopril several months ago for symptoms of dyspnea and had been placed on labetalol and diltiazem, lasix and HCTZ for reported difficult to control blood pressure. She is actually taking 400 mg labetalol twice daily, 180 mg cardizem CD.  She reports that for the past year she has been having symptoms of shortness of breath. She has been having outpatient work up for this but has been unrevealing.  She reports that her SOB became worse today.  She has SOB with ambulation.  She denies chest pain.  She reports that ambulation does make SOB worse.  She called EMS and they found that her HR was in the 40s.  She was 98% on 4L [place].  She also reports that she has been dealing with leg swelling.  She had a recent venous ultrasound of the left leg that was negative for DVT.   ED Course: She had a pretty thorough work-up in the emergency department for dyspnea.  She never desaturated.  She is 98% on room air.  Her heart rate was low at 54.  Blood pressures 132/56.  She was afebrile.  Sodium 139, potassium 2.8, glucose 156, creatinine 1.03, BUN 32.  Cardiac BNP 31.0, high-sensitivity troponin 8.  WBC 10.1, hemoglobin 12.7, platelets 237.  Portable chest x-ray no acute findings.  CTA chest with no findings of pulmonary embolism cardiomegaly and groundglass appearance.  Venous ultrasound negative for DVT.  Respiratory panel still pending.  Admission was requested for further evaluation symptomatic bradycardia and  dyspnea on exertion.  Review of Systems: Review of Systems  Constitutional: Positive for malaise/fatigue. Negative for chills, fever and weight loss.  HENT: Negative.   Eyes: Negative.   Respiratory: Positive for shortness of breath.   Cardiovascular: Negative.   Gastrointestinal: Negative.   Genitourinary: Negative.   Musculoskeletal: Negative.   Skin: Negative.   Neurological: Positive for dizziness and weakness.  Endo/Heme/Allergies: Negative.   Psychiatric/Behavioral: Negative.   All other systems reviewed and are negative.    Past Medical History:  Diagnosis Date  . Chronic diastolic heart failure ([place])    patient denies  . GERD (gastroesophageal reflux disease)   . Hyperlipemia   . Hypertension     Past Surgical History:  Procedure Laterality Date  . ABDOMINAL HYSTERECTOMY    . BACK SURGERY    . HEEL SPUR EXCISION    . KNEE ARTHROSCOPY WITH LATERAL MENISECTOMY Right 10/18/2015   Procedure: RIGHT KNEE ARTHROSCOPY WITH LATERAL MENISECTOMY;  Surgeon: [name], MD;  Location: [facility];  Service: Orthopedics;  Laterality: Right;  . TONSILLECTOMY       reports that she quit smoking about 46 years ago. She started smoking about 46 years ago. She has a 0.03 pack-year smoking history. She has never used smokeless tobacco. She reports that she does not drink alcohol and does not use drugs.  Allergies  Allergen Reactions  . Atorvastatin Other (See Comments)    Myalgia; pt reports this occurred in past, was prescribed by different doctor.  . Doxycycline  Shortness or breath one time another time had symptoms of heart attack  . Neurontin [Gabapentin] Rash    Family History  Problem Relation Age of Onset  . Stroke Mother   . Asthma Mother   . Atrial fibrillation Mother   . Leukemia Mother   . COPD Mother   . Cancer Father        lung  . Hyperlipidemia Brother   . Hyperlipidemia Brother   . Hyperlipidemia Brother   . Colon cancer Neg Hx   . Colon polyps  Neg Hx     Prior to Admission medications   Medication Sig Start Date End Date Taking? Authorizing Provider  aspirin 81 MG chewable tablet Chew 81 mg by mouth at bedtime.    Yes [provider]  Cholecalciferol (VITAMIN D3) 25 MCG (1000 UT) CAPS Take 2,000 Units by mouth 2 (two) times daily.   Yes [provider]  diltiazem (CARDIZEM LA) 180 MG 24 hr tablet Take 180 mg by mouth at bedtime. 10/22/20  Yes [provider]  furosemide (LASIX) 40 MG tablet Take 40 mg by mouth 2 (two) times daily. 04/15/18  Yes [provider]  hydrochlorothiazide (HYDRODIURIL) 50 MG tablet Take 50 mg by mouth daily. 11/11/20  Yes [provider]  labetalol (NORMODYNE) 100 MG tablet Take 100 mg by mouth 2 (two) times daily. 10/22/20  Yes [provider]  labetalol (NORMODYNE) 300 MG tablet Take 300 mg by mouth 2 (two) times daily. 11/08/20  Yes [provider]  meloxicam (MOBIC) 15 MG tablet Take 1 tablet by mouth once daily 08/28/20  Yes [name], MD  pantoprazole (PROTONIX) 40 MG tablet Take 40 mg by mouth daily.   Yes [provider]  potassium chloride (KLOR-CON) 10 MEQ tablet Take 1 tablet (10 mEq total) by mouth daily. 06/07/19  Yes Fay Records, MD  SUPER B COMPLEX/C PO Take 1 tablet by mouth daily.   Yes [provider]  vitamin C (ASCORBIC ACID) 500 MG tablet Take 500 mg by mouth daily.   Yes [provider]  Zinc 15 MG CAPS Take 15 mg by mouth daily.   Yes [provider]  cetirizine (ZYRTEC) 10 MG tablet Take 1 tablet (10 mg total) by mouth daily. Patient not taking: Reported on 11/19/2020 06/16/18   Valentina Shaggy, MD  dexamethasone (DECADRON) 0.5 MG tablet Take 1 tablet (0.5 mg total) by mouth 4 (four) times daily. Patient not taking: No sig reported 10/15/20   Renato Shin, MD    Physical Exam: Vitals:   11/19/20 1215 11/19/20 1230 11/19/20 1330 11/19/20 1400  BP:   140/68 131/61  Pulse: (!)  50 (!) 59 (!) 55   Resp: 17 17 18 18   Temp:      TempSrc:      SpO2: 98% 97% 98% 98%  Weight:      Height:        Constitutional: NAD, calm, comfortable Eyes: PERRL, lids and conjunctivae normal ENMT: Mucous membranes are moist. Posterior pharynx clear of any exudate or lesions.Normal dentition.  Neck: normal, supple, no masses, no thyromegaly Respiratory: clear to auscultation bilaterally, no wheezing, no crackles. Normal respiratory effort. No accessory muscle use.  Cardiovascular: normal s1, s2 sounds, no murmurs / rubs / gallops. No extremity edema. 2+ pedal pulses. No carotid bruits.  Abdomen: no tenderness, no masses palpated. No hepatosplenomegaly. Bowel sounds positive.  Musculoskeletal: no clubbing / cyanosis. No joint deformity upper and lower extremities. Good  ROM, no contractures. Normal muscle tone.  Skin: no rashes, lesions, ulcers. No induration Neurologic: CN 2-12 grossly intact. Sensation intact, DTR normal. Strength 5/5 in all 4.  Psychiatric: Normal judgment and insight. Alert and oriented x 3. Normal mood.   Labs on Admission: I have personally reviewed following labs and imaging studies  CBC: Recent Labs  Lab 11/19/20 0701  WBC 10.1  HGB 12.7  HCT 38.4  MCV 88.9  PLT 716   Basic Metabolic Panel: Recent Labs  Lab 11/19/20 0701  NA 139  K 2.8*  CL 95*  CO2 32  GLUCOSE 156*  BUN 32*  CREATININE 1.03*  CALCIUM 9.4  MG 1.9   GFR: Estimated Creatinine Clearance: 83.8 mL/min (A) (by C-G formula based on SCr of 1.03 mg/dL (H)). Liver Function Tests: Recent Labs  Lab 11/19/20 0701  AST 32  ALT 35  ALKPHOS 92  BILITOT 0.6  PROT 7.0  ALBUMIN 3.8   No results for input(s): LIPASE, AMYLASE in the last 168 hours. No results for input(s): AMMONIA in the last 168 hours. Coagulation Profile: Recent Labs  Lab 11/19/20 0701  INR 0.9   Cardiac Enzymes: No results for input(s): CKTOTAL, CKMB, CKMBINDEX, TROPONINI in the last 168 hours. BNP (last 3  results) No results for input(s): PROBNP in the last 8760 hours. HbA1C: No results for input(s): HGBA1C in the last 72 hours. CBG: No results for input(s): GLUCAP in the last 168 hours. Lipid Profile: No results for input(s): CHOL, HDL, LDLCALC, TRIG, CHOLHDL, LDLDIRECT in the last 72 hours. Thyroid Function Tests: No results for input(s): TSH, T4TOTAL, FREET4, T3FREE, THYROIDAB in the last 72 hours. Anemia Panel: No results for input(s): VITAMINB12, FOLATE, FERRITIN, TIBC, IRON, RETICCTPCT in the last 72 hours. Urine analysis: No results found for: COLORURINE, APPEARANCEUR, LABSPEC, PHURINE, GLUCOSEU, HGBUR, BILIRUBINUR, KETONESUR, PROTEINUR, UROBILINOGEN, NITRITE, LEUKOCYTESUR  Radiological Exams on Admission: CT Angio Chest PE W/Cm &/Or Wo Cm  Result Date: 11/19/2020 CLINICAL DATA:  Shortness of breath EXAM: CT ANGIOGRAPHY CHEST WITH CONTRAST TECHNIQUE: Multidetector CT imaging of the chest was performed using the standard protocol during bolus administration of intravenous contrast. Multiplanar CT image reconstructions and MIPs were obtained to evaluate the vascular anatomy. CONTRAST:  181mL OMNIPAQUE IOHEXOL 350 MG/ML SOLN COMPARISON:  03/25/2019 FINDINGS: Cardiovascular: Satisfactory opacification of the pulmonary arteries to the segmental level. No evidence of pulmonary embolism. New mild cardiomegaly. No pericardial effusion. Mediastinum/Nodes: No enlarged lymph nodes. Thyroid and esophagus are unremarkable. Very small hiatal hernia. Lungs/Pleura: No pleural effusion or pneumothorax. There is patchy bilateral ground-glass density. Upper Abdomen: No acute abnormality. Musculoskeletal: No acute osseous abnormality. Review of the MIP images confirms the above findings. IMPRESSION: No evidence acute pulmonary embolism. Patchy bilateral ground-glass density. May reflect edema given new mild cardiomegaly. Electronically Signed   By: Macy Mis M.D.   On: 11/19/2020 12:17   US Venous Img  Lower  Left (DVT Study)  Result Date: 11/19/2020 CLINICAL DATA:  Calf pain and edema. EXAM: LEFT LOWER EXTREMITY VENOUS DUPLEX ULTRASOUND TECHNIQUE: Gray-scale sonography with graded compression, as well as color Doppler and duplex ultrasound were performed to evaluate the left lower extremity deep venous system from the level of the common femoral vein and including the common femoral, femoral, profunda femoral, popliteal and calf veins including the posterior tibial, peroneal and gastrocnemius veins when visible. The superficial great saphenous vein was also interrogated. Spectral Doppler was utilized to evaluate flow at rest and with distal augmentation maneuvers in the common femoral,  femoral and popliteal veins. COMPARISON:  Bilateral lower extremity venous duplex ultrasound November 01, 2020 FINDINGS: Contralateral Common Femoral Vein: Respiratory phasicity is normal and symmetric with the symptomatic side. No evidence of thrombus. Normal compressibility. Common Femoral Vein: No evidence of thrombus. Normal compressibility, respiratory phasicity and response to augmentation. Saphenofemoral Junction: No evidence of thrombus. Normal compressibility and flow on color Doppler imaging. Profunda Femoral Vein: No evidence of thrombus. Normal compressibility and flow on color Doppler imaging. Femoral Vein: No evidence of thrombus. Normal compressibility, respiratory phasicity and response to augmentation. Popliteal Vein: No evidence of thrombus. Normal compressibility, respiratory phasicity and response to augmentation. Calf Veins: No evidence of thrombus. Normal compressibility and flow on color Doppler imaging. Superficial Great Saphenous Vein: No evidence of thrombus. Normal compressibility. Venous Reflux:  None. Other Findings: No lesion seen in the lateral left calf region at site of apparent palpable fullness. IMPRESSION: No evidence of deep venous thrombosis in the left lower extremity. Right common femoral vein  patent. No mass or fluid evident in the lateral left calf region by ultrasound. Electronically Signed   By: Lowella Grip III M.D.   On: 11/19/2020 10:53   DG Chest Port 1 View  Result Date: 11/19/2020 CLINICAL DATA:  65 year old female with history of shortness of breath worsening today. EXAM: PORTABLE CHEST 1 VIEW COMPARISON:  Chest x-ray 08/23/2019. FINDINGS: Lung volumes are low. No consolidative airspace disease. No pleural effusions. No pneumothorax. No pulmonary nodule or mass noted. Pulmonary vasculature and the cardiomediastinal silhouette are within normal limits. IMPRESSION: 1. Low lung volumes without radiographic evidence of acute cardiopulmonary disease. Electronically Signed   By: Vinnie Langton M.D.   On: 11/19/2020 07:55   EKG: Independently reviewed. Sinus bradycardia.   Assessment/Plan Principal Problem:   Hypokalemia Active Problems:   DOE (dyspnea on exertion)   Morbid obesity due to excess calories (HCC)   Chronic diastolic heart failure (HCC)   Essential hypertension   GERD (gastroesophageal reflux disease)   Sinus bradycardia   AKI (acute kidney injury) ([place])   Hyperglycemia   1. Dyspnea on exertion - probably multifactorial given morbid obesity, bradycardia, diastolic heart failure -extensive work-up in the emergency department negative for any acute findings.  We will work on improving bradycardia by holding rate limiting medications and obtain a 2D echocardiogram. 2. Diastolic heart failure- with new symptoms of DOE check a 2D echocardiogram to evaluate reported cardiomegaly.  3. Hypokalemia-likely secondary to Lasix and HCTZ which are temporarily on hold.  Oral and IV replacement ordered.  Follow magnesium. 4. Hyperglycemia-no reported history of diabetes mellitus check hemoglobin A1c. 5. AKI-likely prerenal from diuretic use hold diuretics temporarily and gently hydrate with IV fluids, recheck renal function in a.m. 6. Essential hypertension- blood  pressures soft to low at this point with bradycardia, follow blood pressures and reducing home blood pressure medications due to side effect of bradycardia. 7. GERD-Protonix ordered for GI protection.   DVT prophylaxis: Enoxaparin Code Status: Full Family Communication: Plan of care discussed with patient verbalizes understanding Disposition Plan: Home Consults called: PT Admission status: OBV Level of care: Telemetry Irwin Brakeman MD [facility] How to contact the [facility] Attending or Consulting provider [place] or covering provider during after hours [place], for this patient?  1. Check the care team in [facility] and look for a) attending/consulting [facility] provider listed and b) the [facility] team listed 2. Log into [email or web address] and use [facility]'s universal password to access. If you do not have the password,  please contact the hospital operator. 3. Locate the [facility] provider you are looking for under [facility] and page to a number that you can be directly reached. 4. If you still have difficulty reaching the provider, please page the [facility] (Director on Call) for the Hospitalists listed on amion for assistance.   If 7PM-7AM, please contact night-coverage [email or web address] Password [facility]  11/19/2020, 2:14 PM

## 2020-11-19 NOTE — ED Provider Notes (Signed)
[facility] EMERGENCY DEPARTMENT Provider Note   CSN: [id number] Arrival date & time: 11/19/20  0647     History Chief Complaint  Patient presents with  . Shortness of Breath    Meagan Reyes is a 65 y.o. female.  Patient presenting with a complaint of shortness of breath is been ongoing for a while.  But its been gotten much worse with ambulation and patient feels lightheaded feels like she may pass out.  Patient has a history of hypertension is on hydrochlorothiazide Lasix and diltiazem.  Reportedly heart rate was in the 40s at home oxygen saturations was in the low 90s patient was started on 4 L by EMS.  Patient's past medical history sniffing for hypertension hyperlipidemia chronic diastolic heart failure.  And reflux disease.  Patient is also concerned about some left leg swelling.  And she thinks there is some nodules there.  1 to 2 weeks ago she had Doppler studies of that leg that were negative.  But she states that the leg swelling is worse.        Past Medical History:  Diagnosis Date  . Chronic diastolic heart failure ([place])    patient denies  . GERD (gastroesophageal reflux disease)   . Hyperlipemia   . Hypertension     Patient Active Problem List   Diagnosis Date Noted  . Hypokalemia 11/19/2020  . GERD (gastroesophageal reflux disease)   . Sinus bradycardia   . AKI (acute kidney injury) ([place])   . Hyperglycemia   . Encounter for screening colonoscopy 10/31/2020  . 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 Hypercalcemia 10/15/2020  . Lump of skin of lower extremity 10/09/2020  . Polyarthritis 06/07/2020  . Essential hypertension 04/24/2020  . Chronic diastolic heart failure ([place]) 05/29/2019  . DOE (dyspnea on exertion) 05/06/2019  . Morbid obesity due to excess calories ([place]) 05/06/2019  . Lateral meniscus tear   . Chondromalacia, patella   . Arthritis of right knee     Past Surgical History:  Procedure Laterality Date   . ABDOMINAL HYSTERECTOMY    . BACK SURGERY    . HEEL SPUR EXCISION    . KNEE ARTHROSCOPY WITH LATERAL MENISECTOMY Right 10/18/2015   Procedure: RIGHT KNEE ARTHROSCOPY WITH LATERAL MENISECTOMY;  Surgeon: [name], MD;  Location: [facility];  Service: Orthopedics;  Laterality: Right;  . TONSILLECTOMY       OB History   No obstetric history on file.     Family History  Problem Relation Age of Onset  . Stroke Mother   . Asthma Mother   . Atrial fibrillation Mother   . Leukemia Mother   . COPD Mother   . Cancer Father        lung  . Hyperlipidemia Brother   . Hyperlipidemia Brother   . Hyperlipidemia Brother   . Colon cancer Neg Hx   . Colon polyps Neg Hx     Social History   Tobacco Use  . Smoking status: Former Smoker    Packs/day: 0.10    Years: 0.25    Pack years: 0.02    Start date: 1976    Quit date: 1976    Years since quitting: 46.3  . Smokeless tobacco: Never Used  . Tobacco comment: a couple months when 19 then stopped  Vaping Use  . Vaping Use: Never used  Substance Use Topics  . Alcohol use: No  . Drug use: No    Home Medications Prior to Admission  medications   Medication Sig Start Date End Date Taking? Authorizing Provider  aspirin 81 MG chewable tablet Chew 81 mg by mouth at bedtime.    Yes [provider]  Cholecalciferol (VITAMIN D3) 25 MCG (1000 UT) CAPS Take 2,000 Units by mouth 2 (two) times daily.   Yes [provider]  diltiazem (CARDIZEM LA) 180 MG 24 hr tablet Take 180 mg by mouth at bedtime. 10/22/20  Yes [provider]  furosemide (LASIX) 40 MG tablet Take 40 mg by mouth 2 (two) times daily. 04/15/18  Yes [provider]  hydrochlorothiazide (HYDRODIURIL) 50 MG tablet Take 50 mg by mouth daily. 11/11/20  Yes [provider]  labetalol (NORMODYNE) 100 MG tablet Take 100 mg by mouth 2 (two) times daily. 10/22/20  Yes [provider]  labetalol (NORMODYNE) 300 MG tablet Take 300 mg by  mouth 2 (two) times daily. 11/08/20  Yes [provider]  meloxicam (MOBIC) 15 MG tablet Take 1 tablet by mouth once daily 08/28/20  Yes [name], MD  pantoprazole (PROTONIX) 40 MG tablet Take 40 mg by mouth daily.   Yes [provider]  potassium chloride (KLOR-CON) 10 MEQ tablet Take 1 tablet (10 mEq total) by mouth daily. 06/07/19  Yes Fay Records, MD  SUPER B COMPLEX/C PO Take 1 tablet by mouth daily.   Yes [provider]  vitamin C (ASCORBIC ACID) 500 MG tablet Take 500 mg by mouth daily.   Yes [provider]  Zinc 15 MG CAPS Take 15 mg by mouth daily.   Yes [provider]  cetirizine (ZYRTEC) 10 MG tablet Take 1 tablet (10 mg total) by mouth daily. Patient not taking: Reported on 11/19/2020 06/16/18   Valentina Shaggy, MD  dexamethasone (DECADRON) 0.5 MG tablet Take 1 tablet (0.5 mg total) by mouth 4 (four) times daily. Patient not taking: No sig reported 10/15/20   Renato Shin, MD    Allergies    Atorvastatin, Doxycycline, and Neurontin [gabapentin]  Review of Systems   Review of Systems  Constitutional: Negative for chills and fever.  HENT: Negative for rhinorrhea and sore throat.   Eyes: Negative for visual disturbance.  Respiratory: Positive for shortness of breath. Negative for cough.   Cardiovascular: Negative for chest pain and leg swelling.  Gastrointestinal: Negative for abdominal pain, diarrhea, nausea and vomiting.  Genitourinary: Negative for dysuria.  Musculoskeletal: Negative for back pain and neck pain.  Skin: Negative for rash.  Neurological: Positive for light-headedness. Negative for dizziness and headaches.  Hematological: Does not bruise/bleed easily.  Psychiatric/Behavioral: Negative for confusion.    Physical Exam Updated Vital Signs BP (!) 132/56 (BP Location: Right Arm)   Pulse (!) 51   Temp 98 F (36.7 C) (Oral)   Resp 19   Ht 1.727 m (5\' 8" )   Wt (!) 136.9 kg   SpO2 100%   BMI 45.89  kg/m   Physical Exam Vitals and nursing note reviewed.  Constitutional:      General: She is not in acute distress.    Appearance: Normal appearance. She is well-developed. She is not ill-appearing.  HENT:     Head: Normocephalic and atraumatic.  Eyes:     Extraocular Movements: Extraocular movements intact.     Conjunctiva/sclera: Conjunctivae normal.     Pupils: Pupils are equal, round, and reactive to light.  Cardiovascular:     Rate and Rhythm: Normal rate and regular rhythm.     Heart sounds: No murmur  heard.   Pulmonary:     Effort: Pulmonary effort is normal. No respiratory distress.     Breath sounds: Normal breath sounds.  Chest:     Chest wall: No tenderness.  Abdominal:     Palpations: Abdomen is soft.     Tenderness: There is no abdominal tenderness.  Musculoskeletal:     Cervical back: Neck supple. No rigidity.     Left lower leg: Edema present.  Skin:    General: Skin is warm and dry.     Capillary Refill: Capillary refill takes less than 2 seconds.  Neurological:     General: No focal deficit present.     Mental Status: She is alert and oriented to person, place, and time.     Cranial Nerves: No cranial nerve deficit.     Sensory: No sensory deficit.     Motor: No weakness.     ED Results / Procedures / Treatments   Labs (all labs ordered are listed, but only abnormal results are displayed) Labs Reviewed  CBC - Abnormal; Notable for the following components:      Result Value   RDW 15.9 (*)    All other components within normal limits  COMPREHENSIVE METABOLIC PANEL - Abnormal; Notable for the following components:   Potassium 2.8 (*)    Chloride 95 (*)    Glucose, Bld 156 (*)    BUN 32 (*)    Creatinine, Ser 1.03 (*)    All other components within normal limits  RESP PANEL BY RT-PCR (FLU A&B, COVID) ARPGX2  BRAIN NATRIURETIC PEPTIDE  PROTIME-INR  MAGNESIUM  TSH  HEMOGLOBIN A1C  VITAMIN D 25 HYDROXY (VIT D DEFICIENCY, FRACTURES)  HIV  ANTIBODY (ROUTINE TESTING W REFLEX)  TROPONIN I (HIGH SENSITIVITY)  TROPONIN I (HIGH SENSITIVITY)    EKG EKG Interpretation  Date/Time:  Monday Nov 19 2020 07:11:40 EDT Ventricular Rate:  48 PR Interval:  198 QRS Duration: 103 QT Interval:  492 QTC Calculation: 440 R Axis:   -7 Text Interpretation: Sinus bradycardia Low voltage, precordial leads Borderline repol abnrm, inferolateral leads Minimal ST elevation, inferior leads Confirmed by Fredia Sorrow [phone]) on 11/19/2020 7:17:38 AM   Radiology CT Angio Chest PE W/Cm &/Or Wo Cm  Result Date: 11/19/2020 CLINICAL DATA:  Shortness of breath EXAM: CT ANGIOGRAPHY CHEST WITH CONTRAST TECHNIQUE: Multidetector CT imaging of the chest was performed using the standard protocol during bolus administration of intravenous contrast. Multiplanar CT image reconstructions and MIPs were obtained to evaluate the vascular anatomy. CONTRAST:  116mL OMNIPAQUE IOHEXOL 350 MG/ML SOLN COMPARISON:  03/25/2019 FINDINGS: Cardiovascular: Satisfactory opacification of the pulmonary arteries to the segmental level. No evidence of pulmonary embolism. New mild cardiomegaly. No pericardial effusion. Mediastinum/Nodes: No enlarged lymph nodes. Thyroid and esophagus are unremarkable. Very small hiatal hernia. Lungs/Pleura: No pleural effusion or pneumothorax. There is patchy bilateral ground-glass density. Upper Abdomen: No acute abnormality. Musculoskeletal: No acute osseous abnormality. Review of the MIP images confirms the above findings. IMPRESSION: No evidence acute pulmonary embolism. Patchy bilateral ground-glass density. May reflect edema given new mild cardiomegaly. Electronically Signed   By: Macy Mis M.D.   On: 11/19/2020 12:17   US Venous Img Lower  Left (DVT Study)  Result Date: 11/19/2020 CLINICAL DATA:  Calf pain and edema. EXAM: LEFT LOWER EXTREMITY VENOUS DUPLEX ULTRASOUND TECHNIQUE: Gray-scale sonography with graded compression, as well as color Doppler  and duplex ultrasound were performed to evaluate the left lower extremity deep venous system from the level of  the common femoral vein and including the common femoral, femoral, profunda femoral, popliteal and calf veins including the posterior tibial, peroneal and gastrocnemius veins when visible. The superficial great saphenous vein was also interrogated. Spectral Doppler was utilized to evaluate flow at rest and with distal augmentation maneuvers in the common femoral, femoral and popliteal veins. COMPARISON:  Bilateral lower extremity venous duplex ultrasound November 01, 2020 FINDINGS: Contralateral Common Femoral Vein: Respiratory phasicity is normal and symmetric with the symptomatic side. No evidence of thrombus. Normal compressibility. Common Femoral Vein: No evidence of thrombus. Normal compressibility, respiratory phasicity and response to augmentation. Saphenofemoral Junction: No evidence of thrombus. Normal compressibility and flow on color Doppler imaging. Profunda Femoral Vein: No evidence of thrombus. Normal compressibility and flow on color Doppler imaging. Femoral Vein: No evidence of thrombus. Normal compressibility, respiratory phasicity and response to augmentation. Popliteal Vein: No evidence of thrombus. Normal compressibility, respiratory phasicity and response to augmentation. Calf Veins: No evidence of thrombus. Normal compressibility and flow on color Doppler imaging. Superficial Great Saphenous Vein: No evidence of thrombus. Normal compressibility. Venous Reflux:  None. Other Findings: No lesion seen in the lateral left calf region at site of apparent palpable fullness. IMPRESSION: No evidence of deep venous thrombosis in the left lower extremity. Right common femoral vein patent. No mass or fluid evident in the lateral left calf region by ultrasound. Electronically Signed   By: Lowella Grip III M.D.   On: 11/19/2020 10:53   DG Chest Port 1 View  Result Date: 11/19/2020 CLINICAL  DATA:  65 year old female with history of shortness of breath worsening today. EXAM: PORTABLE CHEST 1 VIEW COMPARISON:  Chest x-ray 08/23/2019. FINDINGS: Lung volumes are low. No consolidative airspace disease. No pleural effusions. No pneumothorax. No pulmonary nodule or mass noted. Pulmonary vasculature and the cardiomediastinal silhouette are within normal limits. IMPRESSION: 1. Low lung volumes without radiographic evidence of acute cardiopulmonary disease. Electronically Signed   By: Vinnie Langton M.D.   On: 11/19/2020 07:55   ECHOCARDIOGRAM COMPLETE  Result Date: 11/19/2020    ECHOCARDIOGRAM REPORT   Patient Name:   Meagan Reyes Date of Exam: 11/19/2020 Medical Rec #:  [id number]       Height:       68.0 in Accession #:    [id number]      Weight:       301.8 lb Date of Birth:  1955-08-05       BSA:          2.434 m Patient Age:    44 years        BP:           132/56 mmHg Patient Gender: F               HR:           56 bpm. Exam Location:  [facility] Procedure: 2D Echo, Cardiac Doppler and Color Doppler Indications:    I51.7 Cardiomegaly  History:        Patient has prior history of Echocardiogram examinations, most                 recent 05/12/2019. CHF; Risk Factors:Hypertension, Dyslipidemia                 and GERD.  Sonographer:    Jonelle Sidle Dance Referring Phys: [place]  1. Left ventricular ejection fraction, by estimation, is 65 to 70%. The left ventricle has normal function. The left ventricle has no  regional wall motion abnormalities. Left ventricular diastolic parameters are indeterminate.  2. Right ventricular systolic function is normal. The right ventricular size is normal.  3. The mitral valve is normal in structure. No evidence of mitral valve regurgitation. No evidence of mitral stenosis.  4. The aortic valve is tricuspid. Aortic valve regurgitation is not visualized. No aortic stenosis is present.  5. Aortic dilatation noted. There is mild dilatation of the  ascending aorta, measuring 40 mm. FINDINGS  Left Ventricle: Left ventricular ejection fraction, by estimation, is 65 to 70%. The left ventricle has normal function. The left ventricle has no regional wall motion abnormalities. The left ventricular internal cavity size was normal in size. There is  no left ventricular hypertrophy. Left ventricular diastolic parameters are indeterminate. Right Ventricle: The right ventricular size is normal. No increase in right ventricular wall thickness. Right ventricular systolic function is normal. Left Atrium: Left atrial size was normal in size. Right Atrium: Right atrial size was normal in size. Pericardium: There is no evidence of pericardial effusion. Mitral Valve: The mitral valve is normal in structure. No evidence of mitral valve regurgitation. No evidence of mitral valve stenosis. Tricuspid Valve: The tricuspid valve is normal in structure. Tricuspid valve regurgitation is not demonstrated. No evidence of tricuspid stenosis. Aortic Valve: The aortic valve is tricuspid. Aortic valve regurgitation is not visualized. No aortic stenosis is present. Aortic valve mean gradient measures 4.7 mmHg. Aortic valve peak gradient measures 9.9 mmHg. Aortic valve area, by VTI measures 2.44 cm. Pulmonic Valve: The pulmonic valve was not well visualized. Pulmonic valve regurgitation is not visualized. No evidence of pulmonic stenosis. Aorta: The aortic root is normal in size and structure and aortic dilatation noted. There is mild dilatation of the ascending aorta, measuring 40 mm. Pulmonary Artery: Indeterminant PASP, inadequate TR jet. IAS/Shunts: No atrial level shunt detected by color flow Doppler.  LEFT VENTRICLE PLAX 2D LVIDd:         4.98 cm  Diastology LVIDs:         2.57 cm  LV e' medial:    7.45 cm/s LV PW:         1.07 cm  LV E/e' medial:  14.5 LV IVS:        0.89 cm  LV e' lateral:   9.29 cm/s LVOT diam:     2.00 cm  LV E/e' lateral: 11.6 LV SV:         84 LV SV Index:   35  LVOT Area:     3.14 cm  RIGHT VENTRICLE RV Basal diam:  2.83 cm RV S prime:     16.10 cm/s TAPSE (M-mode): 2.4 cm LEFT ATRIUM             Index       RIGHT ATRIUM           Index LA diam:        3.90 cm 1.60 cm/m  RA Area:     12.20 cm LA Vol (A2C):   48.5 ml 19.92 ml/m RA Volume:   24.70 ml  10.15 ml/m LA Vol (A4C):   65.3 ml 26.82 ml/m LA Biplane Vol: 58.2 ml 23.91 ml/m  AORTIC VALVE AV Area (Vmax):    2.31 cm AV Area (Vmean):   2.27 cm AV Area (VTI):     2.44 cm AV Vmax:           157.46 cm/s AV Vmean:          101.257  cm/s AV VTI:            0.346 m AV Peak Grad:      9.9 mmHg AV Mean Grad:      4.7 mmHg LVOT Vmax:         115.66 cm/s LVOT Vmean:        73.147 cm/s LVOT VTI:          0.269 m LVOT/AV VTI ratio: 0.78  AORTA Ao Root diam: 3.00 cm Ao Asc diam:  4.00 cm MITRAL VALVE MV Area (PHT): 2.78 cm     SHUNTS MV Decel Time: 273 msec     Systemic VTI:  0.27 m MV E velocity: 108.00 cm/s  Systemic Diam: 2.00 cm MV A velocity: 114.00 cm/s MV E/A ratio:  0.95 Carlyle Dolly MD Electronically signed by Carlyle Dolly MD Signature Date/Time: 11/19/2020/4:14:30 PM    Final     Procedures Procedures   Medications Ordered in ED Medications  aspirin chewable tablet 81 mg (has no administration in time range)  nortriptyline (PAMELOR) capsule 10 mg (has no administration in time range)  pantoprazole (PROTONIX) EC tablet 40 mg (40 mg Oral Given 11/19/20 1603)  cholecalciferol (VITAMIN D3) tablet 2,000 Units (has no administration in time range)  0.9 % NaCl with KCl 20 mEq/ L  infusion ( Intravenous New Bag/Given 11/19/20 1635)  acetaminophen (TYLENOL) tablet 650 mg (has no administration in time range)    Or  acetaminophen (TYLENOL) suppository 650 mg (has no administration in time range)  oxyCODONE (Oxy IR/ROXICODONE) immediate release tablet 5 mg (has no administration in time range)  zolpidem (AMBIEN) tablet 5 mg (has no administration in time range)  bisacodyl (DULCOLAX) EC tablet 5 mg (has no  administration in time range)  ondansetron (ZOFRAN) tablet 4 mg (has no administration in time range)    Or  ondansetron (ZOFRAN) injection 4 mg (has no administration in time range)  enoxaparin (LOVENOX) injection 70 mg (70 mg Subcutaneous Given 11/19/20 1604)  hydrALAZINE (APRESOLINE) injection 10 mg (has no administration in time range)  potassium chloride 10 mEq in 100 mL IVPB (0 mEq Intravenous Stopped 11/19/20 0938)  potassium chloride SA (KLOR-CON) CR tablet 40 mEq (40 mEq Oral Given 11/19/20 0836)  iohexol (OMNIPAQUE) 350 MG/ML injection 100 mL (100 mLs Intravenous Contrast Given 11/19/20 1141)    ED Course  I have reviewed the triage vital signs and the nursing notes.  Pertinent labs & imaging results that were available during my care of the patient were reviewed by me and considered in my medical decision making (see chart for details).    MDM Rules/Calculators/A&P                         Work-up geared for the shortness of breath.  Chest x-ray without any significant findings.  Proceeded with CT angio chest and Doppler studies of the left lower extremity.  Doppler studies were negative for DVT.  CT angio test negative for pulmonary embolus.  Did raise some concerns about ground glass appearance.  However BNP is normal.  Patient's heart rate on the monitor was a sinus bradycardia.  No arrhythmias.  Heart rate mostly in the 50s.  Patient is on diltiazem.  Patient took last dose of diltiazem at midnight.  So that is a 24-hour dose.  So will not be wearing off.  Patient may be having symptomatic bradycardia.  Would recommend holding diltiazem if the heart rate does not improve or  if the symptoms of shortness of breath and lightheadedness not improved patient may require consideration for pacemaker.  Discussed with the hospitalist who will see and admit.  Troponins without any significant change. Final Clinical Impression(s) / ED Diagnoses Final diagnoses:  Shortness of breath   Hypokalemia  Symptomatic bradycardia    Rx / DC Orders ED Discharge Orders    None       Fredia Sorrow, MD 11/19/20 [phone]

## 2020-11-19 NOTE — ED Triage Notes (Signed)
rcems from home with cc of sob that has been going on for over a year but worse today. Sob with ambulation   Takes HCTZ, Lasix, Diltiazem. Per EMS pt HR was in the 40's. Pt was 98% on 4l,   18g left ac- 149ml.

## 2020-11-19 NOTE — ED Provider Notes (Signed)
MSE was initiated and I personally evaluated the patient and placed orders (if any) at  7:01 AM on Nov 19, 2020.  The patient appears stable so that the remainder of the MSE may be completed by another provider.   Maudie Flakes, MD 11/19/20 [phone]

## 2020-11-19 NOTE — ED Notes (Signed)
Patient ambulated with no difficulty. O2 levels remained above 90% pulse was between 67-70bpm

## 2020-11-20 DIAGNOSIS — E876 Hypokalemia: Secondary | ICD-10-CM | POA: Diagnosis not present

## 2020-11-20 DIAGNOSIS — I5032 Chronic diastolic (congestive) heart failure: Secondary | ICD-10-CM | POA: Diagnosis not present

## 2020-11-20 DIAGNOSIS — R0600 Dyspnea, unspecified: Secondary | ICD-10-CM | POA: Diagnosis not present

## 2020-11-20 DIAGNOSIS — N179 Acute kidney failure, unspecified: Secondary | ICD-10-CM | POA: Diagnosis not present

## 2020-11-20 DIAGNOSIS — I10 Essential (primary) hypertension: Secondary | ICD-10-CM

## 2020-11-20 LAB — BASIC METABOLIC PANEL
Anion gap: 9 (ref 5–15)
BUN: 24 mg/dL — ABNORMAL HIGH (ref 8–23)
CO2: 29 mmol/L (ref 22–32)
Calcium: 8.6 mg/dL — ABNORMAL LOW (ref 8.9–10.3)
Chloride: 100 mmol/L (ref 98–111)
Creatinine, Ser: 0.81 mg/dL (ref 0.44–1.00)
GFR, Estimated: >60 mL/min (ref >60)
Glucose, Bld: 152 mg/dL — ABNORMAL HIGH (ref 70–99)
Potassium: 2.9 mmol/L — ABNORMAL LOW (ref 3.5–5.1)
Sodium: 138 mmol/L (ref 135–145)

## 2020-11-20 LAB — LIPID PANEL
Cholesterol: 227 mg/dL — ABNORMAL HIGH (ref 0–200)
HDL: 37 mg/dL — ABNORMAL LOW (ref >40)
LDL Cholesterol: UNDETERMINED mg/dL (ref 0–99)
Total CHOL/HDL Ratio: 6.1 RATIO
Triglycerides: 413 mg/dL — ABNORMAL HIGH (ref <150)
VLDL: UNDETERMINED mg/dL (ref 0–40)

## 2020-11-20 LAB — MAGNESIUM: Magnesium: 2 mg/dL (ref 1.7–2.4)

## 2020-11-20 LAB — LDL CHOLESTEROL, DIRECT: Direct LDL: 120.2 mg/dL — ABNORMAL HIGH (ref 0–99)

## 2020-11-20 MED ORDER — BLOOD GLUCOSE METER KIT: PACK | 0 refills | Status: DC

## 2020-11-20 MED ORDER — POTASSIUM CHLORIDE ER 20 MEQ PO TBCR: 20.0000 meq | EXTENDED_RELEASE_TABLET | Freq: Two times a day (BID) | ORAL | 1 refills | Status: DC

## 2020-11-20 MED ORDER — POTASSIUM CHLORIDE CRYS ER 20 MEQ PO TBCR
60.0000 meq | EXTENDED_RELEASE_TABLET | Freq: Once | ORAL | Status: AC
  Administered 2020-11-20: 60 meq via ORAL
  Filled 2020-11-20: qty 3

## 2020-11-20 MED ORDER — METFORMIN HCL ER 500 MG PO TB24: ORAL_TABLET | ORAL | 1 refills | Status: DC

## 2020-11-20 NOTE — Discharge Summary (Signed)
Physician Discharge Summary  Meagan Reyes NWG:[id number] DOB: May 13, 1956 DOA: 11/19/2020  PCP: Redmond School, MD  Admit date: 11/19/2020 Discharge date: 11/20/2020  Admitted From:  Home  Disposition:  Home   Recommendations for Outpatient Follow-up:  1. Follow up with PCP in 5-7 days 2. amb referral for sleep study made 3. amb referral to diabetes and nutrition center 4. amb referral to lipid disorders clinic 5. Please titrate and adjust medication for diabetes  6. Please monitor blood pressure and treat as needed 7. Please avoid heart rate lowering medication as she had severe bradycardia on cardizem and labetalol 8. Ongoing outpatient surveillance of mild aortic dilatation  Discharge Condition: STABLE   CODE STATUS: FULL DIET: heart healthy carb modified    Brief Hospitalization Summary: Please see all hospital notes, images, labs for full details of the hospitalization. ADMISSION HPI: Meagan Reyes is a 65 y.o. female with medical history significant for hypertension, morbid obesity, GERD, diastolic heart failure, hyperlipidemia who reportedly had been taken off lisinopril several months ago for symptoms of dyspnea and had been placed on labetalol and diltiazem, lasix and HCTZ for reported difficult to control blood pressure. She is actually taking 400 mg labetalol twice daily, 180 mg cardizem CD.  She reports that for the past year she has been having symptoms of shortness of breath. She has been having outpatient work up for this but has been unrevealing.  She reports that her SOB became worse today.  She has SOB with ambulation.  She denies chest pain.  She reports that ambulation does make SOB worse.  She called EMS and they found that her HR was in the 40s.  She was 98% on 4L [place].  She also reports that she has been dealing with leg swelling.  She had a recent venous ultrasound of the left leg that was negative for DVT.   ED Course: She had a pretty thorough work-up in the  emergency department for dyspnea.  She never desaturated.  She is 98% on room air.  Her heart rate was low at 54.  Blood pressures 132/56.  She was afebrile.  Sodium 139, potassium 2.8, glucose 156, creatinine 1.03, BUN 32.  Cardiac BNP 31.0, high-sensitivity troponin 8.  WBC 10.1, hemoglobin 12.7, platelets 237.  Portable chest x-ray no acute findings.  CTA chest with no findings of pulmonary embolism cardiomegaly and groundglass appearance.  Venous ultrasound negative for DVT.  Respiratory panel still pending.  Admission was requested for further evaluation symptomatic bradycardia and dyspnea on exertion.  Hospital Course  Pt had been in process of receiving a thorough outpatient work up for her dyspnea when she presented to hospital.  She was noted to have severe sinus bradycardia with HR in the 40-50 range.  She was taken off diltiazem and labetalol.  Her HR have improved on telemetry monitoring to the 60-75 range and her blood pressures have been well controlled most recently 124/69.  She had a 2D echocardiogram done with findings noted below,  Normal LVEF, indeterminate DD, mild aortic dilatation was noted.  She was given gentle IV fluid hydration that did correct the AKI and that has resolved.  She is total body depleted of potassium being on lasix and high dose HCTZ.  She was given oral and IV potassium and her home potassium was increased to 20 meQ twice daily.  Pt reports that she has OSA and is not using CPAP.  She uses her husband's CPAP but it is broken now.  I tried to explain to her that untreated OSA can contribute to her symptoms.  She was referred for a new sleep study.    Her A1c was 6.6%.  She will be referred to outpatient diabetes education.  She was given RX to start Glucophage XR with titration instruction.  Blood glucose monitor and meter with supplies was ordered.  Nutrition education ordered.    PT had no further recommendations for patient.    Lipids came back very abnormal.  Pt reports family history of I have referred her to the advanced lipid management clinic for assistance.  Also treating the diabetes mellitus will also help with the lipids.    Pt is stable to discharge home with outpatient follow up.   Discharge Diagnoses:  Principal Problem:   Hypokalemia Active Problems:   DOE (dyspnea on exertion)   Morbid obesity due to excess calories (HCC)   Chronic diastolic heart failure (HCC)   Essential hypertension   GERD (gastroesophageal reflux disease)   Sinus bradycardia   AKI (acute kidney injury) ([place])   Hyperglycemia   Discharge Instructions: Discharge Instructions    AMB Referral to Advanced Lipid Disorders Clinic   Complete by: As directed    Internal Lipid Clinic Referral Scheduling  Internal lipid clinic referrals are providers within [facility], who wish to refer established patients for routine management (help in starting PCSK9 inhibitor therapy) or advanced therapies.  Internal MD referral criteria:              1. All patients with LDL>190 mg/dL  2. All patients with Triglycerides >500 mg/dL  3. Patients with suspected or confirmed heterozygous familial hyperlipidemia (HeFH) or homozygous familial hyperlipidemia (HoFH)  4. Patients with family history of suspicious for genetic dyslipidemia desiring genetic testing  5. Patients refractory to standard guideline based therapy  6. Patients with statin intolerance (failed 2 statins, one of which must be a high potency statin)  7. Patients who the provider desires to be seen by MD   Internal PharmD referral criteria:   1. Follow-up patients for medication management  2. Follow-up for compliance monitoring  3. Patients for drug education  4. Patients with statin intolerance  5. PCSK9 inhibitor education and prior authorization approvals  6. Patients with triglycerides <500 mg/dL  External Lipid Clinic Referral  External lipid clinic referrals are for providers outside of [facility],  considered new clinic patients - automatically routed to MD schedule   Ambulatory referral to Sleep Studies   Complete by: As directed    Referral to Nutrition and Diabetes Services   Complete by: As directed    Choose type of Diabetes Self-Management Training (DSMT) training services and number of hours requested: Initial DSMT: 10 hours   Check all special needs that apply to patient requiring 1 on 1 DSMT: Eating disorder   DSMT Content: Comprehensive self-management skills- All of the content areas   Choose the type of Medical Nutrition Therapy (MNT) and number of hours: Initial MNT: 3 hours   FOR MEDICARE PATIENTS: I hereby certify that I am managing this beneficiary's diabetes condition and that the above prescribed training is a necessary part of management.: Does not apply     Allergies as of 11/20/2020      Reactions   Atorvastatin Other (See Comments)   Myalgia; pt reports this occurred in past, was prescribed by different doctor.   Doxycycline    Shortness or breath one time another time had symptoms of heart attack   Neurontin [  gabapentin] Rash      Medication List    STOP taking these medications   cetirizine 10 MG tablet Commonly known as: ZYRTEC   dexamethasone 0.5 MG tablet Commonly known as: DECADRON   diltiazem 180 MG 24 hr tablet Commonly known as: CARDIZEM LA   hydrochlorothiazide 50 MG tablet Commonly known as: HYDRODIURIL   labetalol 100 MG tablet Commonly known as: NORMODYNE   labetalol 300 MG tablet Commonly known as: NORMODYNE     TAKE these medications   aspirin 81 MG chewable tablet Chew 81 mg by mouth at bedtime.   blood glucose meter kit and supplies Dispense based on patient and insurance preference. Use up to four times daily as directed. (FOR ICD-10 E10.9, E11.9).   furosemide 40 MG tablet Commonly known as: LASIX Take 40 mg by mouth 2 (two) times daily.   meloxicam 15 MG tablet Commonly known as: MOBIC Take 1 tablet by mouth once  daily   metFORMIN 500 MG 24 hr tablet Commonly known as: Glucophage XR Take 1 tab daily with supper x 7 days, then 1 po BID with meals x 7 days, then 2 po BID   pantoprazole 40 MG tablet Commonly known as: PROTONIX Take 40 mg by mouth daily.   Potassium Chloride ER 20 MEQ Tbcr Take 20 mEq by mouth 2 (two) times daily. What changed:   medication strength  how much to take  when to take this   SUPER B COMPLEX/C PO Take 1 tablet by mouth daily.   vitamin C 500 MG tablet Commonly known as: ASCORBIC ACID Take 500 mg by mouth daily.   Vitamin D3 25 MCG (1000 UT) Caps Take 2,000 Units by mouth 2 (two) times daily.   Zinc 15 MG Caps Take 15 mg by mouth daily.       Follow-up Information    Redmond School, MD. Schedule an appointment as soon as possible for a visit in 1 week(s).   Specialty: Internal Medicine Why: Hospital Follow Up  Contact information: [address] [place] [address] [phone]              Allergies  Allergen Reactions  . Atorvastatin Other (See Comments)    Myalgia; pt reports this occurred in past, was prescribed by different doctor.  . Doxycycline     Shortness or breath one time another time had symptoms of heart attack  . Neurontin [Gabapentin] Rash   Allergies as of 11/20/2020      Reactions   Atorvastatin Other (See Comments)   Myalgia; pt reports this occurred in past, was prescribed by different doctor.   Doxycycline    Shortness or breath one time another time had symptoms of heart attack   Neurontin [gabapentin] Rash      Medication List    STOP taking these medications   cetirizine 10 MG tablet Commonly known as: ZYRTEC   dexamethasone 0.5 MG tablet Commonly known as: DECADRON   diltiazem 180 MG 24 hr tablet Commonly known as: CARDIZEM LA   hydrochlorothiazide 50 MG tablet Commonly known as: HYDRODIURIL   labetalol 100 MG tablet Commonly known as: NORMODYNE   labetalol 300 MG tablet Commonly known  as: NORMODYNE     TAKE these medications   aspirin 81 MG chewable tablet Chew 81 mg by mouth at bedtime.   blood glucose meter kit and supplies Dispense based on patient and insurance preference. Use up to four times daily as directed. (FOR ICD-10 E10.9, E11.9).   furosemide  40 MG tablet Commonly known as: LASIX Take 40 mg by mouth 2 (two) times daily.   meloxicam 15 MG tablet Commonly known as: MOBIC Take 1 tablet by mouth once daily   metFORMIN 500 MG 24 hr tablet Commonly known as: Glucophage XR Take 1 tab daily with supper x 7 days, then 1 po BID with meals x 7 days, then 2 po BID   pantoprazole 40 MG tablet Commonly known as: PROTONIX Take 40 mg by mouth daily.   Potassium Chloride ER 20 MEQ Tbcr Take 20 mEq by mouth 2 (two) times daily. What changed:   medication strength  how much to take  when to take this   SUPER B COMPLEX/C PO Take 1 tablet by mouth daily.   vitamin C 500 MG tablet Commonly known as: ASCORBIC ACID Take 500 mg by mouth daily.   Vitamin D3 25 MCG (1000 UT) Caps Take 2,000 Units by mouth 2 (two) times daily.   Zinc 15 MG Caps Take 15 mg by mouth daily.       Procedures/Studies: CT Angio Chest PE W/Cm &/Or Wo Cm  Result Date: 11/19/2020 CLINICAL DATA:  Shortness of breath EXAM: CT ANGIOGRAPHY CHEST WITH CONTRAST TECHNIQUE: Multidetector CT imaging of the chest was performed using the standard protocol during bolus administration of intravenous contrast. Multiplanar CT image reconstructions and MIPs were obtained to evaluate the vascular anatomy. CONTRAST:  19mL OMNIPAQUE IOHEXOL 350 MG/ML SOLN COMPARISON:  03/25/2019 FINDINGS: Cardiovascular: Satisfactory opacification of the pulmonary arteries to the segmental level. No evidence of pulmonary embolism. New mild cardiomegaly. No pericardial effusion. Mediastinum/Nodes: No enlarged lymph nodes. Thyroid and esophagus are unremarkable. Very small hiatal hernia. Lungs/Pleura: No pleural  effusion or pneumothorax. There is patchy bilateral ground-glass density. Upper Abdomen: No acute abnormality. Musculoskeletal: No acute osseous abnormality. Review of the MIP images confirms the above findings. IMPRESSION: No evidence acute pulmonary embolism. Patchy bilateral ground-glass density. May reflect edema given new mild cardiomegaly. Electronically Signed   By: Macy Mis M.D.   On: 11/19/2020 12:17   US Venous Img Lower Bilateral (DVT)  Result Date: 11/01/2020 CLINICAL DATA:  BILATERAL leg pain and edema EXAM: BILATERAL LOWER EXTREMITY VENOUS DOPPLER ULTRASOUND TECHNIQUE: Gray-scale sonography with compression, as well as color and duplex ultrasound, were performed to evaluate the deep venous system(s) from the level of the common femoral vein through the popliteal and proximal calf veins. COMPARISON:  RIGHT lower extremity venous ultrasound 10/22/2015 FINDINGS: VENOUS Normal compressibility of BILATERAL common femoral, superficial femoral, and popliteal veins, as well as the visualized calf veins. Visualized portions of BILATERAL profunda femoral vein and great saphenous vein unremarkable. No filling defects to suggest DVT on grayscale or color Doppler imaging. Doppler waveforms show normal direction of venous flow, normal respiratory plasticity and response to augmentation. OTHER Small complex fluid collection identified at the anterolateral RIGHT lower leg proximally, 1.2 x 2.8 x 4.5 cm length, without surrounding hypervascularity on color Doppler imaging, question small hematoma. Limitations: None IMPRESSION: No evidence of deep venous thrombosis in either lower extremity. Small complex fluid collection at the proximal lower RIGHT leg anterolaterally 1.2 x 2.8 x 4.5 cm question small hematoma. Electronically Signed   By: Lavonia Dana M.D.   On: 11/01/2020 10:45   US Venous Img Lower  Left (DVT Study)  Result Date: 11/19/2020 CLINICAL DATA:  Calf pain and edema. EXAM: LEFT LOWER EXTREMITY  VENOUS DUPLEX ULTRASOUND TECHNIQUE: Gray-scale sonography with graded compression, as well as color Doppler and duplex ultrasound  were performed to evaluate the left lower extremity deep venous system from the level of the common femoral vein and including the common femoral, femoral, profunda femoral, popliteal and calf veins including the posterior tibial, peroneal and gastrocnemius veins when visible. The superficial great saphenous vein was also interrogated. Spectral Doppler was utilized to evaluate flow at rest and with distal augmentation maneuvers in the common femoral, femoral and popliteal veins. COMPARISON:  Bilateral lower extremity venous duplex ultrasound November 01, 2020 FINDINGS: Contralateral Common Femoral Vein: Respiratory phasicity is normal and symmetric with the symptomatic side. No evidence of thrombus. Normal compressibility. Common Femoral Vein: No evidence of thrombus. Normal compressibility, respiratory phasicity and response to augmentation. Saphenofemoral Junction: No evidence of thrombus. Normal compressibility and flow on color Doppler imaging. Profunda Femoral Vein: No evidence of thrombus. Normal compressibility and flow on color Doppler imaging. Femoral Vein: No evidence of thrombus. Normal compressibility, respiratory phasicity and response to augmentation. Popliteal Vein: No evidence of thrombus. Normal compressibility, respiratory phasicity and response to augmentation. Calf Veins: No evidence of thrombus. Normal compressibility and flow on color Doppler imaging. Superficial Great Saphenous Vein: No evidence of thrombus. Normal compressibility. Venous Reflux:  None. Other Findings: No lesion seen in the lateral left calf region at site of apparent palpable fullness. IMPRESSION: No evidence of deep venous thrombosis in the left lower extremity. Right common femoral vein patent. No mass or fluid evident in the lateral left calf region by ultrasound. Electronically Signed   By:  Lowella Grip III M.D.   On: 11/19/2020 10:53   DG Chest Port 1 View  Result Date: 11/19/2020 CLINICAL DATA:  65 year old female with history of shortness of breath worsening today. EXAM: PORTABLE CHEST 1 VIEW COMPARISON:  Chest x-ray 08/23/2019. FINDINGS: Lung volumes are low. No consolidative airspace disease. No pleural effusions. No pneumothorax. No pulmonary nodule or mass noted. Pulmonary vasculature and the cardiomediastinal silhouette are within normal limits. IMPRESSION: 1. Low lung volumes without radiographic evidence of acute cardiopulmonary disease. Electronically Signed   By: Vinnie Langton M.D.   On: 11/19/2020 07:55   ECHOCARDIOGRAM COMPLETE  Result Date: 11/19/2020    ECHOCARDIOGRAM REPORT   Patient Name:   Meagan Reyes Date of Exam: 11/19/2020 Medical Rec #:  [id number]       Height:       68.0 in Accession #:    [id number]      Weight:       301.8 lb Date of Birth:  [birth year]       BSA:          2.434 m Patient Age:    65 years        BP:           132/56 mmHg Patient Gender: F               HR:           56 bpm. Exam Location:  [facility] Procedure: 2D Echo, Cardiac Doppler and Color Doppler Indications:    I51.7 Cardiomegaly  History:        Patient has prior history of Echocardiogram examinations, most                 recent 05/12/2019. CHF; Risk Factors:Hypertension, Dyslipidemia                 and GERD.  Sonographer:    Jonelle Sidle Dance Referring Phys: [place]  1. Left ventricular ejection fraction, by estimation,  is 65 to 70%. The left ventricle has normal function. The left ventricle has no regional wall motion abnormalities. Left ventricular diastolic parameters are indeterminate.  2. Right ventricular systolic function is normal. The right ventricular size is normal.  3. The mitral valve is normal in structure. No evidence of mitral valve regurgitation. No evidence of mitral stenosis.  4. The aortic valve is tricuspid. Aortic valve regurgitation is  not visualized. No aortic stenosis is present.  5. Aortic dilatation noted. There is mild dilatation of the ascending aorta, measuring 40 mm. FINDINGS  Left Ventricle: Left ventricular ejection fraction, by estimation, is 65 to 70%. The left ventricle has normal function. The left ventricle has no regional wall motion abnormalities. The left ventricular internal cavity size was normal in size. There is  no left ventricular hypertrophy. Left ventricular diastolic parameters are indeterminate. Right Ventricle: The right ventricular size is normal. No increase in right ventricular wall thickness. Right ventricular systolic function is normal. Left Atrium: Left atrial size was normal in size. Right Atrium: Right atrial size was normal in size. Pericardium: There is no evidence of pericardial effusion. Mitral Valve: The mitral valve is normal in structure. No evidence of mitral valve regurgitation. No evidence of mitral valve stenosis. Tricuspid Valve: The tricuspid valve is normal in structure. Tricuspid valve regurgitation is not demonstrated. No evidence of tricuspid stenosis. Aortic Valve: The aortic valve is tricuspid. Aortic valve regurgitation is not visualized. No aortic stenosis is present. Aortic valve mean gradient measures 4.7 mmHg. Aortic valve peak gradient measures 9.9 mmHg. Aortic valve area, by VTI measures 2.44 cm. Pulmonic Valve: The pulmonic valve was not well visualized. Pulmonic valve regurgitation is not visualized. No evidence of pulmonic stenosis. Aorta: The aortic root is normal in size and structure and aortic dilatation noted. There is mild dilatation of the ascending aorta, measuring 40 mm. Pulmonary Artery: Indeterminant PASP, inadequate TR jet. IAS/Shunts: No atrial level shunt detected by color flow Doppler.  LEFT VENTRICLE PLAX 2D LVIDd:         4.98 cm  Diastology LVIDs:         2.57 cm  LV e' medial:    7.45 cm/s LV PW:         1.07 cm  LV E/e' medial:  14.5 LV IVS:        0.89 cm  LV  e' lateral:   9.29 cm/s LVOT diam:     2.00 cm  LV E/e' lateral: 11.6 LV SV:         84 LV SV Index:   35 LVOT Area:     3.14 cm  RIGHT VENTRICLE RV Basal diam:  2.83 cm RV S prime:     16.10 cm/s TAPSE (M-mode): 2.4 cm LEFT ATRIUM             Index       RIGHT ATRIUM           Index LA diam:        3.90 cm 1.60 cm/m  RA Area:     12.20 cm LA Vol (A2C):   48.5 ml 19.92 ml/m RA Volume:   24.70 ml  10.15 ml/m LA Vol (A4C):   65.3 ml 26.82 ml/m LA Biplane Vol: 58.2 ml 23.91 ml/m  AORTIC VALVE AV Area (Vmax):    2.31 cm AV Area (Vmean):   2.27 cm AV Area (VTI):     2.44 cm AV Vmax:  157.46 cm/s AV Vmean:          101.257 cm/s AV VTI:            0.346 m AV Peak Grad:      9.9 mmHg AV Mean Grad:      4.7 mmHg LVOT Vmax:         115.66 cm/s LVOT Vmean:        73.147 cm/s LVOT VTI:          0.269 m LVOT/AV VTI ratio: 0.78  AORTA Ao Root diam: 3.00 cm Ao Asc diam:  4.00 cm MITRAL VALVE MV Area (PHT): 2.78 cm     SHUNTS MV Decel Time: 273 msec     Systemic VTI:  0.27 m MV E velocity: 108.00 cm/s  Systemic Diam: 2.00 cm MV A velocity: 114.00 cm/s MV E/A ratio:  0.95 Carlyle Dolly MD Electronically signed by Carlyle Dolly MD Signature Date/Time: 11/19/2020/4:14:30 PM    Final      Subjective: Pt says she still has SOB. She ambulated with PT.  She is not requiring oxygen.  She denies CP.  She says she had been using husband's CPAP but it is broken.  She has not had a sleep study of her own recently.  She reports that she has sleep apnea.    Discharge Exam: Vitals:   11/20/20 0206 11/20/20 0601  BP: 132/66 124/69  Pulse: 65 72  Resp: 18 18  Temp: 98.7 F (37.1 C) 98.7 F (37.1 C)  SpO2: 98% 98%   Vitals:   11/19/20 1830 11/19/20 2206 11/20/20 0206 11/20/20 0601  BP: 132/62 (!) 128/54 132/66 124/69  Pulse: (!) 53 60 65 72  Resp: _0 Temp: 98.7 F (37.1 C) 98.2 F (36.8 C) 98.7 F (37.1 C) 98.7 F (37.1 C)  TempSrc: Oral Oral Oral   SpO2: 100% 97% 98% 98%  Weight:       Height:       General: Pt is alert, awake, not in acute distress, no increased work of breathing.     The results of significant diagnostics from this hospitalization (including imaging, microbiology, ancillary and laboratory) are listed below for reference.     Microbiology: Recent Results (from the past 240 hour(s))  Resp Panel by RT-PCR (Flu A&B, Covid) Nasopharyngeal Swab     Status: None   Collection Time: 11/19/20  1:17 PM   Specimen: Nasopharyngeal Swab; Nasopharyngeal(NP) swabs in vial transport medium  Result Value Ref Range Status   SARS Coronavirus 2 by RT PCR NEGATIVE NEGATIVE Final    Comment: (NOTE) SARS-CoV-2 target nucleic acids are NOT DETECTED.  The SARS-CoV-2 RNA is generally detectable in upper respiratory specimens during the acute phase of infection. The lowest concentration of SARS-CoV-2 viral copies this assay can detect is 138 copies/mL. A negative result does not preclude SARS-Cov-2 infection and should not be used as the sole basis for treatment or other patient management decisions. A negative result may occur with  improper specimen collection/handling, submission of specimen other than nasopharyngeal swab, presence of viral mutation(s) within the areas targeted by this assay, and inadequate number of viral copies(<138 copies/mL). A negative result must be combined with clinical observations, patient history, and epidemiological information. The expected result is Negative.  Fact Sheet for Patients:  EntrepreneurPulse.com.au  Fact Sheet for Healthcare Providers:  IncredibleEmployment.be  This test is no t yet approved or cleared by the [place] FDA and  has been authorized for detection  and/or diagnosis of SARS-CoV-2 by FDA under an Emergency Use Authorization (EUA). This EUA will remain  in effect (meaning this test can be used) for the duration of the COVID-19 declaration under Section 564(b)(1) of the  Act, 21 U.S.C.section 360bbb-3(b)(1), unless the authorization is terminated  or revoked sooner.       Influenza A by PCR NEGATIVE NEGATIVE Final   Influenza B by PCR NEGATIVE NEGATIVE Final    Comment: (NOTE) The Xpert Xpress SARS-CoV-2/FLU/RSV plus assay is intended as an aid in the diagnosis of influenza from Nasopharyngeal swab specimens and should not be used as a sole basis for treatment. Nasal washings and aspirates are unacceptable for Xpert Xpress SARS-CoV-2/FLU/RSV testing.  Fact Sheet for Patients: EntrepreneurPulse.com.au  Fact Sheet for Healthcare Providers: IncredibleEmployment.be  This test is not yet approved or cleared by the [place] FDA and has been authorized for detection and/or diagnosis of SARS-CoV-2 by FDA under an Emergency Use Authorization (EUA). This EUA will remain in effect (meaning this test can be used) for the duration of the COVID-19 declaration under Section 564(b)(1) of the Act, 21 U.S.C. section 360bbb-3(b)(1), unless the authorization is terminated or revoked.  Performed at [facility], [address] [place], [place] [address]      Labs: BNP (last 3 results) Recent Labs    11/19/20 0701  BNP 58.3   Basic Metabolic Panel: Recent Labs  Lab 11/19/20 0701 11/20/20 0517  NA 139 138  K 2.8* 2.9*  CL 95* 100  CO2 32 29  GLUCOSE 156* 152*  BUN 32* 24*  CREATININE 1.03* 0.81  CALCIUM 9.4 8.6*  MG 1.9 2.0   Liver Function Tests: Recent Labs  Lab 11/19/20 0701  AST 32  ALT 35  ALKPHOS 92  BILITOT 0.6  PROT 7.0  ALBUMIN 3.8   No results for input(s): LIPASE, AMYLASE in the last 168 hours. No results for input(s): AMMONIA in the last 168 hours. CBC: Recent Labs  Lab 11/19/20 0701  WBC 10.1  HGB 12.7  HCT 38.4  MCV 88.9  PLT 237   Cardiac Enzymes: No results for input(s): CKTOTAL, CKMB, CKMBINDEX, TROPONINI in the last 168 hours. BNP: Invalid input(s): POCBNP CBG: No  results for input(s): GLUCAP in the last 168 hours. D-Dimer No results for input(s): DDIMER in the last 72 hours. Hgb A1c Recent Labs    11/19/20 0701  HGBA1C 6.6*   Lipid Profile Recent Labs    11/20/20 0517  CHOL 227*  HDL 37*  LDLCALC UNABLE TO CALCULATE IF TRIGLYCERIDE OVER 400 mg/dL  TRIG 413*  CHOLHDL 6.1   Thyroid function studies Recent Labs    11/19/20 0919  TSH 1.346   Anemia work up No results for input(s): VITAMINB12, FOLATE, FERRITIN, TIBC, IRON, RETICCTPCT in the last 72 hours. Urinalysis No results found for: COLORURINE, APPEARANCEUR, [place], [place], GLUCOSEU, [place], [place], [place], PROTEINUR, UROBILINOGEN, NITRITE, LEUKOCYTESUR Sepsis Labs Invalid input(s): PROCALCITONIN,  WBC,  LACTICIDVEN Microbiology Recent Results (from the past 240 hour(s))  Resp Panel by RT-PCR (Flu A&B, Covid) Nasopharyngeal Swab     Status: None   Collection Time: 11/19/20  1:17 PM   Specimen: Nasopharyngeal Swab; Nasopharyngeal(NP) swabs in vial transport medium  Result Value Ref Range Status   SARS Coronavirus 2 by RT PCR NEGATIVE NEGATIVE Final    Comment: (NOTE) SARS-CoV-2 target nucleic acids are NOT DETECTED.  The SARS-CoV-2 RNA is generally detectable in upper respiratory specimens during the acute phase of infection. The lowest concentration of SARS-CoV-2  viral copies this assay can detect is 138 copies/mL. A negative result does not preclude SARS-Cov-2 infection and should not be used as the sole basis for treatment or other patient management decisions. A negative result may occur with  improper specimen collection/handling, submission of specimen other than nasopharyngeal swab, presence of viral mutation(s) within the areas targeted by this assay, and inadequate number of viral copies(<138 copies/mL). A negative result must be combined with clinical observations, patient history, and epidemiological information. The expected result is Negative.  Fact  Sheet for Patients:  EntrepreneurPulse.com.au  Fact Sheet for Healthcare Providers:  IncredibleEmployment.be  This test is no t yet approved or cleared by the [place] FDA and  has been authorized for detection and/or diagnosis of SARS-CoV-2 by FDA under an Emergency Use Authorization (EUA). This EUA will remain  in effect (meaning this test can be used) for the duration of the COVID-19 declaration under Section 564(b)(1) of the Act, 21 U.S.C.section 360bbb-3(b)(1), unless the authorization is terminated  or revoked sooner.       Influenza A by PCR NEGATIVE NEGATIVE Final   Influenza B by PCR NEGATIVE NEGATIVE Final    Comment: (NOTE) The Xpert Xpress SARS-CoV-2/FLU/RSV plus assay is intended as an aid in the diagnosis of influenza from Nasopharyngeal swab specimens and should not be used as a sole basis for treatment. Nasal washings and aspirates are unacceptable for Xpert Xpress SARS-CoV-2/FLU/RSV testing.  Fact Sheet for Patients: EntrepreneurPulse.com.au  Fact Sheet for Healthcare Providers: IncredibleEmployment.be  This test is not yet approved or cleared by the [place] FDA and has been authorized for detection and/or diagnosis of SARS-CoV-2 by FDA under an Emergency Use Authorization (EUA). This EUA will remain in effect (meaning this test can be used) for the duration of the COVID-19 declaration under Section 564(b)(1) of the Act, 21 U.S.C. section 360bbb-3(b)(1), unless the authorization is terminated or revoked.  Performed at [facility], [address] [place], [place] [address]     Time coordinating discharge:   SIGNED:  Irwin Brakeman, MD  [facility] 11/20/2020, 9:36 AM How to contact the [facility] Attending or Consulting provider [place] or covering provider during after hours [place], for this patient?  1. Check the care team in [facility] and look for a) attending/consulting  TRH provider listed and b) the [facility] team listed 2. Log into [email or web address] and use [place]'s universal password to access. If you do not have the password, please contact the hospital operator. 3. Locate the [facility] provider you are looking for under [facility] and page to a number that you can be directly reached. 4. If you still have difficulty reaching the provider, please page the [facility] (Director on Call) for the Hospitalists listed on amion for assistance.

## 2020-11-20 NOTE — Evaluation (Signed)
Physical Therapy Evaluation Only Patient Details Name: Meagan Reyes MRN: [id number] DOB: [birth year] Today's Date: 11/20/2020   History of Present Illness  Pt is a 65 year old female. Presents with SOB complaints. Negative for acute PE, negative for LLE DVT. PMH: CHF, HTN    Clinical Impression  Pt mobilizing around the room and ambulating in hallway independently without physical assist or cues. Educated pt on improving endurance and activity tolerance by timing ambulation duration and increasing it by 30-60 sec daily working up as tolerable; pt verbalizes understanding. Pt with HR in 70s-80s during ambulation and SpO2 98-100% on RA. Pt with moderate SOB, requiring 1 standing rest break to recover before finishing ambulation. No acute PT needs identified, will sign off at this time.    Follow Up Recommendations No PT follow up    Equipment Recommendations  None recommended by PT (pt is going to borrow tub bench/shower seat from family)    Recommendations for Other Services       Precautions / Restrictions Precautions Precautions: None Restrictions Weight Bearing Restrictions: No      Mobility  Bed Mobility Overal bed mobility: Independent   Transfers Overall transfer level: Independent Equipment used: None   Ambulation/Gait Ambulation/Gait assistance: Modified independent (Device/Increase time) Gait Distance (Feet): 200 Feet (1 standing rest break) Assistive device: IV Pole Gait Pattern/deviations: WFL(Within Functional Limits) Gait velocity: slightly decreased   General Gait Details: pt ambulates with IV pole, step through pattern completing turns and navigating past furniture, moderate SOB, HR 70-80s and SpO2 98-100% on RA  Stairs       Wheelchair Mobility    Modified Rankin (Stroke Patients Only)       Balance Overall balance assessment: No apparent balance deficits (not formally assessed)              Pertinent Vitals/Pain Pain Assessment:  No/denies pain    Home Living Family/patient expects to be discharged to:: Private residence Living Arrangements: Spouse/significant other Available Help at Discharge: Family;Available PRN/intermittently Type of Home: House Home Access: Stairs to enter Entrance Stairs-Rails: None Entrance Stairs-Number of Steps: 3 Home Layout: One level Home Equipment: None      Prior Function Level of Independence: Independent         Comments: Pt reports independent with community ambulation, ADLs/IADLs, drives, denies falls, 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     Hand Dominance   Dominant Hand: Right    Extremity/Trunk Assessment   Upper Extremity Assessment Upper Extremity Assessment: Overall WFL for tasks assessed    Lower Extremity Assessment Lower Extremity Assessment: Overall WFL for tasks assessed (AROM WNL, MMT 4+/5 throughout, denies numbness/tingling)    Cervical / Trunk Assessment Cervical / Trunk Assessment: Normal  Communication   Communication: No difficulties  Cognition Arousal/Alertness: Awake/alert Behavior During Therapy: WFL for tasks assessed/performed Overall Cognitive Status: Within Functional Limits for tasks assessed     General Comments      Exercises     Assessment/Plan    PT Assessment Patent does not need any further PT services  PT Problem List         PT Treatment Interventions      PT Goals (Current goals can be found in the Care Plan section)  Acute Rehab PT Goals Patient Stated Goal: "figure out what is going on" PT Goal Formulation: All assessment and education complete, DC therapy    Frequency     Barriers to discharge        Co-evaluation  AM-PAC PT "6 Clicks" Mobility  Outcome Measure Help needed turning from your back to your side while in a flat bed without using bedrails?: None Help needed moving from lying on your back to sitting on the side of a flat bed without using bedrails?: None Help  needed moving to and from a bed to a chair (including a wheelchair)?: None Help needed standing up from a chair using your arms (e.g., wheelchair or bedside chair)?: None Help needed to walk in hospital room?: None Help needed climbing 3-5 steps with a railing? : None 6 Click Score: 24    End of Session   Activity Tolerance: Patient tolerated treatment well Patient left: in bed;with call bell/phone within reach Nurse Communication: Mobility status PT Visit Diagnosis: Other abnormalities of gait and mobility (R26.89)    Time: 6812-7517 PT Time Calculation (min) (ACUTE ONLY): 22 min   Charges:   PT Evaluation $PT Eval Low Complexity: 1 Low PT Treatments $Self Care/Home Management: 8-22         Tori Danni Shima PT, DPT 11/20/20, 9:27 AM

## 2020-11-20 NOTE — Discharge Instructions (Signed)
Shortness of Breath, Adult Shortness of breath means you have trouble breathing. Shortness of breath could be a sign of a medical problem. Follow these instructions at home:  Watch for any changes in your symptoms.  Do not use any products that contain nicotine or tobacco, such as cigarettes, e-cigarettes, and chewing tobacco.  Do not smoke. Smoking can cause shortness of breath. If you need help to quit smoking, ask your doctor.  Avoid things that can make it harder to breathe, such as: ? Mold. ? Dust. ? Air pollution. ? Chemical smells. ? Things that can cause allergy symptoms (allergens), if you have allergies.  Keep your living space clean. Use products that help remove mold and dust.  Rest as needed. Slowly return to your normal activities.  Take over-the-counter and prescription medicines only as told by your doctor. This includes oxygen therapy and inhaled medicines.  Keep all follow-up visits as told by your doctor. This is important.   Contact a doctor if:  Your condition does not get better as soon as expected.  You have a hard time doing your normal activities, even after you rest.  You have new symptoms. Get help right away if:  Your shortness of breath gets worse.  You have trouble breathing when you are resting.  You feel light-headed or you pass out (faint).  You have a cough that is not helped by medicines.  You cough up blood.  You have pain with breathing.  You have pain in your chest, arms, shoulders, or belly (abdomen).  You have a fever.  You cannot walk up stairs.  You cannot exercise the way you normally do. These symptoms may represent a serious problem that is an emergency. Do not wait to see if the symptoms will go away. Get medical help right away. Call your local emergency services (911 in the [place].). Do not drive yourself to the hospital. Summary  Shortness of breath is when you have trouble breathing enough air. It can be a sign of a  medical problem.  Avoid things that make it hard for you to breathe, such as smoking, pollution, mold, and dust.  Watch for any changes in your symptoms. Contact your doctor if you do not get better or you get worse. This information is not intended to replace advice given to you by your health care provider. Make sure you discuss any questions you have with your health care provider. Document Revised: 12/07/2017 Document Reviewed: 12/07/2017 Elsevier Patient Education  2021 [place].   Bradycardia, Adult Bradycardia is a slower-than-normal heartbeat. A normal resting heart rate for an adult ranges from 60 to 100 beats per minute. With bradycardia, the resting heart rate is less than 60 beats per minute. Bradycardia can prevent enough oxygen from reaching certain areas of your body when you are active. It can be serious if it keeps enough oxygen from reaching your brain and other parts of your body. Bradycardia is not a problem for everyone. For some healthy adults, a slow resting heart rate is normal. What are the causes? This condition may be caused by:  A problem with the heart, including: ? A problem with the heart's electrical system, such as a heart block. With a heart block, electrical signals between the chambers of the heart are partially or completely blocked, so they are not able to work as they should. ? A problem with the heart's natural pacemaker (sinus node). ? Heart disease. ? A heart attack. ? Heart damage. ?  Lyme disease. ? A heart infection. ? A heart condition that is present at birth (congenital heart defect).  Certain medicines that treat heart conditions.  Certain conditions, such as hypothyroidism and obstructive sleep apnea.  Problems with the balance of chemicals and other substances, like potassium, in the blood.  Trauma.  Radiation therapy.   What increases the risk? You are more likely to develop this condition if you:  Are age 63 or  older.  Have high blood pressure (hypertension), high cholesterol (hyperlipidemia), or diabetes.  Drink heavily, use tobacco or nicotine products, or use drugs. What are the signs or symptoms? Symptoms of this condition include:  Light-headedness.  Feeling faint or fainting.  Fatigue and weakness.  Trouble with activity or exercise.  Shortness of breath.  Chest pain (angina).  Drowsiness.  Confusion.  Dizziness. How is this diagnosed? This condition may be diagnosed based on:  Your symptoms.  Your medical history.  A physical exam. During the exam, your health care provider will listen to your heartbeat and check your pulse. To confirm the diagnosis, your health care provider may order tests, such as:  Blood tests.  An electrocardiogram (ECG). This test records the heart's electrical activity. The test can show how fast your heart is beating and whether the heartbeat is steady.  A test in which you wear a portable device (event recorder or Holter monitor) to record your heart's electrical activity while you go about your day.  Anexercise test. How is this treated? Treatment for this condition depends on the cause of the condition and how severe your symptoms are. Treatment may involve:  Treatment of the underlying condition.  Changing your medicines or how much medicine you take.  Having a small, battery-operated device called a pacemaker implanted under the skin. When bradycardia occurs, this device can be used to increase your heart rate and help your heart beat in a regular rhythm. Follow these instructions at home: Lifestyle  Manage any health conditions that contribute to bradycardia as told by your health care provider.  Follow a heart-healthy diet. A nutrition specialist (dietitian) can help educate you about healthy food options and changes.  Follow an exercise program that is approved by your health care provider.  Maintain a healthy weight.  Try  to reduce or manage your stress, such as with yoga or meditation. If you need help reducing stress, ask your health care provider.  Do not use any products that contain nicotine or tobacco, such as cigarettes, e-cigarettes, and chewing tobacco. If you need help quitting, ask your health care provider.  Do not use illegal drugs.  Limit alcohol intake to no more than 1 drink a day for nonpregnant women and 2 drinks a day for men. Be aware of how much alcohol is in your drink. In the [place] one drink equals one 12 oz bottle of beer (355 mL), one 5 oz glass of wine (148 mL), or one 1 oz glass of hard liquor (44 mL).   General instructions  Take over-the-counter and prescription medicines only as told by your health care provider.  Keep all follow-up visits as told by your health care provider. This is important. How is this prevented? In some cases, bradycardia may be prevented by:  Treating underlying medical problems.  Stopping behaviors or medicines that can trigger the condition. Contact a health care provider if you:  Feel light-headed or dizzy.  Almost faint.  Feel weak or are easily fatigued during physical activity.  Experience confusion or  have memory problems. Get help right away if:  You faint.  You have: ? An irregular heartbeat (palpitations). ? Chest pain. ? Trouble breathing. Summary  Bradycardia is a slower-than-normal heartbeat. With bradycardia, the resting heart rate is less than 60 beats per minute.  Treatment for this condition depends on the cause.  Manage any health conditions that contribute to bradycardia as told by your health care provider.  Do not use any products that contain nicotine or tobacco, such as cigarettes, e-cigarettes, and chewing tobacco, and limit alcohol intake.  Keep all follow-up visits as told by your health care provider. This is important. This information is not intended to replace advice given to you by your health care  provider. Make sure you discuss any questions you have with your health care provider. Document Revised: 01/18/2018 Document Reviewed: 12/16/2017 Elsevier Patient Education  2021 [place].     IMPORTANT INFORMATION: PAY CLOSE ATTENTION   PHYSICIAN DISCHARGE INSTRUCTIONS  Follow with Primary care provider  Redmond School, MD  and other consultants as instructed by your Hospitalist Physician  [place] IF SYMPTOMS COME BACK, WORSEN OR NEW PROBLEM DEVELOPS   Please note: You were cared for by a hospitalist during your hospital stay. Every effort will be made to forward records to your primary care provider.  You can request that your primary care provider send for your hospital records if they have not received them.  Once you are discharged, your primary care physician will handle any further medical issues. Please note that NO REFILLS for any discharge medications will be authorized once you are discharged, as it is imperative that you return to your primary care physician (or establish a relationship with a primary care physician if you do not have one) for your post hospital discharge needs so that they can reassess your need for medications and monitor your lab values.  Please get a complete blood count and chemistry panel checked by your Primary MD at your next visit, and again as instructed by your Primary MD.  Get Medicines reviewed and adjusted: Please take all your medications with you for your next visit with your Primary MD  Laboratory/radiological data: Please request your Primary MD to go over all hospital tests and procedure/radiological results at the follow up, please ask your primary care provider to get all Hospital records sent to his/her office.  In some cases, they will be blood work, cultures and biopsy results pending at the time of your discharge. Please request that your primary care provider follow up on these results.  If  you are diabetic, please bring your blood sugar readings with you to your follow up appointment with primary care.    Please call and make your follow up appointments as soon as possible.    Also Note the following: If you experience worsening of your admission symptoms, develop shortness of breath, life threatening emergency, suicidal or homicidal thoughts you must seek medical attention immediately by calling 911 or calling your MD immediately  if symptoms less severe.  You must read complete instructions/literature along with all the possible adverse reactions/side effects for all the Medicines you take and that have been prescribed to you. Take any new Medicines after you have completely understood and accpet all the possible adverse reactions/side effects.   Do not drive when taking Pain medications or sleeping medications (Benzodiazepines)  Do not take more than prescribed Pain, Sleep and Anxiety Medications. It is not advisable to  combine anxiety,sleep and pain medications without talking with your primary care practitioner  Special Instructions: If you have smoked or chewed Tobacco  in the last 2 yrs please stop smoking, stop any regular Alcohol  and or any Recreational drug use.  Wear Seat belts while driving.  Do not drive if taking any narcotic, mind altering or controlled substances or recreational drugs or alcohol.

## 2020-11-22 ENCOUNTER — Encounter: Payer: Self-pay | Admitting: Student

## 2020-11-22 ENCOUNTER — Ambulatory Visit (INDEPENDENT_AMBULATORY_CARE_PROVIDER_SITE_OTHER): Payer: 59 | Admitting: Student

## 2020-11-22 ENCOUNTER — Other Ambulatory Visit: Payer: Self-pay

## 2020-11-22 VITALS — BP 124/78 | HR 70 | Wt 305.0 lb

## 2020-11-22 DIAGNOSIS — I2510 Atherosclerotic heart disease of native coronary artery without angina pectoris: Secondary | ICD-10-CM | POA: Diagnosis not present

## 2020-11-22 DIAGNOSIS — I5032 Chronic diastolic (congestive) heart failure: Secondary | ICD-10-CM | POA: Diagnosis not present

## 2020-11-22 DIAGNOSIS — G4730 Sleep apnea, unspecified: Secondary | ICD-10-CM

## 2020-11-22 DIAGNOSIS — Z79899 Other long term (current) drug therapy: Secondary | ICD-10-CM

## 2020-11-22 DIAGNOSIS — I10 Essential (primary) hypertension: Secondary | ICD-10-CM | POA: Diagnosis not present

## 2020-11-22 NOTE — Progress Notes (Signed)
Cardiology Office Note    Date:  11/23/2020   ID:  Meagan Reyes, DOB 11/04/1955, MRN [id number]  PCP:  Redmond School, MD  Cardiologist: Dorris Carnes, MD    Chief Complaint  Patient presents with  . Hospitalization Follow-up    History of Present Illness:    Meagan Reyes is a 65 y.o. female with past medical history of diastolic dysfunction, CAD (nonobstructive disease by Coronary CT in 05/2019), HTN, HLD and morbid obesity who presents to the office today for evaluation of worsening dyspnea.  She was last examined Dr. Harrington Challenger in 12/2019 and reported still having dyspnea on exertion but denied any associated exertional chest pain or palpitations. Had recently undergone PFT's which were normal and prior Coronary CT had shown mild plaquing with no significant stenosis. Continued weight loss was encouraged and weight was at 319 lbs at that time  She called the office last month reporting worsening dyspnea on exertion for the past [redacted] weeks along with lower extremity edema and a follow-up visit was therefore arranged.  In the interim, she did present to [facility] ED on 11/19/2020 for evaluation of acute worsening dyspnea. Was bradycardiac on admission and Labetalol and Cardizem were held. Hemoglobin was stable at 12.7 and troponin values were negative. BNP was normal at 31. Creatinine was mildly elevated at 1.03 on admission and improved to 0.81 prior to discharge. CXR showed low lung volumes with no acute findings and CTA Chest showed no evidence of an acute PE but was noted to have patchy bilateral groundglass densities which may reflect edema and mild cardiomegaly. Lower extremity dopplers were negative for a DVT. Repeat echocardiogram showed a preserved EF of 65 to 70% with no regional wall motion abnormalities. Diastolic parameters were indeterminate but RV function appeared normal and she did not have any significant valve abnormalities. She did have mild dilatation of the ascending aorta  measuring at 40 mm. Both her Cardizem CD 180 mg daily and Labetalol 300 mg twice daily were discontinued at the time of discharge and HCTZ 50 mg was discontinued.  She was discharged on ASA 81 mg daily and Lasix 40 mg twice daily. Appears she was also referred to the [facility] as her FLP showed total cholesterol 227, triglycerides 413, HDL 37 and LDL 120 and was intolerant to statins in the past.   In talking with the patient today, she reports she has experienced worsening edema along her hands and legs since hospital discharge. Was evaluated by her PCP yesterday and Lasix was titrated to 66m in AM/433min PM and she was also was restarted on HCTZ at that time. She has baseline dyspnea on exertion but says this has improved since hospital discharge as she has been able to walk along a 30 foot ramp at work and has not had to stop since hospital discharge which is new for her as she was previously stopping multiple times with each trip.  No specific orthopnea or PND. She has not been formally tested for sleep apnea but reports using her husband's CPAP with improvement in symptoms but his machine is currently broken.  Past Medical History:  Diagnosis Date  . Chronic diastolic heart failure ([place]  . GERD (gastroesophageal reflux disease)   . Hyperlipemia   . Hypertension     Past Surgical History:  Procedure Laterality Date  . ABDOMINAL HYSTERECTOMY    . BACK SURGERY    . HEEL SPUR EXCISION    . KNEE ARTHROSCOPY WITH  LATERAL MENISECTOMY Right 10/18/2015   Procedure: RIGHT KNEE ARTHROSCOPY WITH LATERAL MENISECTOMY;  Surgeon: [name], MD;  Location: [facility];  Service: Orthopedics;  Laterality: Right;  . TONSILLECTOMY      Current Medications: Outpatient Medications Prior to Visit  Medication Sig Dispense Refill  . aspirin 81 MG chewable tablet Chew 81 mg by mouth at bedtime.     . blood glucose meter kit and supplies Dispense based on patient and insurance preference. Use up to  four times daily as directed. (FOR ICD-10 E10.9, E11.9). 1 each 0  . Cholecalciferol (VITAMIN D3) 25 MCG (1000 UT) CAPS Take 2,000 Units by mouth 2 (two) times daily.    . furosemide (LASIX) 40 MG tablet Take 80 mg by mouth. Take 80 mg am. 40 mg pm    . meloxicam (MOBIC) 15 MG tablet Take 1 tablet by mouth once daily 90 tablet 0  . pantoprazole (PROTONIX) 40 MG tablet Take 40 mg by mouth daily.    . potassium chloride 20 MEQ TBCR Take 20 mEq by mouth 2 (two) times daily. 60 tablet 1  . SUPER B COMPLEX/C PO Take 1 tablet by mouth daily.    . vitamin C (ASCORBIC ACID) 500 MG tablet Take 500 mg by mouth daily.    . Zinc 15 MG CAPS Take 15 mg by mouth daily.    . furosemide (LASIX) 40 MG tablet Take 40 mg by mouth 2 (two) times daily.  0  . metFORMIN (GLUCOPHAGE XR) 500 MG 24 hr tablet Take 1 tab daily with supper x 7 days, then 1 po BID with meals x 7 days, then 2 po BID 120 tablet 1   Facility-Administered Medications Prior to Visit  Medication Dose Route Frequency Provider Last Rate Last Admin  . nortriptyline (PAMELOR) capsule 10 mg  10 mg Oral QHS [name], MD         Allergies:   Atorvastatin, Doxycycline, and Neurontin [gabapentin]   Social History   Socioeconomic History  . Marital status: Married    Spouse name: Not on file  . Number of children: Not on file  . Years of education: Not on file  . Highest education level: Not on file  Occupational History  . Not on file  Tobacco Use  . Smoking status: Former Smoker    Packs/day: 0.10    Years: 0.25    Pack years: 0.02    Start date: 1976    Quit date: 1976    Years since quitting: 46.3  . Smokeless tobacco: Never Used  . Tobacco comment: a couple months when 19 then stopped  Vaping Use  . Vaping Use: Never used  Substance and Sexual Activity  . Alcohol use: No  . Drug use: No  . Sexual activity: Yes    Birth control/protection: Surgical  Other Topics Concern  . Not on file  Social History Narrative  .  Not on file   Social Determinants of Health   Financial Resource Strain: Not on file  Food Insecurity: Not on file  Transportation Needs: Not on file  Physical Activity: Not on file  Stress: Not on file  Social Connections: Not on file     Family History:  The patient's family history includes Asthma in her mother; Atrial fibrillation in her mother; COPD in her mother; Cancer in her father; Hyperlipidemia in her brother, brother, and brother; Leukemia in her mother; Stroke in her mother.   Review of Systems:  Please see the history of present illness.     All other systems reviewed and are otherwise negative except as noted above.   Physical Exam:    VS:  BP 124/78   Pulse 70   Wt (!) 305 lb (138.3 kg)   SpO2 98%   BMI 46.38 kg/m    General: Pleasant female appearing in no acute distress. Head: Normocephalic, atraumatic. Neck: No carotid bruits. JVD difficult to assess.  Lungs: Respirations regular and unlabored, without wheezes or rales.  Heart: Regular rate and rhythm. No S3 or S4.  No murmur, no rubs, or gallops appreciated. Abdomen: Appears non-distended. No obvious abdominal masses. Msk:  Strength and tone appear normal for age. No obvious joint deformities or effusions. Extremities: No clubbing or cyanosis. 1+ pitting edema bilaterally.  Distal pedal pulses are 2+ bilaterally. Neuro: Alert and oriented X 3. Moves all extremities spontaneously. No focal deficits noted. Psych:  Responds to questions appropriately with a normal affect. Skin: No rashes or lesions noted  Wt Readings from Last 3 Encounters:  11/22/20 (!) 305 lb (138.3 kg)  11/19/20 (!) 301 lb 13 oz (136.9 kg)  10/31/20 (!) 311 lb 9.6 oz (141.3 kg)     Studies/Labs Reviewed:   EKG:  EKG is not ordered today.   Recent Labs: 11/19/2020: ALT 35; B Natriuretic Peptide 31.0; Hemoglobin 12.7; Platelets 237; TSH 1.346 11/20/2020: BUN 24; Creatinine, Ser 0.81; Magnesium 2.0; Potassium 2.9; Sodium 138    Lipid Panel    Component Value Date/Time   CHOL 227 (H) 11/20/2020 0517   CHOL 217 (H) 08/17/2019 0838   TRIG 413 (H) 11/20/2020 0517   HDL 37 (L) 11/20/2020 0517   HDL 55 08/17/2019 0838   CHOLHDL 6.1 11/20/2020 0517   VLDL UNABLE TO CALCULATE IF TRIGLYCERIDE OVER 400 mg/dL 11/20/2020 0517   LDLCALC UNABLE TO CALCULATE IF TRIGLYCERIDE OVER 400 mg/dL 11/20/2020 0517   LDLCALC 130 (H) 08/17/2019 0838   LDLDIRECT 120.2 (H) 11/20/2020 0517    Additional studies/ records that were reviewed today include:   Coronary CT: 05/2019 Calcium Score: 1.9 WU  Coronary Arteries: Left dominant with no anomalies  LM: No plaque or stenosis.  LAD system: No plaque or stenosis. Large high D1 covers ramus territory.  Circumflex system: Large, dominant LCx. Mixed plaque ostially with minimal stenosis.  RCA system: Small, nondominant RCA with no plaque or stenosis.  IMPRESSION: 1. Coronary artery calcium score 1.9 WU. This places the patient in the 60th percentile for age and gender, suggesting intermediate risk for future cardiac events.  2.  Nonobstructive mild CAD.   Lower Extremity Dopplers: 10/2020  IMPRESSION: No evidence of deep venous thrombosis in either lower extremity.  Small complex fluid collection at the proximal lower RIGHT leg anterolaterally 1.2 x 2.8 x 4.5 cm question small hematoma.  Echocardiogram: 11/19/2020 IMPRESSIONS    1. Left ventricular ejection fraction, by estimation, is 65 to 70%. The  left ventricle has normal function. The left ventricle has no regional  wall motion abnormalities. Left ventricular diastolic parameters are  indeterminate.  2. Right ventricular systolic function is normal. The right ventricular  size is normal.  3. The mitral valve is normal in structure. No evidence of mitral valve  regurgitation. No evidence of mitral stenosis.  4. The aortic valve is tricuspid. Aortic valve regurgitation is not  visualized. No  aortic stenosis is present.  5. Aortic dilatation noted. There is mild dilatation of the ascending  aorta, measuring 40 mm.  Assessment:    1. Chronic heart failure with preserved ejection fraction ([place])   2. Medication management   3. Coronary artery disease involving native coronary artery of native heart without angina pectoris   4. Essential hypertension   5. Sleep disorder breathing      Plan:   In order of problems listed above:  1. HFpEF - Recently admitted for an acute CHF exacerbation as outlined above but reports weight increased by 5 lbs so her PCP titrated Lasix and restarted HCTZ. Given her variable renal function, I recommended she stop HCTZ for now to avoid dual-diuretic therapy. Will continue on Lasix 76m in AM/436min PM. I encouraged her to follow weight at home and report back if trending up as this may need to be titrated to 808mID or switched to Torsemide. Recheck BMET in 2 weeks. Importance of limiting sodium intake was reviewed.   2. CAD - She had nonobstructive disease by Coronary CT in 05/2019. Remains on ASA 34m57mily. Previously intolerant to statin therapy and referred to the [facility]. It appears she was on Atorvastatin in the past. If not evaluated by the [facility] Clinic in the interim, would recommend considering low-dose Crestor at her next visit to see if she is able to tolerate this.   3. HTN - BP is well-controlled at 124/78 during today's visit but I am concerned how this will trend over the next few weeks given the recent discontinuation of Cardizem, Labetalol and HCTZ. She previously had worsening dyspnea with ACE-I/ARB in the past. Would consider reinitiation of Labetalol in the future of the use of Amlodipine.   4. Sleep Disordered Breathing - She reports using her husband's CPAP with improvement in symptoms but his machine is currently broken. We discussed referral for a sleep study but she plans to discuss this with her PCP at her next visit  [facility]  Medication Adjustments/Labs and Tests Ordered: Current medicines are reviewed at length with the patient today.  Concerns regarding medicines are outlined above.  Medication changes, Labs and Tests ordered today are listed in the Patient Instructions below. Patient Instructions  Medication Instructions:  Your physician has recommended you make the following change in your medication:   Stop taking HCTZ   *If you need a refill on your cardiac medications before your next appointment, please call your pharmacy*   Lab Work: Your physician recommends that you return for lab work in: 2 weeks ( 12/06/20)   If you have labs (blood work) drawn today and your tests are completely normal, you will receive your results only by: . MyMarland Kitchenhart Message (if you have MyChart) OR . A paper copy in the mail If you have any lab test that is abnormal or we need to change your treatment, we will call you to review the results.   Testing/Procedures: NONE    Follow-Up: [facility] and your health needs are our priority.  As part of our continuing mission to provide you with exceptional heart care, we have created designated Provider Care Teams.  These Care Teams include your primary Cardiologist (physician) and Advanced Practice Providers (APPs -  Physician Assistants and Nurse Practitioners) who all work together to provide you with the care you need, when you need it.  We recommend signing up for the patient portal called "MyChart".  Sign up information is provided on this After Visit  Summary.  MyChart is used to connect with patients for Virtual Visits (Telemedicine).  Patients are able to view lab/test results, encounter notes, upcoming appointments, etc.  Non-urgent messages can be sent to your provider as well.   To learn more about what you can do with MyChart, go to  [email or web address].    Your next appointment:    As Planned   The format for your next appointment:   In Person  Provider:   Dorris Carnes, MD   Other Instructions Thank you for choosing [facility]!       Signed, Erma Heritage, PA-C  11/23/2020 10:05 AM    [place] [address] [place], [place] [address] Phone: [phone] Fax: [phone]

## 2020-11-22 NOTE — Patient Instructions (Signed)
Medication Instructions:  Your physician has recommended you make the following change in your medication:   Stop taking HCTZ   *If you need a refill on your cardiac medications before your next appointment, please call your pharmacy*   Lab Work: Your physician recommends that you return for lab work in: 2 weeks ( 12/06/20)   If you have labs (blood work) drawn today and your tests are completely normal, you will receive your results only by: Marland Kitchen MyChart Message (if you have MyChart) OR . A paper copy in the mail If you have any lab test that is abnormal or we need to change your treatment, we will call you to review the results.   Testing/Procedures: NONE    Follow-Up: At [facility], you and your health needs are our priority.  As part of our continuing mission to provide you with exceptional heart care, we have created designated Provider Care Teams.  These Care Teams include your primary Cardiologist (physician) and Advanced Practice Providers (APPs -  Physician Assistants and Nurse Practitioners) who all work together to provide you with the care you need, when you need it.  We recommend signing up for the patient portal called "MyChart".  Sign up information is provided on this After Visit Summary.  MyChart is used to connect with patients for Virtual Visits (Telemedicine).  Patients are able to view lab/test results, encounter notes, upcoming appointments, etc.  Non-urgent messages can be sent to your provider as well.   To learn more about what you can do with MyChart, go to [email or web address].    Your next appointment:    As Planned   The format for your next appointment:   In Person  Provider:   Dorris Carnes, MD   Other Instructions Thank you for choosing [facility]!

## 2020-11-23 ENCOUNTER — Encounter: Payer: Self-pay | Admitting: Student

## 2020-11-28 ENCOUNTER — Ambulatory Visit (INDEPENDENT_AMBULATORY_CARE_PROVIDER_SITE_OTHER): Payer: 59 | Admitting: Physician Assistant

## 2020-11-28 ENCOUNTER — Other Ambulatory Visit: Payer: Self-pay

## 2020-11-28 ENCOUNTER — Ambulatory Visit (HOSPITAL_COMMUNITY)
Admission: RE | Admit: 2020-11-28 | Discharge: 2020-11-28 | Disposition: A | Discharge status: Home / Self Care | Payer: 59 | Source: Ambulatory Visit | Attending: Vascular Surgery | Admitting: Vascular Surgery

## 2020-11-28 VITALS — BP 167/88 | HR 65 | Temp 97.8°F | Resp 20 | Ht 68.0 in | Wt 306.2 lb

## 2020-11-28 DIAGNOSIS — I872 Venous insufficiency (chronic) (peripheral): Secondary | ICD-10-CM | POA: Diagnosis not present

## 2020-11-28 NOTE — Progress Notes (Signed)
Requested by:  Redmond School, [place],  [place] [id number]  Reason for consultation: Lower extremity edema   History of Present Illness   Meagan Reyes is a 65 y.o. ([birth year]) female who presents for evaluation of bilateral lower extremity edema.  The patient states she has noticed progression of lower extremity edema over the course of the last year.  She also complains of itching.  She denies claudication.  She and her husband have a [place] and she stands on concrete floors for long periods of time.  She has no prior history of DVT.  No prior skin breakdown.  Venous symptoms include: positive if (X) [  ] aching [  ] heavy [  ] tired  [  ] throbbing [  ] burning  [ x ] itching [ x ]swelling [  ] bleeding [  ] ulcer  Onset/duration: 1 year Occupation: Woodworking Aggravating factors: Standing  alleviating factors: None  compression: None helps:   Pain medications: None Previous vein procedures: None History of DVT: No  Past Medical History:  Diagnosis Date  . Chronic diastolic heart failure ([place])   . GERD (gastroesophageal reflux disease)   . Hyperlipemia   . Hypertension     Past Surgical History:  Procedure Laterality Date  . ABDOMINAL HYSTERECTOMY    . BACK SURGERY    . HEEL SPUR EXCISION    . KNEE ARTHROSCOPY WITH LATERAL MENISECTOMY Right 10/18/2015   Procedure: RIGHT KNEE ARTHROSCOPY WITH LATERAL MENISECTOMY;  Surgeon: [name], MD;  Location: [facility];  Service: Orthopedics;  Laterality: Right;  . TONSILLECTOMY      Social History   Socioeconomic History  . Marital status: Married    Spouse name: Not on file  . Number of children: Not on file  . Years of education: Not on file  . Highest education level: Not on file  Occupational History  . Not on file  Tobacco Use  . Smoking status: Former Smoker    Packs/day: 0.10    Years: 0.25    Pack years: 0.02    Start date: 1976    Quit date: 1976     Years since quitting: 46.3  . Smokeless tobacco: Never Used  . Tobacco comment: a couple months when 19 then stopped  Vaping Use  . Vaping Use: Never used  Substance and Sexual Activity  . Alcohol use: No  . Drug use: No  . Sexual activity: Yes    Birth control/protection: Surgical  Other Topics Concern  . Not on file  Social History Narrative  . Not on file   Social Determinants of Health   Financial Resource Strain: Not on file  Food Insecurity: Not on file  Transportation Needs: Not on file  Physical Activity: Not on file  Stress: Not on file  Social Connections: Not on file  Intimate Partner Violence: Not on file    Family History  Problem Relation Age of Onset  . Stroke Mother   . Asthma Mother   . Atrial fibrillation Mother   . Leukemia Mother   . COPD Mother   . Cancer Father        lung  . Hyperlipidemia Brother   . Hyperlipidemia Brother   . Hyperlipidemia Brother   . Colon cancer Neg Hx   . Colon polyps Neg Hx     Current Outpatient Medications  Medication Sig Dispense Refill  . aspirin 81 MG chewable tablet Chew 81 mg  by mouth at bedtime.     . blood glucose meter kit and supplies Dispense based on patient and insurance preference. Use up to four times daily as directed. (FOR ICD-10 E10.9, E11.9). 1 each 0  . Cholecalciferol (VITAMIN D3) 25 MCG (1000 UT) CAPS Take 2,000 Units by mouth 2 (two) times daily.    . furosemide (LASIX) 40 MG tablet Take 80 mg by mouth. Take 80 mg am. 40 mg pm    . meloxicam (MOBIC) 15 MG tablet Take 1 tablet by mouth once daily 90 tablet 0  . pantoprazole (PROTONIX) 40 MG tablet Take 40 mg by mouth daily.    . potassium chloride 20 MEQ TBCR Take 20 mEq by mouth 2 (two) times daily. 60 tablet 1  . SUPER B COMPLEX/C PO Take 1 tablet by mouth daily.    . vitamin C (ASCORBIC ACID) 500 MG tablet Take 500 mg by mouth daily.    . Zinc 15 MG CAPS Take 15 mg by mouth daily.     Current Facility-Administered Medications  Medication  Dose Route Frequency Provider Last Rate Last Admin  . nortriptyline (PAMELOR) capsule 10 mg  10 mg Oral QHS [name], MD        Allergies  Allergen Reactions  . Atorvastatin Other (See Comments)    Myalgia; pt reports this occurred in past, was prescribed by different doctor.  . Doxycycline     Shortness or breath one time another time had symptoms of heart attack  . Neurontin [Gabapentin] Rash    REVIEW OF SYSTEMS (negative unless checked):   Cardiac:  '[]'  Chest pain or chest pressure? '[x]'  Shortness of breath upon activity? '[]'  Shortness of breath when lying flat? '[]'  Irregular heart rhythm?  Vascular:  '[]'  Pain in calf, thigh, or hip brought on by walking? '[]'  Pain in feet at night that wakes you up from your sleep? '[]'  Blood clot in your veins? '[x]'  Leg swelling?  Pulmonary:  '[]'  Oxygen at home? '[]'  Productive cough? '[]'  Wheezing?  Neurologic:  '[]'  Sudden weakness in arms or legs? '[]'  Sudden numbness in arms or legs? '[]'  Sudden onset of difficult speaking or slurred speech? '[]'  Temporary loss of vision in one eye? '[]'  Problems with dizziness?  Gastrointestinal:  '[]'  Blood in stool? '[]'  Vomited blood?  Genitourinary:  '[]'  Burning when urinating? '[]'  Blood in urine?  Psychiatric:  '[]'  Major depression  Hematologic:  '[]'  Bleeding problems? '[]'  Problems with blood clotting?  Dermatologic:  '[]'  Rashes or ulcers?  Constitutional:  '[]'  Fever or chills?  Ear/Nose/Throat:  '[]'  Change in hearing? '[]'  Nose bleeds? '[]'  Sore throat?  Musculoskeletal:  '[]'  Back pain? '[]'  Joint pain? '[]'  Muscle pain?   Physical Examination    There were no vitals filed for this visit. There is no height or weight on file to calculate BMI.  General:  WDWN in NAD; vital signs documented above Gait: Not observed HENT: WNL, normocephalic Pulmonary: normal non-labored breathing , without Rales, rhonchi,  wheezing Cardiac: regular HR, without  Murmurs  Skin: with rashes ; erythematous  patch left pretibial area Vascular Exam/Pulses: 2+ dorsalis pedis, posterior tibial pulses bilaterally Extremities: with varicose veins, with reticular veins, with edema, without stasis pigmentation, with lipodermatosclerosis, without ulcers Musculoskeletal: no muscle wasting or atrophy  Neurologic: A&O X 3;  No focal weakness or paresthesias are detected Psychiatric:  The pt has Normal affect.  Non-invasive Vascular Imaging   BLE Venous Insufficiency Duplex  Right:  - No evidence of deep  vein thrombosis seen in the right lower extremity,  from the common femoral through the popliteal veins.  - No evidence of superficial venous thrombosis in the right lower  extremity.  - No evidence of deep vein reflux.  - Superficial vein reflux in the mid calf SSV and in the SFJ and proximal  calf GSV.     Left:  - No evidence of deep vein thrombosis seen in the left lower extremity,  from the common femoral through the popliteal veins.  - No evidence of superficial venous thrombosis in the left lower  extremity.  - No evidence of deep vein reflux.  - No evidence of superficial vein reflux.   Medical Decision Making   KHYLEE ALGEO is a 65 y.o. female who presents with: BLE chronic venous insufficiency, no evidence of deep venous thrombosis or superficial thrombophlebitis.  No significant venous reflux.  No signs or symptoms of arterial insufficiency.  Based on the patient's history and examination, I recommend: Elevation with proper positioning, compression stockings, weight loss, routine exercise.  Encouraged her to try to do as much as she can to prevent standing in one position for long periods of time.  She is given written material in regards to these recommendations.  Follow-up as necessary  Thank you for allowing [place] to participate in this patient's care.   Barbie Banner, PA-C [facility] Office: [phone]  11/28/2020, 9:46 AM  Clinic MD:  Dr. Scot Dock

## 2020-12-05 ENCOUNTER — Telehealth: Payer: Self-pay | Admitting: *Deleted

## 2020-12-05 NOTE — Telephone Encounter (Signed)
Meagan Reyes, please advise if okay to proceed with schedule TCS with Dr. Abbey Chatters, ASA 3? Thanks

## 2020-12-07 ENCOUNTER — Telehealth: Payer: Self-pay

## 2020-12-07 LAB — BASIC METABOLIC PANEL
BUN/Creatinine Ratio: 21 (ref 12–28)
BUN: 20 mg/dL (ref 8–27)
CO2: 24 mmol/L (ref 20–29)
Calcium: 9.6 mg/dL (ref 8.7–10.3)
Chloride: 101 mmol/L (ref 96–106)
Creatinine, Ser: 0.95 mg/dL (ref 0.57–1.00)
Glucose: 108 mg/dL — ABNORMAL HIGH (ref 65–99)
Potassium: 3.6 mmol/L (ref 3.5–5.2)
Sodium: 142 mmol/L (ref 134–144)
eGFR: 67 mL/min/{1.73_m2} (ref >59)

## 2020-12-07 NOTE — Telephone Encounter (Signed)
Pt verbalized understanding and had no questions or concerns at this time.  

## 2020-12-07 NOTE — Telephone Encounter (Signed)
-----   Message from Imogene Burn, PA-C sent at 12/07/2020  8:14 AM EDT ----- Covering for [place]. Labs stable. Continue current treatment.

## 2020-12-12 NOTE — Telephone Encounter (Signed)
Appropriate. ASA 3.

## 2020-12-12 NOTE — Telephone Encounter (Signed)
Looks like she has been evaluated by cardiology.  Assuming it is okay with them I think it is reasonable to schedule patient for colonoscopy, ASA 3.  Thank you

## 2020-12-13 NOTE — Telephone Encounter (Signed)
Called pt, she was driving and will call back.

## 2020-12-14 ENCOUNTER — Other Ambulatory Visit: Payer: Self-pay

## 2020-12-14 ENCOUNTER — Ambulatory Visit (INDEPENDENT_AMBULATORY_CARE_PROVIDER_SITE_OTHER): Payer: 59 | Admitting: Pharmacist

## 2020-12-14 DIAGNOSIS — E781 Pure hyperglyceridemia: Secondary | ICD-10-CM | POA: Diagnosis not present

## 2020-12-14 MED ORDER — ROSUVASTATIN CALCIUM 5 MG PO TABS: 5.0000 mg | ORAL_TABLET | Freq: Every day | ORAL | 11 refills | Status: DC

## 2020-12-14 NOTE — Progress Notes (Addendum)
Patient ID: Meagan Reyes                 DOB: 04/17/1956                    MRN: [id number]     HPI: Meagan Reyes is a 65 y.o. female patient referred to lipid clinic by Dr Meagan Reyes. 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 She presented to the ED 11/19/20 for acute worsening dyspnea. Labetalol and diltiazem were stopped due to bradycardia, HCTZ 85m also stopped and pt discharged on aspirin and Lasix 415mBID, pt also referred to lipid clinic due to elevated TG and statin intolerance. She was also newly diagnosed with DM with A1c of 6.6%. She was referred to outpatient DM education and started on metformin. At follow up visit 11/22/20, reported worsening edema in her hands and legs. Her PCP had restarted HCTZ and increased Lasix to 8021mM and 73m110m. HCTZ was stopped again due to variable renal function with plan to increase Lasix further or change to torsemide if needed.  Pt reports experiencing joint pain on atorvastatin within a few days of starting the medication. She has not tried other medications for her cholesterol. Used to take essential oils to help. Rosuvastatin and ezetimibe were both prescribed in 2021 but pt reports never taking them. She does have joint pain at baseline. Reports eating fish makes this worse. Teaches a woodworking class at [employer] experiencing SOB, has seen Dr Meagan Novash pulmonology. Recommended COVID vaccine which pt does not wish to receive.  She has a strong family history of HLD. 3 brothers and 1 sister all have high cholesterol. Her mother had a stroke a few years ago at age 65, 78e passed this past Christmas. Father had an MI, passed from lung cancer in 2010.  She didn't start metformin as her PCP doesn't think lab draw in the hospital was accurate since her A1c was 5.6 on 08/08/20. She did have 2  prior A1cs of 6.6 in July 2019 and 6.5 in August 2021, so it may be that the PCP drawn lab earlier this year was actually incorrect. PCP will continue to follow this.  She also started on amlodipine today per her PCP as her BP was 189/101. PCP also advised her to change from fish oil to flax seed supplementation.  Current Medications: none Intolerances: atorvastatin - myalgias Risk Factors: nonobstructive CAD, obesity LDL goal: <70mg41m Diet: Likes 1 egg, 1 piece of sausage and 2 pieces of toast for breakfast. Salads for lunch. Lighter dinner to avoid reflux. Eating out more now but choosing grilled chicken salads. Minimal alcohol. Drinks coffee with sugar free creamer or water.   Exercise: Big ramp at work that she goes up and down frequently.  Family History: Asthma in her mother; Atrial fibrillation in her mother; COPD in her mother; Cancer in her father; Hyperlipidemia in her brother, brother, and brother; Leukemia in her mother; Stroke in her mother.   Social History: Former smoker for a few months at age 63, s24rted and quit in 1976. Denies alcohol and drug use.  Labs: 11/20/20: TC 227, TG 413, HDL 37, LDL-D 120, AST 32, ALT 35 (no lipid lowering therapy) 02/21/20: TC 228, TG 197, HDL 50, LDL 143  Past Medical History:  Diagnosis Date   Chronic diastolic heart  failure (HCC)    GERD (gastroesophageal reflux disease)    Hyperlipemia    Hypertension     Current Outpatient Medications on File Prior to Visit  Medication Sig Dispense Refill   aspirin 81 MG chewable tablet Chew 81 mg by mouth at bedtime.      blood glucose meter kit and supplies Dispense based on patient and insurance preference. Use up to four times daily as directed. (FOR ICD-10 E10.9, E11.9). 1 each 0   Cholecalciferol (VITAMIN D3) 25 MCG (1000 UT) CAPS Take 2,000 Units by mouth 2 (two) times daily.     furosemide (LASIX) 40 MG tablet Take 80 mg by mouth. Take 80 mg am. 40 mg pm     meloxicam (MOBIC) 15 MG tablet  Take 1 tablet by mouth once daily 90 tablet 0   pantoprazole (PROTONIX) 40 MG tablet Take 40 mg by mouth daily.     potassium chloride 20 MEQ TBCR Take 20 mEq by mouth 2 (two) times daily. 60 tablet 1   Specialty Vitamins Products (MAGNESIUM, AMINO ACID CHELATE,) 133 MG tablet Take 1 tablet by mouth 2 (two) times daily.     SUPER B COMPLEX/C PO Take 1 tablet by mouth daily.     vitamin C (ASCORBIC ACID) 500 MG tablet Take 500 mg by mouth daily.     Zinc 15 MG CAPS Take 15 mg by mouth daily.     Current Facility-Administered Medications on File Prior to Visit  Medication Dose Route Frequency Provider Last Rate Last Admin   nortriptyline (PAMELOR) capsule 10 mg  10 mg Oral QHS [name], MD        Allergies  Allergen Reactions   Atorvastatin Other (See Comments)    Myalgia; pt reports this occurred in past, was prescribed by different doctor.   Doxycycline     Shortness or breath one time another time had symptoms of heart attack   Neurontin [Gabapentin] Rash    Assessment/Plan:  1. Hyperlipidemia - Baseline LDL 120 above goal < 70 due to hx of nonobstructive CAD. TG also elevated in the hospital at 413, although pt was not fasting and TG last fall were 197. She is intolerant to atorvastatin but has not tried any other meds to lower her cholesterol. Will start rosuvastatin 34m daily. Discussed ezetimibe or Repatha if pt does not tolerate rosuvastatin. Her PCP will recheck labs in ~2 months. If TG remain elevated, can discuss adding fenofibrate. Encouraged pt to eat heart healthy diet and stay active as able.  Meagan Reyes, PharmD, BCACP, [address] [place] [address]Phone: [phone] Fax: [phone]/27/2022 2:21 PM

## 2020-12-14 NOTE — Patient Instructions (Addendum)
Your LDL cholesterol was 120 in the hospital and your goal is < 70  Your triglycerides were 413 and your goal is < 150  Start low dose rosuvastatin 5mg  once daily  Focus on limiting intake of saturated fats, carbs, and sugar in your diet. Stay active as able  Have your primary care doctor recheck your lipid panel and liver enzymes in ~2 months. Call Jinny Blossom, PharmD if you have any trouble tolerating the rosuvastatin [phone]. We can try another medication like ezetimibe (daily pill that lowers your LDL by 20%) or Repatha (injection given every 2 weeks that lowers your LDL by 60%)

## 2020-12-18 NOTE — Telephone Encounter (Signed)
Letter mailed

## 2021-01-10 ENCOUNTER — Ambulatory Visit (INDEPENDENT_AMBULATORY_CARE_PROVIDER_SITE_OTHER): Payer: 59 | Admitting: Internal Medicine

## 2021-01-10 ENCOUNTER — Encounter: Payer: Self-pay | Admitting: Internal Medicine

## 2021-01-10 ENCOUNTER — Other Ambulatory Visit: Payer: Self-pay

## 2021-01-10 VITALS — BP 158/100 | HR 81 | Ht 68.0 in | Wt 304.6 lb

## 2021-01-10 DIAGNOSIS — Z79899 Other long term (current) drug therapy: Secondary | ICD-10-CM

## 2021-01-10 MED ORDER — ENTRESTO 24-26 MG PO TABS: 1.0000 | ORAL_TABLET | Freq: Two times a day (BID) | ORAL | 3 refills | Status: DC

## 2021-01-10 NOTE — Patient Instructions (Signed)
Medication Instructions:  Your physician has recommended you make the following change in your medication:  START ENTRESTO 24-26 mg tablets  *If you need a refill on your cardiac medications before your next appointment, please call your pharmacy*   Lab Work: BMET BNP  If you have labs (blood work) drawn today and your tests are completely normal, you will receive your results only by: [place] (if you have MyChart) OR A paper copy in the mail If you have any lab test that is abnormal or we need to change your treatment, we will call you to review the results.   Testing/Procedures: None   Follow-Up: At [facility], you and your health needs are our priority.  As part of our continuing mission to provide you with exceptional heart care, we have created designated Provider Care Teams.  These Care Teams include your primary Cardiologist (physician) and Advanced Practice Providers (APPs -  Physician Assistants and Nurse Practitioners) who all work together to provide you with the care you need, when you need it.  We recommend signing up for the patient portal called "MyChart".  Sign up information is provided on this After Visit Summary.  MyChart is used to connect with patients for Virtual Visits (Telemedicine).  Patients are able to view lab/test results, encounter notes, upcoming appointments, etc.  Non-urgent messages can be sent to your provider as well.   To learn more about what you can do with MyChart, go to [email or web address].    Your next appointment:   4 week(s)  The format for your next appointment:   In Person  Provider:   Dorris Carnes, MD   Other Instructions

## 2021-01-10 NOTE — Progress Notes (Signed)
Cardiology Office Note   Date:  01/10/2021   ID:  Meagan Reyes, DOB 07-Nov-1955, MRN [id number]  PCP:  Redmond School, MD  Cardiologist:   Dorris Carnes, MD   Pt presents for  follow up  of dyspnea     History of Present Illness: Meagan Reyes is a 65 y.o. female with a history of morbid obesity, SOB and stabbing L chest pain/arm pain  I saw her for th efirst time in Sept 2020   After that visit an echo was done that showed normal systolic function, mild diastolic dysfunciotn   Coronary CT angiogram showed mild plaquing   Ca score was 19    the pt also had PFT which were normal   Lasixin increased for LE edema   I lat saw the pt in clinic in Summer 2021     The pt was admitted to [facility] on 11/20/20   for SOB    No CP  EMS called  HR 40s  Sat 98% RA   A CT was done and was  negative for PE or lung probmes  Meds adjusted   She as taken off of dilt and labetaolol given low HR  Echo showed normal LVEF    She as given IV hydration for AKI    Since discharge the pt was seen my M Supple for lipids   Started on Crestor   She stopped because of achinesss  The pt denies CP   She still gets SOB with activity   Also notes swelling particularly in L leg (having problems with gout in L foot) She is discouraged because she cannot lose wt    BP logs from home   BP 130s to 190s/   HR 60s to 90s    Diet   Br:  Oatmeal   or egg/sausage/toast Lunch   Grilled check salad Statistician and veg Drinks:  Water, Propel, coffee with splenda       Current Meds  Medication Sig   amLODipine (NORVASC) 10 MG tablet Take 10 mg by mouth daily.   aspirin 81 MG chewable tablet Chew 81 mg by mouth at bedtime.    blood glucose meter kit and supplies Dispense based on patient and insurance preference. Use up to four times daily as directed. (FOR ICD-10 E10.9, E11.9).   Cholecalciferol (VITAMIN D3) 25 MCG (1000 UT) CAPS Take 2,000 Units by mouth 2 (two) times daily.   Flaxseed, Linseed, (FLAX SEED OIL) 1000 MG CAPS  Take 1 capsule by mouth daily.   furosemide (LASIX) 40 MG tablet Take 80 mg by mouth. Take 80 mg am. 40 mg pm   hydrochlorothiazide (HYDRODIURIL) 50 MG tablet Take 50 mg by mouth daily.   meloxicam (MOBIC) 15 MG tablet Take 1 tablet by mouth once daily   pantoprazole (PROTONIX) 40 MG tablet Take 40 mg by mouth daily.   potassium chloride 20 MEQ TBCR Take 20 mEq by mouth 2 (two) times daily.   Specialty Vitamins Products (MAGNESIUM, AMINO ACID CHELATE,) 133 MG tablet Take 1 tablet by mouth 2 (two) times daily.   SUPER B COMPLEX/C PO Take 1 tablet by mouth daily.   vitamin C (ASCORBIC ACID) 500 MG tablet Take 500 mg by mouth daily.   Zinc 15 MG CAPS Take 15 mg by mouth daily.   Current Facility-Administered Medications for the 01/10/21 encounter (Office Visit) with Fay Records, MD  Medication   nortriptyline (PAMELOR) capsule 10 mg  Allergies:   Atorvastatin, Doxycycline, and Neurontin [gabapentin]   Past Medical History:  Diagnosis Date   Chronic diastolic heart failure (HCC)    GERD (gastroesophageal reflux disease)    Hyperlipemia    Hypertension     Past Surgical History:  Procedure Laterality Date   ABDOMINAL HYSTERECTOMY     BACK SURGERY     HEEL SPUR EXCISION     KNEE ARTHROSCOPY WITH LATERAL MENISECTOMY Right 10/18/2015   Procedure: RIGHT KNEE ARTHROSCOPY WITH LATERAL MENISECTOMY;  Surgeon: [name], MD;  Location: [facility];  Service: Orthopedics;  Laterality: Right;   TONSILLECTOMY       Social History:  The patient  reports that she quit smoking about 45 years ago. She started smoking about 46 years ago. She has a 0.03 pack-year smoking history. She has never used smokeless tobacco. She reports that she does not drink alcohol and does not use drugs.   Family History:  The patient's family history includes Asthma in her mother; Atrial fibrillation in her mother; COPD in her mother; Cancer in her father; Hyperlipidemia in her brother, brother, and brother;  Leukemia in her mother; Stroke in her mother.         ROS:  Please see the history of present illness. All other systems are reviewed and  Negative to the above problem except as noted.    PHYSICAL EXAM: VS:  BP (!) 158/100   Pulse 81   Ht 5' 8" (1.727 m)   Wt (!) 304 lb 9.6 oz (138.2 kg)   SpO2 96%   BMI 46.31 kg/m   GEN: Morbidly obese 64yo in no acute distress  HEENT: normal  Neck: JVP does not appear elevated   Cardiac: RRR; no murmurs, rubs, or gallops, 1+ LLE edema  Respiratory:  clear to auscultation bilaterally, normal work of breathing GI: soft, nontender, nondistended, + BS  No hepatomegaly  MS: no deformity Moving all extremities   Skin: warm and dry, no rash Neuro:  Strength and sensation are intact Psych: euthymic mood, full affect   EKG:  EKG is ordered today  Sinus bradycardia 54 bpm     Lipid Panel    Component Value Date/Time   CHOL 227 (H) 11/20/2020 0517   CHOL 217 (H) 08/17/2019 0838   TRIG 413 (H) 11/20/2020 0517   HDL 37 (L) 11/20/2020 0517   HDL 55 08/17/2019 0838   CHOLHDL 6.1 11/20/2020 0517   VLDL UNABLE TO CALCULATE IF TRIGLYCERIDE OVER 400 mg/dL 11/20/2020 0517   LDLCALC UNABLE TO CALCULATE IF TRIGLYCERIDE OVER 400 mg/dL 11/20/2020 0517   LDLCALC 130 (H) 08/17/2019 0838   LDLDIRECT 120.2 (H) 11/20/2020 0517      Wt Readings from Last 3 Encounters:  01/10/21 (!) 304 lb 9.6 oz (138.2 kg)  11/28/20 (!) 306 lb 3.2 oz (138.9 kg)  11/22/20 (!) 305 lb (138.3 kg)      ASSESSMENT AND PLAN:  1 .  Dyspnea   Pt remains dyspneic   Volume status is difficult due to her wt    Watch Na (sausage)    Keep on same meds    I would recomm adding Entresto for BP control  Check BMET and BNP in 10 days   Consider Jadiance  will need to review with C Gherghe (endocrine)  2  Reviewed diet Recomm pt cut back on carbs, Na   Consider more time restricted eating     3  HL  Did not tolerate atorvastatin or rosuvastatin  Once BP stabilized will refer for  consideration of Repatha 4  HTN   BP has been high   Her meds have been adjusted by Dr Gerarda Fraction   I would add Entresto   F/U labs    Will recomm f/u in 1 month      4.  Morbid obesity. Encouraged her to lose wt   Would favor stomach rapping if she wats    Stay active    Signed, Dorris Carnes, MD  01/10/2021 1:15 PM    [facility] [address], [place], [place]  [address] Phone: [phone]; Fax: [phone]

## 2021-01-22 ENCOUNTER — Other Ambulatory Visit: Payer: Self-pay | Admitting: Internal Medicine

## 2021-01-23 LAB — PRO B NATRIURETIC PEPTIDE: NT-Pro BNP: 92 pg/mL (ref 0–287)

## 2021-01-28 ENCOUNTER — Telehealth: Payer: Self-pay | Admitting: Internal Medicine

## 2021-01-28 MED ORDER — SACUBITRIL-VALSARTAN 49-51 MG PO TABS: 1.0000 | ORAL_TABLET | Freq: Two times a day (BID) | ORAL | 11 refills | Status: DC

## 2021-01-28 NOTE — Telephone Encounter (Signed)
Spoke with patient. She will increase Entresto to 49-51 mg BID.  She will monitor BPs and bring to next appointment 02/11/21.  Reports still holding a lot of fluid in her legs and her husband thinks she should stay home w feet up instead of walking around at work.  I adv that the activity is beneficial and helps circulate and rid body of fluid.  Adv sitting home all day with little to no activity is not advised.  She is in agreement with this.    She is skipping the afternoon dose of lasix many days and is not following low sodium diet strictly each day.  She continues to work on this daily.

## 2021-01-28 NOTE — Telephone Encounter (Signed)
Reviewed BP readings that she reported   They are better   Still above 140 usually    I would increase Entresto to 49/51 bid     Keep following BP

## 2021-01-28 NOTE — Addendum Note (Signed)
Addended by: Rodman Key on: 01/28/2021 04:36 PM   Modules accepted: Orders

## 2021-02-10 NOTE — Progress Notes (Signed)
Cardiology Office Note   Date:  02/11/2021   ID:  Meagan Reyes, DOB [birth year], MRN [id number]  PCP:  Redmond School, MD  Cardiologist:   Dorris Carnes, MD   Pt presents for f/u eval of SOB   History of Present Illness: Meagan Reyes is a 65 y.o. female with a history of chest  pain  I saw her for th efirst time in Sept 2020   After that visit an echo showed normal systolic function, mild diastolic dysfunciotn   Coronary CT angiogram showed mild plaquing   Ca score was 19   The pt also had PFT which were normal   Lasixin increased for LE edema   The pt was admitted to [facility] on 11/20/20   for SOB    No CP  EMS called  HR 40s  Sat 98% RA   A CT was done and was  negative for PE or lung probems  Meds adjusted   She as taken off of dilt and labetaolol given low HR  Echo showed normal LVEF    She as given IV hydration for AKI     The pt was seen in lipid clinic by Sundra Aland   Placed on Crestor   THis was stopped due to achiness  I sas the pt in June2022  She was still SOB   L Leg swollen   BP was sill elevated,    I recomm adding Entresto to regimen  Since seen she has been doing better   Breathing is better   BP is better   She is doing more walking          Current Meds  Medication Sig   amLODipine (NORVASC) 10 MG tablet Take 10 mg by mouth daily.   aspirin 81 MG chewable tablet Chew 81 mg by mouth at bedtime.    blood glucose meter kit and supplies Dispense based on patient and insurance preference. Use up to four times daily as directed. (FOR ICD-10 E10.9, E11.9).   Cholecalciferol (VITAMIN D3) 25 MCG (1000 UT) CAPS Take 2,000 Units by mouth 2 (two) times daily.   Flaxseed, Linseed, (FLAX SEED OIL) 1000 MG CAPS Take 1 capsule by mouth daily.   furosemide (LASIX) 40 MG tablet Take 80 mg by mouth. Take 80 mg am. 40 mg pm   hydrochlorothiazide (HYDRODIURIL) 25 MG tablet Take 25 mg by mouth daily.   meloxicam (MOBIC) 15 MG tablet Take 1 tablet by mouth once daily   pantoprazole  (PROTONIX) 40 MG tablet Take 40 mg by mouth daily.   potassium chloride 20 MEQ TBCR Take 20 mEq by mouth 2 (two) times daily.   sacubitril-valsartan (ENTRESTO) 49-51 MG Take 1 tablet by mouth 2 (two) times daily.   Specialty Vitamins Products (MAGNESIUM, AMINO ACID CHELATE,) 133 MG tablet Take 1 tablet by mouth 2 (two) times daily.   SUPER B COMPLEX/C PO Take 1 tablet by mouth daily.   vitamin C (ASCORBIC ACID) 500 MG tablet Take 500 mg by mouth daily.   Zinc 15 MG CAPS Take 15 mg by mouth daily.   [DISCONTINUED] hydrochlorothiazide (HYDRODIURIL) 50 MG tablet Take 25 mg by mouth daily.   Current Facility-Administered Medications for the 02/11/21 encounter (Office Visit) with Fay Records, MD  Medication   nortriptyline (PAMELOR) capsule 10 mg     Allergies:   Atorvastatin, Doxycycline, and Neurontin [gabapentin]   Past Medical History:  Diagnosis Date   Chronic diastolic heart failure (  [place])    GERD (gastroesophageal reflux disease)    Hyperlipemia    Hypertension     Past Surgical History:  Procedure Laterality Date   ABDOMINAL HYSTERECTOMY     BACK SURGERY     HEEL SPUR EXCISION     KNEE ARTHROSCOPY WITH LATERAL MENISECTOMY Right 10/18/2015   Procedure: RIGHT KNEE ARTHROSCOPY WITH LATERAL MENISECTOMY;  Surgeon: [name], MD;  Location: [facility];  Service: Orthopedics;  Laterality: Right;   TONSILLECTOMY       Social History:  The patient  reports that she quit smoking about 45 years ago. She started smoking about 46 years ago. She has a 0.03 pack-year smoking history. She has never used smokeless tobacco. She reports that she does not drink alcohol and does not use drugs.   Family History:  The patient's family history includes Asthma in her mother; Atrial fibrillation in her mother; COPD in her mother; Cancer in her father; Hyperlipidemia in her brother, brother, and brother; Leukemia in her mother; Stroke in her mother.    ROS:  Please see the history of present  illness. All other systems are reviewed and  Negative to the above problem except as noted.    PHYSICAL EXAM: VS:  BP (!) 140/92   Pulse 78   Ht $R'5\' 8"'TO$  (1.727 m)   Wt (!) 307 lb 9.6 oz (139.5 kg)   SpO2 96%   BMI 46.77 kg/m   GEN: Morbidly obese 65 yo in no acute distress  HEENT: normal  Neck: no JVD, no carotid bruits Cardiac: RRR; no murmurs  Triv LE edema  Respiratory:  clear to auscultation bilaterally,  GI: soft, nontender MS: no deformity Moving all extremities   Skin: warm and dry  Mild erythema in legs  Neuro:  Grossly intact Psych: euthymic mood, full affect   EKG:  EKG isnot  ordered today.   Lipid Panel    Component Value Date/Time   CHOL 227 (H) 11/20/2020 0517   CHOL 217 (H) 08/17/2019 0838   TRIG 413 (H) 11/20/2020 0517   HDL 37 (L) 11/20/2020 0517   HDL 55 08/17/2019 0838   CHOLHDL 6.1 11/20/2020 0517   VLDL UNABLE TO CALCULATE IF TRIGLYCERIDE OVER 400 mg/dL 11/20/2020 0517   LDLCALC UNABLE TO CALCULATE IF TRIGLYCERIDE OVER 400 mg/dL 11/20/2020 0517   LDLCALC 130 (H) 08/17/2019 0838   LDLDIRECT 120.2 (H) 11/20/2020 0517      Wt Readings from Last 3 Encounters:  02/11/21 (!) 307 lb 9.6 oz (139.5 kg)  01/10/21 (!) 304 lb 9.6 oz (138.2 kg)  11/28/20 (!) 306 lb 3.2 oz (138.9 kg)      ASSESSMENT AND PLAN:  1  HTN   PT is on multiple antihypertensives  last Visit I recomm Entresto   Currently on 49/51    BP is better but not optimal Recom:   Keep on Entresto  Keep on amlodipine   Keep on furosemide     Stop HCTZ  Cut back on KCL Add 25 mg Aldactone     F/U BMET in 10 days      Send in BP readings  2  DOE   Extensive work up   Pt says it actuatlly is getting better   ? Related to HTN in setting of morbid obesity      3  morbid obesity   Discussed diet, TRE       Signed, Dorris Carnes, MD  02/11/2021 9:10 AM    [facility] [address], [place], [place]  [address] Phone: [phone]; Fax: [phone]

## 2021-02-11 ENCOUNTER — Encounter: Payer: Self-pay | Admitting: Internal Medicine

## 2021-02-11 ENCOUNTER — Ambulatory Visit (INDEPENDENT_AMBULATORY_CARE_PROVIDER_SITE_OTHER): Payer: 59 | Admitting: Internal Medicine

## 2021-02-11 ENCOUNTER — Other Ambulatory Visit: Payer: Self-pay

## 2021-02-11 VITALS — BP 140/92 | HR 78 | Ht 68.0 in | Wt 307.6 lb

## 2021-02-11 DIAGNOSIS — Z79899 Other long term (current) drug therapy: Secondary | ICD-10-CM

## 2021-02-11 MED ORDER — SPIRONOLACTONE 25 MG PO TABS: 25.0000 mg | ORAL_TABLET | Freq: Every day | ORAL | 3 refills | Status: DC

## 2021-02-11 MED ORDER — POTASSIUM CHLORIDE CRYS ER 20 MEQ PO TBCR: 20.0000 meq | EXTENDED_RELEASE_TABLET | Freq: Every day | ORAL | 3 refills | Status: DC

## 2021-02-11 NOTE — Patient Instructions (Signed)
Medication Instructions:   Stop Taking HCTZ  Decrease Potassium to 20 meq Daily  Start Aldactone 25 mg Daily   *If you need a refill on your cardiac medications before your next appointment, please call your pharmacy*   Lab Work: Your physician recommends that you return for lab work in: 10 Days ( 02/20/21)    If you have labs (blood work) drawn today and your tests are completely normal, you will receive your results only by: [place] (if you have [place]) OR A paper copy in the mail If you have any lab test that is abnormal or we need to change your treatment, we will call you to review the results.   Testing/Procedures: NONE    Follow-Up: At [facility], you and your health needs are our priority.  As part of our continuing mission to provide you with exceptional heart care, we have created designated Provider Care Teams.  These Care Teams include your primary Cardiologist (physician) and Advanced Practice Providers (APPs -  Physician Assistants and Nurse Practitioners) who all work together to provide you with the care you need, when you need it.  We recommend signing up for the patient portal called "MyChart".  Sign up information is provided on this After Visit Summary.  MyChart is used to connect with patients for Virtual Visits (Telemedicine).  Patients are able to view lab/test results, encounter notes, upcoming appointments, etc.  Non-urgent messages can be sent to your provider as well.   To learn more about what you can do with MyChart, go to [email or web address].    Your next appointment:   3 month(s)  The format for your next appointment:   In Person  Provider:   Dorris Carnes, MD   Other Instructions Thank you for choosing [facility]!

## 2021-02-23 LAB — BASIC METABOLIC PANEL
BUN/Creatinine Ratio: 28 (ref 12–28)
BUN: 24 mg/dL (ref 8–27)
CO2: 24 mmol/L (ref 20–29)
Calcium: 9.5 mg/dL (ref 8.7–10.3)
Chloride: 99 mmol/L (ref 96–106)
Creatinine, Ser: 0.86 mg/dL (ref 0.57–1.00)
Glucose: 124 mg/dL — ABNORMAL HIGH (ref 65–99)
Potassium: 4 mmol/L (ref 3.5–5.2)
Sodium: 141 mmol/L (ref 134–144)
eGFR: 76 mL/min/{1.73_m2} (ref >59)

## 2021-02-27 ENCOUNTER — Telehealth: Payer: Self-pay | Admitting: Pharmacist

## 2021-02-27 DIAGNOSIS — E781 Pure hyperglyceridemia: Secondary | ICD-10-CM

## 2021-02-27 NOTE — Telephone Encounter (Signed)
Called pt to follow up regarding lipid labs. States she hasn't had labs checked at PCP.  States she took rosuvastatin '5mg'$  for 3 days and developed joint pain in her hands. Previously intolerant to atorvastatin. Discussed trying ezetimibe or PCSK9i therapy. Pt states she started a turmeric-essential oil combination supplement and wants to take this for a month before considering prescription medication. Advised her that turmeric and essential oils do not lower cholesterol. She still wants to take them for a month and recheck her cholesterol. Adding on an A1c as well as she reports PCP did not trust A1c of 6.6 drawn in May because her A1c was 5.6 in January. She did have 2 prior A2cs of 6.5 and 6.6 in 2019 and 2021.  She'll have labs checked at [facility] near her home. Will call pt in a month after labs result.

## 2021-03-20 ENCOUNTER — Other Ambulatory Visit (HOSPITAL_COMMUNITY): Payer: Self-pay | Admitting: Physician Assistant

## 2021-03-20 DIAGNOSIS — N63 Unspecified lump in unspecified breast: Secondary | ICD-10-CM

## 2021-04-04 ENCOUNTER — Other Ambulatory Visit: Payer: Self-pay

## 2021-04-04 ENCOUNTER — Ambulatory Visit (HOSPITAL_COMMUNITY)
Admission: RE | Admit: 2021-04-04 | Discharge: 2021-04-04 | Disposition: A | Discharge status: Home / Self Care | Payer: 59 | Source: Ambulatory Visit | Attending: Physician Assistant | Admitting: Physician Assistant

## 2021-04-04 ENCOUNTER — Encounter (HOSPITAL_COMMUNITY): Payer: Self-pay

## 2021-04-04 DIAGNOSIS — N63 Unspecified lump in unspecified breast: Secondary | ICD-10-CM

## 2021-04-17 ENCOUNTER — Other Ambulatory Visit: Payer: Self-pay | Admitting: Internal Medicine

## 2021-04-24 ENCOUNTER — Other Ambulatory Visit (HOSPITAL_COMMUNITY): Payer: Self-pay | Admitting: Internal Medicine

## 2021-04-24 ENCOUNTER — Other Ambulatory Visit: Payer: Self-pay | Admitting: Internal Medicine

## 2021-04-24 ENCOUNTER — Encounter: Payer: Self-pay | Admitting: Internal Medicine

## 2021-04-24 DIAGNOSIS — S1984XD Other specified injuries of thyroid gland, subsequent encounter: Secondary | ICD-10-CM

## 2021-04-25 ENCOUNTER — Other Ambulatory Visit: Payer: Self-pay

## 2021-04-25 ENCOUNTER — Telehealth: Payer: Self-pay | Admitting: Pharmacist

## 2021-04-25 ENCOUNTER — Ambulatory Visit (INDEPENDENT_AMBULATORY_CARE_PROVIDER_SITE_OTHER): Payer: 59 | Admitting: Podiatry

## 2021-04-25 ENCOUNTER — Ambulatory Visit (INDEPENDENT_AMBULATORY_CARE_PROVIDER_SITE_OTHER): Payer: 59

## 2021-04-25 ENCOUNTER — Encounter: Payer: Self-pay | Admitting: Podiatry

## 2021-04-25 DIAGNOSIS — M7732 Calcaneal spur, left foot: Secondary | ICD-10-CM | POA: Diagnosis not present

## 2021-04-25 DIAGNOSIS — M7662 Achilles tendinitis, left leg: Secondary | ICD-10-CM | POA: Diagnosis not present

## 2021-04-25 DIAGNOSIS — M7660 Achilles tendinitis, unspecified leg: Secondary | ICD-10-CM

## 2021-04-25 MED ORDER — TRIAMCINOLONE ACETONIDE 10 MG/ML IJ SUSP
10.0000 mg | Freq: Once | INTRAMUSCULAR | Status: AC
  Administered 2021-04-25: 10 mg

## 2021-04-25 NOTE — Telephone Encounter (Signed)
Updated labs checked 04/16/21 at PCP - A1c 6.6%, TC 239, HDL 47, TG 185, VLDL 34, LDL not showing but anticipated to be ~153. LDL goal is < 70 due to nonobstructive CAD. Intolerant to atorvastatin and rosuvastatin 5mg  daily.   Last spoke to pt in August, she wanted to take only turmeric and essential oils for her cholesterol despite discussion that neither of those supplements lower cholesterol. Her LDL has actually increased from 120 to 153 after starting them. She is also now diabetic.  Again recommend addition of PCSK9i. Left message for pt to discuss.

## 2021-04-28 NOTE — Progress Notes (Signed)
Subjective:   Patient ID: Meagan Reyes, female   DOB: 65 y.o.   MRN: [id number]   HPI Patient is concerned because she has had a lot of pain in the back of her left heel for around a month and she said at the time she felt a pop and its been sharp aching type pain.  She is moderately obese which is certainly complicating factor with no long-term history of condition and does not smoke and would like to be more active   Review of Systems  All other systems reviewed and are negative.      Objective:  Physical Exam Vitals and nursing note reviewed.  Constitutional:      Appearance: She is well-developed.  Pulmonary:     Effort: Pulmonary effort is normal.  Musculoskeletal:        General: Normal range of motion.  Skin:    General: Skin is warm.  Neurological:     Mental Status: She is alert.    Neurovascular status found to be intact muscle strength was found to be adequate range of motion adequate.  I tested the Achilles extensively did not see indications of a tear of the tendon and the pain is on the medial side of the Achilles with the possibility that this could be the plantaris that popped.  It is quite inflamed in this area with no lateral or central involvement currently and patient has good digital perfusion well oriented x3     Assessment:  Inflammatory Achilles tendinitis left that is affecting what appears to be the medial insertion with possibility for plantaris tear     Plan:  H&P x-rays reviewed condition discussed at great length.  I do think a very careful injection which I made her aware does increase chances for rupture with immobilization is the best way to handle this and she wants this done and today I did sterile prep and injected the medial side of the Achilles 3 mg dexamethasone Kenalog 5 mg Xylocaine and applied air fracture walker for her to wear for the next 3 to 4 weeks.  Reappoint 1 month or earlier if any issues were to occur  X-rays indicate that  there is posterior spur formation no indications of fracture or spur dislocation

## 2021-05-02 ENCOUNTER — Other Ambulatory Visit: Payer: Self-pay

## 2021-05-02 ENCOUNTER — Ambulatory Visit (HOSPITAL_COMMUNITY)
Admission: RE | Admit: 2021-05-02 | Discharge: 2021-05-02 | Disposition: A | Discharge status: Home / Self Care | Payer: 59 | Source: Ambulatory Visit | Attending: Internal Medicine | Admitting: Internal Medicine

## 2021-05-02 DIAGNOSIS — S1984XD Other specified injuries of thyroid gland, subsequent encounter: Secondary | ICD-10-CM | POA: Insufficient documentation

## 2021-05-02 NOTE — Telephone Encounter (Signed)
Called pt again. She states her PCP is checking her labs again within the next month. She reports he advised her to give the turmeric and essential oil longer to work. This does not sound likely to me. I again advised her that neither turmeric nor essential oils lower cholesterol, and that her cholesterol has actually increased since starting them. Again emphasized that starting Repatha would be the most effective option to bring her LDL to goal.  She states she prefers to continue on current meds until her labs are checked again but that she would be agreeable to start Repatha if her cholesterol is still elevated at that time. Will plan to call pt again to encourage her to start Repatha once follow labs are back as her LDL will still be elevated.

## 2021-05-13 ENCOUNTER — Other Ambulatory Visit: Payer: Self-pay

## 2021-05-13 ENCOUNTER — Encounter: Payer: Self-pay | Admitting: Internal Medicine

## 2021-05-13 ENCOUNTER — Ambulatory Visit (INDEPENDENT_AMBULATORY_CARE_PROVIDER_SITE_OTHER): Payer: 59 | Admitting: Internal Medicine

## 2021-05-13 VITALS — BP 126/70 | HR 62 | Ht 68.0 in | Wt 308.0 lb

## 2021-05-13 DIAGNOSIS — Z13220 Encounter for screening for lipoid disorders: Secondary | ICD-10-CM | POA: Diagnosis not present

## 2021-05-13 DIAGNOSIS — I10 Essential (primary) hypertension: Secondary | ICD-10-CM

## 2021-05-13 NOTE — Patient Instructions (Signed)
Medication Instructions:  Your physician recommends that you continue on your current medications as directed. Please refer to the Current Medication list given to you today.  *If you need a refill on your cardiac medications before your next appointment, please call your pharmacy*   Lab Work: Your physician recommends that you return for lab work in: Today   If you have labs (blood work) drawn today and your tests are completely normal, you will receive your results only by: MyChart Message (if you have MyChart) OR A paper copy in the mail If you have any lab test that is abnormal or we need to change your treatment, we will call you to review the results.   Testing/Procedures: NONE    Follow-Up: At [facility], you and your health needs are our priority.  As part of our continuing mission to provide you with exceptional heart care, we have created designated Provider Care Teams.  These Care Teams include your primary Cardiologist (physician) and Advanced Practice Providers (APPs -  Physician Assistants and Nurse Practitioners) who all work together to provide you with the care you need, when you need it.  We recommend signing up for the patient portal called "MyChart".  Sign up information is provided on this After Visit Summary.  MyChart is used to connect with patients for Virtual Visits (Telemedicine).  Patients are able to view lab/test results, encounter notes, upcoming appointments, etc.  Non-urgent messages can be sent to your provider as well.   To learn more about what you can do with MyChart, go to [email or web address].    Your next appointment:    May / June   The format for your next appointment:   In Person  Provider:   Dorris Carnes, MD   Other Instructions Thank you for choosing [facility]!

## 2021-05-13 NOTE — Progress Notes (Signed)
Cardiology Office Note   Date:  05/13/2021   ID:  LAUREN MODISETTE, DOB 04-24-1956, MRN [id number]  PCP:  Redmond School, MD  Cardiologist:   Dorris Carnes, MD   Pt presents for f/u eval of SOB   History of Present Illness: Meagan Reyes is a 65 y.o. female with a history of chest  pain  I saw her for th efirst time in Sept 2020   After that visit an echo showed normal systolic function, mild diastolic dysfunciotn   Coronary CT angiogram showed mild plaquing   Ca score was 19   The pt also had PFT which were normal   Lasixin increased for LE edema   The pt was admitted to [facility] on 11/20/20   for SOB    No CP  EMS called  HR 40s  Sat 98% RA   A CT was done and was  negative for PE or lung probems  Meds adjusted   She as taken off of dilt and labetaolol given low HR  Echo showed normal LVEF    She as given IV hydration for AKI     The pt was seen in lipid clinic by Sundra Aland   Placed on Crestor   THis was stopped due to achiness  I sas the pt in June2022  She was still SOB   L Leg swollen   BP was sill elevated,    I recomm adding Entresto to regimen  Since seen she has been doing better   Breathing is better   BP is better   She is doing more walking        I saw the pt in July 2022    Switched diuretic to spironolactone    Since seen she says she feels GREAT  No SOB   No CP   No palpitations    Walking even with boot on and not having problems   Current Meds  Medication Sig   amLODipine (NORVASC) 10 MG tablet Take 10 mg by mouth daily.   aspirin 81 MG chewable tablet Chew 81 mg by mouth at bedtime.    blood glucose meter kit and supplies Dispense based on patient and insurance preference. Use up to four times daily as directed. (FOR ICD-10 E10.9, E11.9).   Cholecalciferol (VITAMIN D3) 25 MCG (1000 UT) CAPS Take 2,000 Units by mouth 2 (two) times daily.   Flaxseed, Linseed, (FLAX SEED OIL) 1000 MG CAPS Take 1 capsule by mouth daily.   furosemide (LASIX) 40 MG tablet Take 80 mg by  mouth. Take 80 mg am. 40 mg pm   meloxicam (MOBIC) 15 MG tablet Take 1 tablet by mouth once daily   pantoprazole (PROTONIX) 40 MG tablet Take 40 mg by mouth daily.   potassium chloride SA (KLOR-CON M20) 20 MEQ tablet Take 1 tablet (20 mEq total) by mouth daily.   sacubitril-valsartan (ENTRESTO) 49-51 MG Take 1 tablet by mouth 2 (two) times daily.   Specialty Vitamins Products (MAGNESIUM, AMINO ACID CHELATE,) 133 MG tablet Take 1 tablet by mouth 2 (two) times daily.   spironolactone (ALDACTONE) 25 MG tablet Take 1 tablet (25 mg total) by mouth daily.   SUPER B COMPLEX/C PO Take 1 tablet by mouth daily.   vitamin C (ASCORBIC ACID) 500 MG tablet Take 500 mg by mouth daily.   Zinc 15 MG CAPS Take 15 mg by mouth daily.   Current Facility-Administered Medications for the 05/13/21 encounter (Office Visit) with  Fay Records, MD  Medication   nortriptyline (PAMELOR) capsule 10 mg     Allergies:   Atorvastatin, Doxycycline, Rosuvastatin, and Neurontin [gabapentin]   Past Medical History:  Diagnosis Date   Chronic diastolic heart failure (HCC)    GERD (gastroesophageal reflux disease)    Hyperlipemia    Hypertension     Past Surgical History:  Procedure Laterality Date   ABDOMINAL HYSTERECTOMY     BACK SURGERY     HEEL SPUR EXCISION     KNEE ARTHROSCOPY WITH LATERAL MENISECTOMY Right 10/18/2015   Procedure: RIGHT KNEE ARTHROSCOPY WITH LATERAL MENISECTOMY;  Surgeon: [name], MD;  Location: [facility];  Service: Orthopedics;  Laterality: Right;   TONSILLECTOMY       Social History:  The patient  reports that she quit smoking about 45 years ago. Her smoking use included cigarettes. She started smoking about 46 years ago. She has a 0.03 pack-year smoking history. She has never used smokeless tobacco. She reports that she does not drink alcohol and does not use drugs.   Family History:  The patient's family history includes Asthma in her mother; Atrial fibrillation in her mother; COPD  in her mother; Cancer in her father; Hyperlipidemia in her brother, brother, and brother; Leukemia in her mother; Stroke in her mother.    ROS:  Please see the history of present illness. All other systems are reviewed and  Negative to the above problem except as noted.    PHYSICAL EXAM: VS:  BP 126/70   Pulse 62   Ht _0  (1.727 m)   Wt (!) 308 lb (139.7 kg)   SpO2 99%   BMI 46.83 kg/m   GEN: Morbidly obese 65 yo in no acute distress  HEENT: normal  Neck: no JVD, no carotid bruits Cardiac: RRR; no murmurs  Trivial LE edema  Respiratory:  clear to auscultation bilaterally,  GI: soft, nontender MS: no deformity Moving all extremities   Skin: warm and dry  Mild erythema in legs  Neuro:  Grossly intact Psych: euthymic mood, full affect   EKG:  EKG isnot  ordered today.   Lipid Panel    Component Value Date/Time   CHOL 227 (H) 11/20/2020 0517   CHOL 217 (H) 08/17/2019 0838   TRIG 413 (H) 11/20/2020 0517   HDL 37 (L) 11/20/2020 0517   HDL 55 08/17/2019 0838   CHOLHDL 6.1 11/20/2020 0517   VLDL UNABLE TO CALCULATE IF TRIGLYCERIDE OVER 400 mg/dL 11/20/2020 0517   LDLCALC UNABLE TO CALCULATE IF TRIGLYCERIDE OVER 400 mg/dL 11/20/2020 0517   LDLCALC 130 (H) 08/17/2019 0838   LDLDIRECT 120.2 (H) 11/20/2020 0517      Wt Readings from Last 3 Encounters:  05/13/21 (!) 308 lb (139.7 kg)  02/11/21 (!) 307 lb 9.6 oz (139.5 kg)  01/10/21 (!) 304 lb 9.6 oz (138.2 kg)      ASSESSMENT AND PLAN:  1  HTN  BP readings are great  120s     2  DOE   Pt is doing much better   No SOB    3  LIpids   Will get lipomed, Lp(a), ApoB   Review CT  3  morbid obesity   Congratulated her on Wt loss   She is eating 2x per day   Watching carbs   Keep it up      Signed, Dorris Carnes, MD  05/13/2021 9:59 AM    [facility] [place],  [place]  [address] Phone: [phone]; Fax: [phone]

## 2021-05-14 LAB — LIPID PANEL
Chol/HDL Ratio: 4.2 ratio (ref 0.0–4.4)
Cholesterol, Total: 194 mg/dL (ref 100–199)
HDL: 46 mg/dL (ref >39)
LDL Chol Calc (NIH): 118 mg/dL — ABNORMAL HIGH (ref 0–99)
Triglycerides: 171 mg/dL — ABNORMAL HIGH (ref 0–149)
VLDL Cholesterol Cal: 30 mg/dL (ref 5–40)

## 2021-05-14 LAB — HEMOGLOBIN A1C
Est. average glucose Bld gHb Est-mCnc: 131 mg/dL
Hgb A1c MFr Bld: 6.2 % — ABNORMAL HIGH (ref 4.8–5.6)

## 2021-05-14 LAB — APOLIPOPROTEIN B: Apolipoprotein B: 101 mg/dL — ABNORMAL HIGH (ref <90)

## 2021-05-23 ENCOUNTER — Encounter: Payer: Self-pay | Admitting: Podiatry

## 2021-05-23 ENCOUNTER — Other Ambulatory Visit: Payer: Self-pay

## 2021-05-23 ENCOUNTER — Ambulatory Visit (INDEPENDENT_AMBULATORY_CARE_PROVIDER_SITE_OTHER): Payer: 59 | Admitting: Podiatry

## 2021-05-23 DIAGNOSIS — M7732 Calcaneal spur, left foot: Secondary | ICD-10-CM | POA: Diagnosis not present

## 2021-05-23 DIAGNOSIS — M7660 Achilles tendinitis, unspecified leg: Secondary | ICD-10-CM | POA: Diagnosis not present

## 2021-05-23 MED ORDER — PREDNISONE 10 MG PO TABS: ORAL_TABLET | ORAL | 0 refills | Status: DC

## 2021-05-23 NOTE — Progress Notes (Signed)
Subjective:   Patient ID: Meagan Reyes, female   DOB: 65 y.o.   MRN: [id number]   HPI Patient presents stating I am improved but I still get discomfort and I need to wear the boot pretty much all the time   ROS      Objective:  Physical Exam  Neurovascular status intact with discomfort in the Achilles tendon mostly on the medial side left that improved but is still present with deep palpation with moderate obesity is complicating factor     Assessment:  Achilles tendinitis with possibility for plantaris tendon tear left which is still creating discomfort with boot usage which has been beneficial     Plan:  H&P I did discuss and evaluate muscle and found to have excellent strength and I have recommended continued boot usage for 3-4 more weeks ice therapy elevated heels and gradual increase in activity levels with reduction of the boot.  Did discuss possibility for physical therapy or other treatments depending on response and we did discuss the Achilles tendon and long-term possibilities for surgery that I would like to avoid at all costs if possible

## 2021-05-30 NOTE — Telephone Encounter (Signed)
Called pt to discuss updated labs, lipids checked 05/13/21 - TC 194, TG 171, HDL 46, LDL 118. LDL goal is < 70. Discussed Repatha as pt refuses statin rechallenge, pt does not wish to start at this time and reports she will think about it. Advised her to call clinic if she changes her mind.

## 2021-08-18 NOTE — Progress Notes (Signed)
Cardiology Office Note:    Date:  08/19/2021   ID:  RIDA LOUDIN, DOB [birth year], MRN [id number]  PCP:  Redmond School, MD   [facility] Providers Cardiologist:  Dorris Carnes, MD     Referring MD: Redmond School, MD   Chief Complaint: increased dyspnea and lower extremity edema  History of Present Illness:    Meagan Reyes is a very pleasant 66 y.o. female with a hx of chronic HFpEF, HTN, sinus bradycardia, nonobstructive CAD, aortic atherosclerosis, hyperlipidemia, GERD, and arthritis.  She established care with our group and was initially evaluated by Dr. Harrington Challenger on 04/04/19 for evaluation of chest pain. Coronary CT was ordered which revealed a calcium score of 19. She has had multiple ED and office visits  for dyspnea.   She had previous admission to [facility] on 11/20/20 for SOB. Her echo revealed LVEF normal LVEF at 65-70%, no rwma, no LVH, indeterminate diastolic parameters, normal RV, mild dilatation of ascending aorta at 40 mm. CT was negative for PE. Prior echo in 24/26 revealed diastolic dysfunction.She had been started on Entresto and then later spironolactone . At most recent office visit on 05/13/21 with Dr. Harrington Challenger, she reported improvement in her symptoms and was advised to follow-up in 6 months.  Today, she is here after calling for an appointment with concerns for increased dyspnea and lower extremity edema.  She states she had 10 pound weight gain increased leg swelling, dyspnea on exertion in less than 1 week.  States her weight was down to 297 pounds due to intentional weight loss efforts which was the lowest it had been prior to this weight gain. Recently traveled 2 hours each way for a conference at which she was sedentary. Noticed increased bilateral leg swelling with LLE > RLE.  She reports it took many years to figure out that lisinopril was causing increased shortness of breath but she has been off it for some time now.  She was feeling well until approximately 1 week ago  when she began to notice increased shortness of breath with activity, weight increase and worsening leg edema. She denies chest pain, fatigue, palpitations, melena, hematuria, hemoptysis, diaphoresis, weakness, presyncope, syncope, orthopnea, and PND. Wears her husband's cpap on occasion but has never completed a sleep study. Elevated lipids for many years but has been hesitant to take prescription medication.  She works as an Designer, multimedia and has previously only been taking turmeric for elevated LDL. She had a minor viral illness around Christmas, but no lingering symptoms.   Past Medical History:  Diagnosis Date   Chronic diastolic heart failure (HCC)    GERD (gastroesophageal reflux disease)    Hyperlipemia    Hypertension     Past Surgical History:  Procedure Laterality Date   ABDOMINAL HYSTERECTOMY     BACK SURGERY     HEEL SPUR EXCISION     KNEE ARTHROSCOPY WITH LATERAL MENISECTOMY Right 10/18/2015   Procedure: RIGHT KNEE ARTHROSCOPY WITH LATERAL MENISECTOMY;  Surgeon: [name], MD;  Location: [facility];  Service: Orthopedics;  Laterality: Right;   TONSILLECTOMY      Current Medications: Current Meds  Medication Sig   amLODipine (NORVASC) 5 MG tablet Take 1 tablet (5 mg total) by mouth daily.   aspirin 81 MG chewable tablet Chew 81 mg by mouth at bedtime.    Cholecalciferol (VITAMIN D3) 25 MCG (1000 UT) CAPS Take 2,000 Units by mouth 2 (two) times daily.   Flaxseed, Linseed, (FLAX SEED OIL) 1000  MG CAPS Take 1 capsule by mouth daily.   furosemide (LASIX) 40 MG tablet Take 80 mg by mouth. Take 80 mg am. 40 mg pm   meloxicam (MOBIC) 15 MG tablet Take 1 tablet by mouth once daily   pantoprazole (PROTONIX) 40 MG tablet Take 40 mg by mouth daily.   potassium chloride SA (KLOR-CON M20) 20 MEQ tablet Take 1 tablet (20 mEq total) by mouth daily.   sacubitril-valsartan (ENTRESTO) 49-51 MG Take 1 tablet by mouth 2 (two) times daily.   Specialty Vitamins Products  (MAGNESIUM, AMINO ACID CHELATE,) 133 MG tablet Take 1 tablet by mouth 2 (two) times daily.   SUPER B COMPLEX/C PO Take 1 tablet by mouth daily.   vitamin C (ASCORBIC ACID) 500 MG tablet Take 500 mg by mouth daily.   Zinc 15 MG CAPS Take 15 mg by mouth daily.   [DISCONTINUED] amLODipine (NORVASC) 10 MG tablet Take 10 mg by mouth daily.   [DISCONTINUED] amLODipine (NORVASC) 5 MG tablet Take 1 tablet (5 mg total) by mouth daily.   Current Facility-Administered Medications for the 08/19/21 encounter (Office Visit) with Ann Maki, Lanice Schwab, NP  Medication   nortriptyline (PAMELOR) capsule 10 mg     Allergies:   Atorvastatin, Doxycycline, Rosuvastatin, and Neurontin [gabapentin]   Social History   Socioeconomic History   Marital status: Married    Spouse name: Not on file   Number of children: Not on file   Years of education: Not on file   Highest education level: Not on file  Occupational History   Not on file  Tobacco Use   Smoking status: Former    Packs/day: 0.10    Years: 0.25    Pack years: 0.03    Types: Cigarettes    Start date: 07/1974    Quit date: 06/1975    Years since quitting: 46.1   Smokeless tobacco: Never   Tobacco comments:    a couple months when 33 then stopped  Vaping Use   Vaping Use: Never used  Substance and Sexual Activity   Alcohol use: No   Drug use: No   Sexual activity: Yes    Birth control/protection: Surgical  Other Topics Concern   Not on file  Social History Narrative   Not on file   Social Determinants of Health   Financial Resource Strain: Not on file  Food Insecurity: Not on file  Transportation Needs: Not on file  Physical Activity: Not on file  Stress: Not on file  Social Connections: Not on file     Family History: The patient's family history includes Asthma in her mother; Atrial fibrillation in her mother; COPD in her mother; Cancer in her father; Hyperlipidemia in her brother, brother, and brother; Leukemia in her mother;  Stroke in her mother. There is no history of Colon cancer or Colon polyps.  ROS:   Please see the history of present illness.    +leg swelling, LLE > RLE +dyspnea + weight gain All other systems reviewed and are negative.  Labs/Other Studies Reviewed:    The following studies were reviewed today:  Echo 11/19/20  Left Ventricle: Left ventricular ejection fraction, by estimation, is 65  to 70%. The left ventricle has normal function. The left ventricle has no  regional wall motion abnormalities. The left ventricular internal cavity  size was normal in size. There is  no left ventricular hypertrophy. Left ventricular diastolic parameters are indeterminate.  Right Ventricle: The right ventricular size is normal. No increase in  right ventricular wall thickness. Right ventricular systolic function is  normal.  Left Atrium: Left atrial size was normal in size.  Right Atrium: Right atrial size was normal in size.  Pericardium: There is no evidence of pericardial effusion.  Mitral Valve: The mitral valve is normal in structure. No evidence of  mitral valve regurgitation. No evidence of mitral valve stenosis.  Tricuspid Valve: The tricuspid valve is normal in structure. Tricuspid  valve regurgitation is not demonstrated. No evidence of tricuspid  stenosis.  Aortic Valve: The aortic valve is tricuspid. Aortic valve regurgitation is  not visualized. No aortic stenosis is present. Aortic valve mean gradient  measures 4.7 mmHg. Aortic valve peak gradient measures 9.9 mmHg. Aortic  valve area, by VTI measures 2.44  cm.  Pulmonic Valve: The pulmonic valve was not well visualized. Pulmonic valve  regurgitation is not visualized. No evidence of pulmonic stenosis.  Aorta: The aortic root is normal in size and structure and aortic  dilatation noted. There is mild dilatation of the ascending aorta,  measuring 40 mm.  Pulmonary Artery: Indeterminant PASP, inadequate TR jet.  IAS/Shunts: No atrial  level shunt detected by color flow Doppler.   Coronary CT 11/20   IMPRESSION: 1. Coronary artery calcium score 1.9 WU. This places the patient in the 60th percentile for age and gender, suggesting intermediate risk for future cardiac events.   2.  Nonobstructive mild CAD.  Echo 10/20  Left Ventricle: Left ventricular ejection fraction, by visual estimation,  is 60 to 65%. The left ventricle has normal function. There is no left  ventricular hypertrophy. Normal left ventricular size. Spectral Doppler  shows Left ventricular diastolic  Doppler parameters are consistent with impaired relaxation pattern of LV  diastolic filling.  Right Ventricle: The right ventricular size is normal. No increase in  right ventricular wall thickness. Global RV systolic function is has  normal systolic function. The tricuspid regurgitant velocity is 2.39 m/s,  and with an assumed right atrial pressure   of 3 mmHg, the estimated right ventricular systolic pressure is normal at  25.8 mmHg.  Left Atrium: Left atrial size was normal in size.  Right Atrium: Right atrial size was normal in size  Pericardium: There is no evidence of pericardial effusion.  Mitral Valve: The mitral valve is grossly normal. Trace mitral valve  regurgitation.  Tricuspid Valve: The tricuspid valve is abnormal. Tricuspid valve  regurgitation is trivial by color flow Doppler.  Aortic Valve: The aortic valve is tricuspid. Aortic valve regurgitation  was not visualized by color flow Doppler.  Pulmonic Valve: The pulmonic valve was grossly normal. Pulmonic valve  regurgitation is trivial by color flow Doppler.  Aorta: The aortic root is normal in size and structure.  Venous: The inferior vena cava is normal in size with greater than 50%  respiratory variability, suggesting right atrial pressure of 3 mmHg.  IAS/Shunts: No atrial level shunt detected by color flow Doppler.     Recent Labs: 11/19/2020: ALT 35; B Natriuretic Peptide  31.0; Hemoglobin 12.7; Platelets 237; TSH 1.346 11/20/2020: Magnesium 2.0 01/22/2021: NT-Pro BNP 92 02/22/2021: BUN 24; Creatinine, Ser 0.86; Potassium 4.0; Sodium 141  Recent Lipid Panel    Component Value Date/Time   CHOL 194 05/13/2021 1045   TRIG 171 (H) 05/13/2021 1045   HDL 46 05/13/2021 1045   CHOLHDL 4.2 05/13/2021 1045   CHOLHDL 6.1 11/20/2020 0517   VLDL UNABLE TO CALCULATE IF TRIGLYCERIDE OVER 400 mg/dL 11/20/2020 0517   LDLCALC 118 (H) 05/13/2021 1045  LDLDIRECT 120.2 (H) 11/20/2020 0517     Risk Assessment/Calculations:      Physical Exam:    VS:  BP 110/70    Pulse 63    Ht 5\' 8"  (1.727 m)    Wt (!) 305 lb (138.3 kg)    SpO2 95%    BMI 46.38 kg/m     Wt Readings from Last 3 Encounters:  08/19/21 (!) 305 lb (138.3 kg)  05/13/21 (!) 308 lb (139.7 kg)  02/11/21 (!) 307 lb 9.6 oz (139.5 kg)     GEN:  Well nourished, well developed in no acute distress HEENT: Normal NECK: No JVD; No carotid bruits CARDIAC: RRR, no murmurs, rubs, gallops RESPIRATORY:  Clear to auscultation without rales, wheezing or rhonchi  ABDOMEN: Soft, non-tender, non-distended MUSCULOSKELETAL:  2+ pitting edema bilateral lower extremities, LLE > RL. No deformity. 2+ pedal pulses, equal bilaterally SKIN: Warm and dry NEUROLOGIC:  Alert and oriented x 3 PSYCHIATRIC:  Normal affect   EKG:  EKG is ordered today.  The ekg ordered today demonstrates normal sinus rhythm at 63 bpm, LAD, no ST/T wave abnormality  Diagnoses:    1. Lower extremity edema   2. Dyspnea, unspecified type   3. Medication management   4. Chronic diastolic heart failure ([place])   5. Essential hypertension   6. Aortic atherosclerosis ([place])   7. Hyperlipidemia LDL goal <70   8. Obesity, Class III, BMI 40-49.9 (morbid obesity) (HCC)    Assessment and Plan:     Dyspnea on Exertion/Chronic HFpEF: She reports a 1 week history of increased dyspnea on exertion. Weight up 10 lbs in 1 week despite recent efforts to improve diet.  No chest pain, orthopnea, PND. LVEF 65 to 70%, indeterminate diastolic parameters, no rwma.  Will repeat echo in the setting of unexplained DOE to evaluate heart and valvular function. Continue Entresto, spironolactone, furosemide.   Lower extremity edema: One week history of increased leg swelling, left greater than right. Recent travel in conference in which she was more sedentary than usual.  We will get a venous ultrasound of her lower extremity to rule out DVT.  Blood pressure is well-controlled today, so we will reduce the dose of amlodipine to 5 mg daily to see if this improves edema.  Encouraged good leg elevation. Continue furosemide.   Essential hypertension: Blood pressure is stable today. As noted above, will reduce amlodipine dose due to leg edema.  Encouraged her to monitor blood pressure at home and notify us if SBP is consistently greater than 140 mmHg.  Continue Entresto, spironolactone, Lasix, amlodipine.   Aortic atherosclerosis/Hyperlipidemia LDL goal < 70: LDL 118 on 05/13/21.  She has been taking turmeric for hyperlipidemia.  In the setting of aortic atherosclerosis seen on CT and elevated LDL , she was previously referred to lipid clinic by Dr. Harrington Challenger and advised to start [place] but she has been hesitant.  Encouraged her that additional therapy may be needed and asked if we can recheck lipid panel for consideration of starting Repatha if LDL remains elevated.  She is agreeable to plan and will come in fasting tomorrow for lipid panel. Continue aspirin.   Snoring: She admits to snoring and has used her husband's CPAP on occasion. I offered her referral for home sleep test but she politely declines.   Obesity: Recent intentional weight loss resulted in weight less than 300 pounds for the first time in a long time.  She is back up to 307 lbs despite dietary changes.  Encouraged her to continue to work on weight loss and increasing physical activity.      Disposition: 4 month f/u with  Dr. Harrington Challenger   Medication Adjustments/Labs and Tests Ordered: Current medicines are reviewed at length with the patient today.  Concerns regarding medicines are outlined above.  Orders Placed This Encounter  Procedures   Lipid Profile   EKG 12-Lead   ECHOCARDIOGRAM COMPLETE   VAS [place] LOWER EXTREMITY VENOUS (DVT)   Meds ordered this encounter  Medications   DISCONTD: amLODipine (NORVASC) 5 MG tablet    Sig: Take 1 tablet (5 mg total) by mouth daily.    Dispense:  180 tablet    Refill:  3   amLODipine (NORVASC) 5 MG tablet    Sig: Take 1 tablet (5 mg total) by mouth daily.    Dispense:  90 tablet    Refill:  3    Patient Instructions  Medication Instructions:  Your physician has recommended you make the following change in your medication:   Change: Please reduce your Amlodipine to 5mg  daily   *If you need a refill on your cardiac medications before your next appointment, please call your pharmacy*   Lab Work: None ordered today   Testing/Procedures: Your physician has requested that you have an echocardiogram. Echocardiography is a painless test that uses sound waves to create images of your heart. It provides your doctor with information about the size and shape of your heart and how well your hearts chambers and valves are working. This procedure takes approximately one hour. There are no restrictions for this procedure. [place] has requested that you have a lower venous duplex. This test is an ultrasound of the veins in the legs  It looks at venous blood flow that carries blood from the heart to the legs. Allow one hour for a Lower Venous exam. There are no restrictions or special instructions.   Follow-Up: At [facility], you and your health needs are our priority.  As part of our continuing mission to provide you with exceptional heart care, we have created designated Provider Care Teams.  These Care Teams include your primary  Cardiologist (physician) and Advanced Practice Providers (APPs -  Physician Assistants and Nurse Practitioners) who all work together to provide you with the care you need, when you need it.  We recommend signing up for the patient portal called "MyChart".  Sign up information is provided on this After Visit Summary.  MyChart is used to connect with patients for Virtual Visits (Telemedicine).  Patients are able to view lab/test results, encounter notes, upcoming appointments, etc.  Non-urgent messages can be sent to your provider as well.   To learn more about what you can do with MyChart, go to [email or web address].    Your next appointment:   Follow up as scheduled    Other Instructions:   Please call our office if your blood pressure is staying higher than 140    Signed, Meagan Life, NP  08/19/2021 12:53 PM    [place]

## 2021-08-19 ENCOUNTER — Encounter (HOSPITAL_BASED_OUTPATIENT_CLINIC_OR_DEPARTMENT_OTHER): Payer: Self-pay

## 2021-08-19 ENCOUNTER — Ambulatory Visit (HOSPITAL_BASED_OUTPATIENT_CLINIC_OR_DEPARTMENT_OTHER): Payer: 59 | Admitting: Nurse Practitioner

## 2021-08-19 ENCOUNTER — Other Ambulatory Visit: Payer: Self-pay

## 2021-08-19 ENCOUNTER — Ambulatory Visit (INDEPENDENT_AMBULATORY_CARE_PROVIDER_SITE_OTHER): Payer: 59

## 2021-08-19 ENCOUNTER — Encounter (HOSPITAL_BASED_OUTPATIENT_CLINIC_OR_DEPARTMENT_OTHER): Payer: Self-pay | Admitting: Nurse Practitioner

## 2021-08-19 VITALS — BP 110/70 | HR 63 | Ht 68.0 in | Wt 305.0 lb

## 2021-08-19 DIAGNOSIS — I5032 Chronic diastolic (congestive) heart failure: Secondary | ICD-10-CM | POA: Diagnosis not present

## 2021-08-19 DIAGNOSIS — R0600 Dyspnea, unspecified: Secondary | ICD-10-CM

## 2021-08-19 DIAGNOSIS — R600 Localized edema: Secondary | ICD-10-CM

## 2021-08-19 DIAGNOSIS — Z79899 Other long term (current) drug therapy: Secondary | ICD-10-CM | POA: Diagnosis not present

## 2021-08-19 DIAGNOSIS — E785 Hyperlipidemia, unspecified: Secondary | ICD-10-CM

## 2021-08-19 DIAGNOSIS — I700 Atherosclerosis of aorta: Secondary | ICD-10-CM

## 2021-08-19 DIAGNOSIS — I10 Essential (primary) hypertension: Secondary | ICD-10-CM

## 2021-08-19 MED ORDER — AMLODIPINE BESYLATE 5 MG PO TABS: 5.0000 mg | ORAL_TABLET | Freq: Every day | ORAL | 3 refills | Status: DC

## 2021-08-19 NOTE — Patient Instructions (Signed)
Medication Instructions:  Your physician has recommended you make the following change in your medication:   Change: Please reduce your Amlodipine to 5mg  daily   *If you need a refill on your cardiac medications before your next appointment, please call your pharmacy*   Lab Work: None ordered today   Testing/Procedures: Your physician has requested that you have an echocardiogram. Echocardiography is a painless test that uses sound waves to create images of your heart. It provides your doctor with information about the size and shape of your heart and how well your hearts chambers and valves are working. This procedure takes approximately one hour. There are no restrictions for this procedure. [place] has requested that you have a lower venous duplex. This test is an ultrasound of the veins in the legs  It looks at venous blood flow that carries blood from the heart to the legs. Allow one hour for a Lower Venous exam. There are no restrictions or special instructions.   Follow-Up: At [facility], you and your health needs are our priority.  As part of our continuing mission to provide you with exceptional heart care, we have created designated Provider Care Teams.  These Care Teams include your primary Cardiologist (physician) and Advanced Practice Providers (APPs -  Physician Assistants and Nurse Practitioners) who all work together to provide you with the care you need, when you need it.  We recommend signing up for the patient portal called "MyChart".  Sign up information is provided on this After Visit Summary.  MyChart is used to connect with patients for Virtual Visits (Telemedicine).  Patients are able to view lab/test results, encounter notes, upcoming appointments, etc.  Non-urgent messages can be sent to your provider as well.   To learn more about what you can do with MyChart, go to [email or web address].    Your next appointment:    Follow up as scheduled    Other Instructions:   Please call our office if your blood pressure is staying higher than 140

## 2021-08-20 ENCOUNTER — Ambulatory Visit (INDEPENDENT_AMBULATORY_CARE_PROVIDER_SITE_OTHER): Payer: 59

## 2021-08-20 DIAGNOSIS — R0600 Dyspnea, unspecified: Secondary | ICD-10-CM

## 2021-08-20 LAB — ECHOCARDIOGRAM COMPLETE
AR max vel: 3.61 cm2
AV Area VTI: 3.1 cm2
AV Area mean vel: 3.31 cm2
AV Mean grad: 4 mmHg
AV Peak grad: 9.2 mmHg
Ao pk vel: 1.52 m/s
Area-P 1/2: 3.06 cm2
Calc EF: 63.5 %
S' Lateral: 2.54 cm
Single Plane A2C EF: 65.7 %
Single Plane A4C EF: 62.1 %

## 2021-08-20 LAB — HEMOGLOBIN A1C
Est. average glucose Bld gHb Est-mCnc: 140 mg/dL
Hgb A1c MFr Bld: 6.5 % — ABNORMAL HIGH (ref 4.8–5.6)

## 2021-08-21 ENCOUNTER — Telehealth (HOSPITAL_BASED_OUTPATIENT_CLINIC_OR_DEPARTMENT_OTHER): Payer: Self-pay

## 2021-08-21 DIAGNOSIS — R600 Localized edema: Secondary | ICD-10-CM

## 2021-08-21 LAB — LIPID PANEL
Chol/HDL Ratio: 5.3 ratio — ABNORMAL HIGH (ref 0.0–4.4)
Cholesterol, Total: 222 mg/dL — ABNORMAL HIGH (ref 100–199)
HDL: 42 mg/dL (ref >39)
LDL Chol Calc (NIH): 138 mg/dL — ABNORMAL HIGH (ref 0–99)
Triglycerides: 235 mg/dL — ABNORMAL HIGH (ref 0–149)
VLDL Cholesterol Cal: 42 mg/dL — ABNORMAL HIGH (ref 5–40)

## 2021-08-21 NOTE — Addendum Note (Signed)
Addended by: Gerald Stabs on: 08/21/2021 04:17 PM   Modules accepted: Orders

## 2021-08-21 NOTE — Telephone Encounter (Addendum)
Attempted to call results to patient, no answer [id number]     ----- Message from Emmaline Life, NP sent at 08/20/2021  5:50 PM EST ----- Pumping function is normal with LVEF 55-60%. She has no regional wall motion abnormalities to suggest further ischemia evaluation is needed. Grade 2 diastolic dysfunction likely contributing to lower extremity edema and shortness of breath. Would like for her to try increased Lasix 80 mg twice daily for 3 consistent days if she has not already been doing so. Call us to let us know if this helps. Will need bmet in 1 week. If she has already been on this dose, I would like to switch her to torsemide. May take up to one week for decrease in amlodipine to show improvement in leg swelling.

## 2021-08-21 NOTE — Telephone Encounter (Signed)
Pt is returning call.  

## 2021-08-21 NOTE — Telephone Encounter (Signed)
Results called to patient who endorses understanding and is willing to try Lasix 80mg  BID for 3 days. RN to follow up in 4 days to see how patient is feeling. Patient is agreeable to having lab work done next week! No refills needed. Labs ordered and placed in the mail!         ----- Message from Emmaline Life, NP sent at 08/20/2021  5:50 PM EST ----- Pumping function is normal with LVEF 55-60%. She has no regional wall motion abnormalities to suggest further ischemia evaluation is needed. Grade 2 diastolic dysfunction likely contributing to lower extremity edema and shortness of breath. Would like for her to try increased Lasix 80 mg twice daily for 3 consistent days if she has not already been doing so. Call us to let us know if this helps. Will need bmet in 1 week. If she has already been on this dose, I would like to switch her to torsemide. May take up to one week for decrease in amlodipine to show improvement in leg swelling.

## 2021-08-22 ENCOUNTER — Telehealth: Payer: Self-pay | Admitting: Pharmacist

## 2021-08-22 NOTE — Telephone Encounter (Signed)
Pt is previously intolerant to rosuvastatin 5mg  and atorvastatin. Have previously discussed starting Repatha twice with pt. She preferred to take turmeric and essential oils for her cholesterol despite my discussion with her that neither product lowers cholesterol. Her follow up cholesterol remained elevated. I called pt to review most recent results. She still is not agreeable to starting Repatha and prefers to think about it. Have discussed Repatha with pt 3x now, advised her to call clinic if she changes her mind. Also advised her that her A1c has consistently been in the diabetic range on a few recent lab checks (she previously thought these were lab errors). She was advised to follow up with her PCP about this.

## 2021-08-26 ENCOUNTER — Encounter (HOSPITAL_BASED_OUTPATIENT_CLINIC_OR_DEPARTMENT_OTHER): Payer: Self-pay

## 2021-09-07 LAB — BASIC METABOLIC PANEL
BUN/Creatinine Ratio: 28 (ref 12–28)
BUN: 21 mg/dL (ref 8–27)
CO2: 30 mmol/L — ABNORMAL HIGH (ref 20–29)
Calcium: 10 mg/dL (ref 8.7–10.3)
Chloride: 100 mmol/L (ref 96–106)
Creatinine, Ser: 0.76 mg/dL (ref 0.57–1.00)
Glucose: 89 mg/dL (ref 70–99)
Potassium: 4.6 mmol/L (ref 3.5–5.2)
Sodium: 142 mmol/L (ref 134–144)
eGFR: 87 mL/min/{1.73_m2} (ref >59)

## 2021-09-17 ENCOUNTER — Ambulatory Visit: Payer: 59 | Admitting: Gastroenterology

## 2021-09-23 ENCOUNTER — Ambulatory Visit (INDEPENDENT_AMBULATORY_CARE_PROVIDER_SITE_OTHER): Payer: Self-pay | Admitting: *Deleted

## 2021-09-23 ENCOUNTER — Other Ambulatory Visit: Payer: Self-pay

## 2021-09-23 VITALS — Ht 68.0 in | Wt 305.0 lb

## 2021-09-23 DIAGNOSIS — Z1211 Encounter for screening for malignant neoplasm of colon: Secondary | ICD-10-CM

## 2021-09-23 NOTE — Progress Notes (Signed)
Gastroenterology Pre-Procedure Review ? ?Request Date: 09/23/2021 ?Requesting Physician: Dr. Gerarda Fraction, no previous TCS ? ?PATIENT REVIEW QUESTIONS: The patient responded to the following health history questions as indicated:   ? ?1. Diabetes Melitis: no ?2. Joint replacements in the past 12 months: no ?3. Major health problems in the past 3 months: no ?4. Has an artificial valve or MVP: no ?5. Has a defibrillator: no ?6. Has been advised in past to take antibiotics in advance of a procedure like teeth cleaning: no ?7. Family history of colon cancer: no ?8. Alcohol Use: no ?9. Illicit drug Use: no ?10. History of sleep apnea: no ?11. History of coronary artery or other vascular stents placed within the last 12 months: no ?12. History of any prior anesthesia complications: no ?13. Body mass index is 46.38 kg/m?. ?   ?MEDICATIONS & ALLERGIES:    ?Patient reports the following regarding taking any blood thinners:   ?Plavix? no ?Aspirin? Yes, 81 mg daily ?Coumadin? no ?Brilinta? no ?Xarelto? no ?Eliquis? no ?Pradaxa? no ?Savaysa? no ?Effient? no ? ?Patient confirms/reports the following medications:  ?Current Outpatient Medications  ?Medication Sig Dispense Refill  ? amLODipine (NORVASC) 5 MG tablet Take 1 tablet (5 mg total) by mouth daily. 90 tablet 3  ? aspirin 81 MG chewable tablet Chew 81 mg by mouth at bedtime.     ? Cholecalciferol (VITAMIN D3) 25 MCG (1000 UT) CAPS Take 2,000 Units by mouth daily at 6 (six) AM.    ? Flaxseed, Linseed, (FLAX SEED OIL) 1000 MG CAPS Take 1 capsule by mouth daily.    ? furosemide (LASIX) 40 MG tablet Take 40 mg by mouth. Take 80 mg am daily. 80 mg pm if swelling.    ? meloxicam (MOBIC) 15 MG tablet Take 1 tablet by mouth once daily 90 tablet 0  ? pantoprazole (PROTONIX) 40 MG tablet Take 40 mg by mouth daily.    ? potassium chloride SA (KLOR-CON M20) 20 MEQ tablet Take 1 tablet (20 mEq total) by mouth daily. 90 tablet 3  ? sacubitril-valsartan (ENTRESTO) 49-51 MG Take 1 tablet by mouth 2  (two) times daily. 60 tablet 11  ? Specialty Vitamins Products (MAGNESIUM, AMINO ACID CHELATE,) 133 MG tablet Take 1 tablet by mouth 2 (two) times daily.    ? spironolactone (ALDACTONE) 25 MG tablet Take 1 tablet (25 mg total) by mouth daily. 90 tablet 3  ? UNABLE TO FIND 2 (two) times daily. Med Name: Multivitamin Doterra    ? vitamin C (ASCORBIC ACID) 500 MG tablet Take 500 mg by mouth daily.    ? Zinc 15 MG CAPS Take 15 mg by mouth daily.    ? ?Current Facility-Administered Medications  ?Medication Dose Route Frequency Provider Last Rate Last Admin  ? nortriptyline (PAMELOR) capsule 10 mg  10 mg Oral QHS [name], MD      ? ? ?Patient confirms/reports the following allergies:  ?Allergies  ?Allergen Reactions  ? Atorvastatin Other (See Comments)  ?  Myalgia; pt reports this occurred in past, was prescribed by different doctor.  ? Doxycycline   ?  Shortness or breath one time another time had symptoms of heart attack  ? Rosuvastatin   ?  Joint pain on '5mg'$  daily  ? Neurontin [Gabapentin] Rash  ? ? ?No orders of the defined types were placed in this encounter. ? ? ?AUTHORIZATION INFORMATION ?Primary Insurance: [employer],  ID #: [id number],  Group #: Individual OnEx [id number] ?Pre-Cert / Josem Kaufmann required:  ?Pre-Cert /  Auth #:  ? ?SCHEDULE INFORMATION: ?Procedure has been scheduled as follows:  ?Date: , Time:   ?Location: [facility] with Dr. Abbey Chatters ? ?This Gastroenterology Pre-Precedure Review Form is being routed to the following provider(s): Neil Crouch, PA-C ?  ?

## 2021-09-25 NOTE — Progress Notes (Signed)
Due to cardiac history she needs an OV. ?

## 2021-09-26 NOTE — Progress Notes (Signed)
Spoke to pt. Informed her ov needed to arrange TCS due to cardiac hx.  Scheduled ov for 11/06/2021 at 3:30 with Dr. Abbey Chatters. ?

## 2021-11-06 ENCOUNTER — Other Ambulatory Visit: Payer: Self-pay

## 2021-11-06 ENCOUNTER — Ambulatory Visit: Payer: 59 | Admitting: Internal Medicine

## 2021-11-06 ENCOUNTER — Encounter: Payer: Self-pay | Admitting: Internal Medicine

## 2021-11-06 VITALS — BP 138/72 | HR 85 | Temp 97.7°F | Ht 68.0 in | Wt 303.2 lb

## 2021-11-06 DIAGNOSIS — Z1211 Encounter for screening for malignant neoplasm of colon: Secondary | ICD-10-CM | POA: Diagnosis not present

## 2021-11-06 DIAGNOSIS — K219 Gastro-esophageal reflux disease without esophagitis: Secondary | ICD-10-CM | POA: Diagnosis not present

## 2021-11-06 MED ORDER — PEG 3350-KCL-NA BICARB-NACL 420 G PO SOLR: 4000.0000 mL | ORAL | 0 refills | Status: DC

## 2021-11-06 NOTE — Progress Notes (Signed)
Primary Care Physician:  Redmond School, MD Primary Gastroenterologist:  Dr. Abbey Chatters  Chief Complaint  Patient presents with   Colonoscopy    HPI:   Meagan Reyes is a 66 y.o. female who presents to clinic today to discuss colonoscopy.  No previous colonoscopy.  No family history of colorectal malignancy.  No abdominal pain.  No melena hematochezia.  No unintentional weight loss.  Does have chronic GERD.  She takes digestion for this which contains peppermint oil which keeps her symptoms relatively well controlled.  No dysphagia odynophagia.  No epigastric or chest pain.  Past Medical History:  Diagnosis Date   Chronic diastolic heart failure (HCC)    GERD (gastroesophageal reflux disease)    Hyperlipemia    Hypertension     Past Surgical History:  Procedure Laterality Date   ABDOMINAL HYSTERECTOMY     BACK SURGERY     HEEL SPUR EXCISION     KNEE ARTHROSCOPY WITH LATERAL MENISECTOMY Right 10/18/2015   Procedure: RIGHT KNEE ARTHROSCOPY WITH LATERAL MENISECTOMY;  Surgeon: [name], MD;  Location: [facility];  Service: Orthopedics;  Laterality: Right;   TONSILLECTOMY      Current Outpatient Medications  Medication Sig Dispense Refill   amLODipine (NORVASC) 5 MG tablet Take 1 tablet (5 mg total) by mouth daily. 90 tablet 3   aspirin 81 MG chewable tablet Chew 81 mg by mouth at bedtime.      Cholecalciferol (VITAMIN D3) 25 MCG (1000 UT) CAPS Take 2,000 Units by mouth daily at 6 (six) AM.     Flaxseed, Linseed, (FLAX SEED OIL) 1000 MG CAPS Take 1 capsule by mouth daily.     furosemide (LASIX) 40 MG tablet Take 80 mg by mouth. Take 80 mg am daily. 80 mg pm if swelling.     meloxicam (MOBIC) 15 MG tablet Take 1 tablet by mouth once daily 90 tablet 0   potassium chloride SA (KLOR-CON M20) 20 MEQ tablet Take 1 tablet (20 mEq total) by mouth daily. 90 tablet 3   sacubitril-valsartan (ENTRESTO) 49-51 MG Take 1 tablet by mouth 2 (two) times daily. 60 tablet 11   Specialty  Vitamins Products (MAGNESIUM, AMINO ACID CHELATE,) 133 MG tablet Take 1 tablet by mouth 2 (two) times daily.     spironolactone (ALDACTONE) 25 MG tablet Take 1 tablet (25 mg total) by mouth daily. 90 tablet 3   UNABLE TO FIND 2 (two) times daily. Med Name: Multivitamin Doterra     vitamin C (ASCORBIC ACID) 500 MG tablet Take 500 mg by mouth daily.     Zinc 15 MG CAPS Take 15 mg by mouth daily.     pantoprazole (PROTONIX) 40 MG tablet Take 40 mg by mouth daily. (Patient not taking: Reported on 11/06/2021)     No current facility-administered medications for this visit.    Allergies as of 11/06/2021 - Review Complete 11/06/2021  Allergen Reaction Noted   Atorvastatin Other (See Comments) 04/04/2019   Doxycycline  05/03/2019   Rosuvastatin  02/27/2021   Neurontin [gabapentin] Rash 07/12/2013    Family History  Problem Relation Age of Onset   Stroke Mother    Asthma Mother    Atrial fibrillation Mother    Leukemia Mother    COPD Mother    Cancer Father        lung   Hyperlipidemia Brother    Hyperlipidemia Brother    Hyperlipidemia Brother    Colon cancer Neg Hx    Colon polyps  Neg Hx     Social History   Socioeconomic History   Marital status: Married    Spouse name: Not on file   Number of children: Not on file   Years of education: Not on file   Highest education level: Not on file  Occupational History   Not on file  Tobacco Use   Smoking status: Former    Packs/day: 0.10    Years: 0.25    Pack years: 0.03    Types: Cigarettes    Start date: 07/1974    Quit date: 06/1975    Years since quitting: 46.4   Smokeless tobacco: Never   Tobacco comments:    a couple months when 62 then stopped  Vaping Use   Vaping Use: Never used  Substance and Sexual Activity   Alcohol use: No   Drug use: No   Sexual activity: Yes    Birth control/protection: Surgical  Other Topics Concern   Not on file  Social History Narrative   Not on file   Social Determinants of Health    Financial Resource Strain: Not on file  Food Insecurity: Not on file  Transportation Needs: Not on file  Physical Activity: Not on file  Stress: Not on file  Social Connections: Not on file  Intimate Partner Violence: Not on file    Subjective: Review of Systems  Constitutional:  Negative for chills and fever.  HENT:  Negative for congestion and hearing loss.   Eyes:  Negative for blurred vision and double vision.  Respiratory:  Negative for cough and shortness of breath.   Cardiovascular:  Negative for chest pain and palpitations.  Gastrointestinal:  Negative for abdominal pain, blood in stool, constipation, diarrhea, heartburn, melena and vomiting.  Genitourinary:  Negative for dysuria and urgency.  Musculoskeletal:  Negative for joint pain and myalgias.  Skin:  Negative for itching and rash.  Neurological:  Negative for dizziness and headaches.  Psychiatric/Behavioral:  Negative for depression. The patient is not nervous/anxious.       Objective: BP 138/72 (BP Location: Right Arm, Patient Position: Sitting, Cuff Size: Large)   Pulse 85   Temp 97.7 F (36.5 C) (Temporal)   Ht '5\' 8"'$  (1.727 m)   Wt (!) 303 lb 3.2 oz (137.5 kg)   SpO2 97%   BMI 46.10 kg/m  Physical Exam Constitutional:      Appearance: Normal appearance. She is obese.  HENT:     Head: Normocephalic and atraumatic.  Eyes:     Extraocular Movements: Extraocular movements intact.     Conjunctiva/sclera: Conjunctivae normal.  Cardiovascular:     Rate and Rhythm: Normal rate and regular rhythm.  Pulmonary:     Effort: Pulmonary effort is normal.     Breath sounds: Normal breath sounds.  Abdominal:     General: Bowel sounds are normal.     Palpations: Abdomen is soft.  Musculoskeletal:        General: No swelling. Normal range of motion.     Cervical back: Normal range of motion and neck supple.  Skin:    General: Skin is warm and dry.     Coloration: Skin is not jaundiced.  Neurological:      General: No focal deficit present.     Mental Status: She is alert and oriented to person, place, and time.  Psychiatric:        Mood and Affect: Mood normal.        Behavior: Behavior normal.  Assessment: *GERD-mild, controlled on Digestzen peppermint oil *Colon cancer screening  Plan: Will schedule for screening colonoscopy.The risks including infection, bleed, or perforation as well as benefits, limitations, alternatives and imponderables have been reviewed with the patient. Questions have been answered. All parties agreeable.  GERD well controlled on peppermint oil. No alarm symptoms to warrant EGD today.   Further recommendations to follow    11/06/2021 3:47 PM   Disclaimer: This note was dictated with voice recognition software. Similar sounding words can inadvertently be transcribed and may not be corrected upon review.

## 2021-11-06 NOTE — Patient Instructions (Addendum)
We will schedule you for colonoscopy for colon cancer screening purposes. ? ?Further recommendations to follow. ? ?Continue on Digestzen for your chronic acid reflux. ? ?It was very nice meeting you today. ? ?Dr. Abbey Chatters ? ?Lifestyle and home remedies TO MANAGE REFLUX/HEARTBURN ? ?  ?You may eliminate or reduce the frequency of heartburn by making the following lifestyle changes: ?  ?Control your weight. Being overweight is a major risk factor for heartburn and GERD. Excess pounds put pressure on your abdomen, pushing up your stomach and causing acid to back up into your esophagus.  ?  ?Eat smaller meals. 4 TO 6 MEALS A DAY. This reduces pressure on the lower esophageal sphincter, helping to prevent the valve from opening and acid from washing back into your esophagus. ?  ?  ?Loosen your belt. Clothes that fit tightly around your waist put pressure on your abdomen and the lower esophageal sphincter. ?  ?  ?Eliminate heartburn triggers. Everyone has specific triggers. Common triggers such as fatty or fried foods, spicy food, tomato sauce, carbonated beverages, alcohol, chocolate, mint, garlic, onion, caffeine and nicotine may make heartburn worse.  ?  ?Avoid stooping or bending. Tying your shoes is OK. Bending over for longer periods to weed your garden isn't, especially soon after eating.  ?  ?Don't lie down after a meal. Wait at least three to four hours after eating before going to bed, and don't lie down right after eating.  ? ? ?At [facility] we value your feedback. You may receive a survey about your visit today. Please share your experience as we strive to create trusting relationships with our patients to provide genuine, compassionate, quality care. ? ?We appreciate your understanding and patience as we review any laboratory studies, imaging, and other diagnostic tests that are ordered as we care for you. Our office policy is 5 business days for review of these results, and any emergent or  urgent results are addressed in a timely manner for your best interest. If you do not hear from our office in 1 week, please contact us.  ? ?We also encourage the use of MyChart, which contains your medical information for your review as well. If you are not enrolled in this feature, an access code is on this after visit summary for your convenience. Thank you for allowing [place] to be involved in your care. ? ?It was great to see you today!  I hope you have a great rest of your Spring! ? ? ? ?Meagan Reyes. Abbey Chatters, D.O. ?Gastroenterology and Hepatology ?[facility] ? ?

## 2021-12-03 NOTE — Patient Instructions (Signed)
? ? ? ? ? ? Meagan Reyes ? 12/03/2021  ?  ? '@PREFPERIOPPHARMACY'$ @ ? ? Your procedure is scheduled on  12/09/2021. ? ? Report to Meagan Reyes at  0730  A.M. ? ? Call this number if you have problems the morning of surgery: ? [phone] ? ? Remember: ? Follow the diet and prep instructions given to you by the office. ?  ? Take these medicines the morning of surgery with A SIP OF WATER   ? ?                      amlodipine, mobic, entresto. ?  ? ? Do not wear jewelry, make-up or nail polish. ? Do not wear lotions, powders, or perfumes, or deodorant. ? Do not shave 48 hours prior to surgery.  Men may shave face and neck. ? Do not bring valuables to the hospital. ? Meagan Reyes is not responsible for any belongings or valuables. ? ?Contacts, dentures or bridgework may not be worn into surgery.  Leave your suitcase in the car.  After surgery it may be brought to your room. ? ?For patients admitted to the hospital, discharge time will be determined by your treatment team. ? ?Patients discharged the day of surgery will not be allowed to drive home and must have someone with them for 24 hours.  ? ? ?Special instructions:   DO NOT smoke tobacco or vape for 24 hours before your procedure. ? ?Please read over the following fact sheets that you were given. ?Anesthesia Post-op Instructions and Care and Recovery After Surgery ?  ? ? ? Colonoscopy, Adult, Care After ?The following information offers guidance on how to care for yourself after your procedure. Your health care provider may also give you more specific instructions. If you have problems or questions, contact your health care provider. ?What can I expect after the procedure? ?After the procedure, it is common to have: ?A small amount of blood in your stool for 24 hours after the procedure. ?Some gas. ?Mild cramping or bloating of your abdomen. ?Follow these instructions at home: ?Eating and drinking ? ?Drink enough fluid to keep your urine pale yellow. ?Follow  instructions from your health care provider about eating or drinking restrictions. ?Resume your normal diet as told by your health care provider. Avoid heavy or fried foods that are hard to digest. ?Activity ?Rest as told by your health care provider. ?Avoid sitting for a long time without moving. Get up to take short walks every 1-2 hours. This is important to improve blood flow and breathing. Ask for help if you feel weak or unsteady. ?Return to your normal activities as told by your health care provider. Ask your health care provider what activities are safe for you. ?Managing cramping and bloating ? ?Try walking around when you have cramps or feel bloated. ?If directed, apply heat to your abdomen as told by your health care provider. Use the heat source that your health care provider recommends, such as a moist heat pack or a heating pad. ?Place a towel between your skin and the heat source. ?Leave the heat on for 20-30 minutes. ?Remove the heat if your skin turns bright red. This is especially important if you are unable to feel pain, heat, or cold. You have a greater risk of getting burned. ?General instructions ?If you were given a sedative during the procedure, it can affect you for several hours. Do not drive or operate machinery until your health  care provider says that it is safe. ?For the first 24 hours after the procedure: ?Do not sign important documents. ?Do not drink alcohol. ?Do your regular daily activities at a slower pace than normal. ?Eat soft foods that are easy to digest. ?Take over-the-counter and prescription medicines only as told by your health care provider. ?Keep all follow-up visits. This is important. ?Contact a health care provider if: ?You have blood in your stool 2-3 days after the procedure. ?Get help right away if: ?You have more than a small spotting of blood in your stool. ?You have large blood clots in your stool. ?You have swelling of your abdomen. ?You have nausea or  vomiting. ?You have a fever. ?You have increasing pain in your abdomen that is not relieved with medicine. ?These symptoms may be an emergency. Get help right away. Call 911. ?Do not wait to see if the symptoms will go away. ?Do not drive yourself to the hospital. ?Summary ?After the procedure, it is common to have a small amount of blood in your stool. You may also have mild cramping and bloating of your abdomen. ?If you were given a sedative during the procedure, it can affect you for several hours. Do not drive or operate machinery until your health care provider says that it is safe. ?Get help right away if you have a lot of blood in your stool, nausea or vomiting, a fever, or increased pain in your abdomen. ?This information is not intended to replace advice given to you by your health care provider. Make sure you discuss any questions you have with your health care provider. ?Document Revised: 02/27/2021 Document Reviewed: 02/27/2021 ?Elsevier Patient Education ? [place]. ?Monitored Anesthesia Care, Care After ?This sheet gives you information about how to care for yourself after your procedure. Your health care provider may also give you more specific instructions. If you have problems or questions, contact your health care provider. ?What can I expect after the procedure? ?After the procedure, it is common to have: ?Tiredness. ?Forgetfulness about what happened after the procedure. ?Impaired judgment for important decisions. ?Nausea or vomiting. ?Some difficulty with balance. ?Follow these instructions at home: ?For the time period you were told by your health care provider: ? ?  ? ?Rest as needed. ?Do not participate in activities where you could fall or become injured. ?Do not drive or use machinery. ?Do not drink alcohol. ?Do not take sleeping pills or medicines that cause drowsiness. ?Do not make important decisions or sign legal documents. ?Do not take care of children on your own. ?Eating and  drinking ?Follow the diet that is recommended by your health care provider. ?Drink enough fluid to keep your urine pale yellow. ?If you vomit: ?Drink water, juice, or soup when you can drink without vomiting. ?Make sure you have little or no nausea before eating solid foods. ?General instructions ?Have a responsible adult stay with you for the time you are told. It is important to have someone help care for you until you are awake and alert. ?Take over-the-counter and prescription medicines only as told by your health care provider. ?If you have sleep apnea, surgery and certain medicines can increase your risk for breathing problems. Follow instructions from your health care provider about wearing your sleep device: ?Anytime you are sleeping, including during daytime naps. ?While taking prescription pain medicines, sleeping medicines, or medicines that make you drowsy. ?Avoid smoking. ?Keep all follow-up visits as told by your health care provider. This is important. ?  Contact a health care provider if: ?You keep feeling nauseous or you keep vomiting. ?You feel light-headed. ?You are still sleepy or having trouble with balance after 24 hours. ?You develop a rash. ?You have a fever. ?You have redness or swelling around the IV site. ?Get help right away if: ?You have trouble breathing. ?You have new-onset confusion at home. ?Summary ?For several hours after your procedure, you may feel tired. You may also be forgetful and have poor judgment. ?Have a responsible adult stay with you for the time you are told. It is important to have someone help care for you until you are awake and alert. ?Rest as told. Do not drive or operate machinery. Do not drink alcohol or take sleeping pills. ?Get help right away if you have trouble breathing, or if you suddenly become confused. ?This information is not intended to replace advice given to you by your health care provider. Make sure you discuss any questions you have with your  health care provider. ?Document Revised: 06/11/2021 Document Reviewed: 06/09/2019 ?Elsevier Patient Education ? [place]. ? ?

## 2021-12-04 ENCOUNTER — Encounter (HOSPITAL_COMMUNITY)
Admission: RE | Admit: 2021-12-04 | Discharge: 2021-12-04 | Disposition: A | Discharge status: Home / Self Care | Payer: 59 | Source: Ambulatory Visit | Attending: Internal Medicine | Admitting: Internal Medicine

## 2021-12-04 ENCOUNTER — Encounter (HOSPITAL_COMMUNITY): Payer: Self-pay

## 2021-12-04 VITALS — BP 136/83 | HR 63 | Temp 97.8°F | Resp 18 | Ht 68.0 in | Wt 297.0 lb

## 2021-12-04 DIAGNOSIS — Z79899 Other long term (current) drug therapy: Secondary | ICD-10-CM | POA: Diagnosis not present

## 2021-12-04 DIAGNOSIS — Z01812 Encounter for preprocedural laboratory examination: Secondary | ICD-10-CM | POA: Insufficient documentation

## 2021-12-04 HISTORY — DX: Other complications of anesthesia, initial encounter: T88.59XA

## 2021-12-04 LAB — BASIC METABOLIC PANEL
Anion gap: 10 (ref 5–15)
BUN: 26 mg/dL — ABNORMAL HIGH (ref 8–23)
CO2: 30 mmol/L (ref 22–32)
Calcium: 9.5 mg/dL (ref 8.9–10.3)
Chloride: 100 mmol/L (ref 98–111)
Creatinine, Ser: 0.93 mg/dL (ref 0.44–1.00)
GFR, Estimated: >60 mL/min (ref >60)
Glucose, Bld: 95 mg/dL (ref 70–99)
Potassium: 3.9 mmol/L (ref 3.5–5.1)
Sodium: 140 mmol/L (ref 135–145)

## 2021-12-04 NOTE — Progress Notes (Signed)
?   12/04/21 0955  ?OBSTRUCTIVE SLEEP APNEA  ?Have you ever been diagnosed with sleep apnea through a sleep study? No  ?Do you snore loudly (loud enough to be heard through closed doors)?  1  ?Do you often feel tired, fatigued, or sleepy during the daytime (such as falling asleep during driving or talking to someone)? 0  ?Has anyone observed you stop breathing during your sleep? 1  ?Do you have, or are you being treated for high blood pressure? 1  ?BMI more than 35 kg/m2? 1  ?Age > 40 (1-yes) 1  ?Neck circumference greater than:Female 16 inches or larger, Female 17inches or larger? 0  ?Female Gender (Yes=1) 0  ?Obstructive Sleep Apnea Score 5  ?Score 5 or greater  Results sent to PCP  ? ? ?

## 2021-12-05 ENCOUNTER — Encounter: Payer: Self-pay | Admitting: Podiatry

## 2021-12-05 ENCOUNTER — Ambulatory Visit: Payer: 59 | Admitting: Podiatry

## 2021-12-05 DIAGNOSIS — M7662 Achilles tendinitis, left leg: Secondary | ICD-10-CM | POA: Diagnosis not present

## 2021-12-05 DIAGNOSIS — M7660 Achilles tendinitis, unspecified leg: Secondary | ICD-10-CM | POA: Diagnosis not present

## 2021-12-05 DIAGNOSIS — M7661 Achilles tendinitis, right leg: Secondary | ICD-10-CM | POA: Diagnosis not present

## 2021-12-06 NOTE — Progress Notes (Signed)
Subjective:   Patient ID: Meagan Reyes, female   DOB: 66 y.o.   MRN: [id number]   HPI Patient presents with a lot of discomfort in the left heel at the insertional point tendon calcaneus and states that its been getting worse gradually over time with obesity is complicating factor.  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  I explained the procedure risk and patient wants the procedure and is scheduled for shockwave therapy and will have 3 with a fourth 1 month later and also can work to reduce stress on the calf muscle

## 2021-12-09 ENCOUNTER — Ambulatory Visit (HOSPITAL_COMMUNITY)
Admission: RE | Admit: 2021-12-09 | Discharge: 2021-12-09 | Disposition: A | Discharge status: Home / Self Care | Payer: 59 | Attending: Internal Medicine | Admitting: Internal Medicine

## 2021-12-09 ENCOUNTER — Encounter (HOSPITAL_COMMUNITY): Payer: Self-pay

## 2021-12-09 ENCOUNTER — Ambulatory Visit (HOSPITAL_COMMUNITY): Payer: 59 | Admitting: Anesthesiology

## 2021-12-09 ENCOUNTER — Encounter (HOSPITAL_COMMUNITY)
Admission: RE | Disposition: A | Discharge status: Home / Self Care | Payer: Self-pay | Source: Home / Self Care | Attending: Internal Medicine

## 2021-12-09 ENCOUNTER — Ambulatory Visit (HOSPITAL_BASED_OUTPATIENT_CLINIC_OR_DEPARTMENT_OTHER): Payer: 59 | Admitting: Anesthesiology

## 2021-12-09 DIAGNOSIS — I110 Hypertensive heart disease with heart failure: Secondary | ICD-10-CM | POA: Diagnosis not present

## 2021-12-09 DIAGNOSIS — S83289A Other tear of lateral meniscus, current injury, unspecified knee, initial encounter: Secondary | ICD-10-CM

## 2021-12-09 DIAGNOSIS — K5730 Diverticulosis of large intestine without perforation or abscess without bleeding: Secondary | ICD-10-CM

## 2021-12-09 DIAGNOSIS — R739 Hyperglycemia, unspecified: Secondary | ICD-10-CM

## 2021-12-09 DIAGNOSIS — I5032 Chronic diastolic (congestive) heart failure: Secondary | ICD-10-CM | POA: Insufficient documentation

## 2021-12-09 DIAGNOSIS — Z6841 Body Mass Index (BMI) 40.0 and over, adult: Secondary | ICD-10-CM | POA: Diagnosis not present

## 2021-12-09 DIAGNOSIS — M7989 Other specified soft tissue disorders: Secondary | ICD-10-CM | POA: Diagnosis not present

## 2021-12-09 DIAGNOSIS — K648 Other hemorrhoids: Secondary | ICD-10-CM | POA: Insufficient documentation

## 2021-12-09 DIAGNOSIS — K635 Polyp of colon: Secondary | ICD-10-CM

## 2021-12-09 DIAGNOSIS — D123 Benign neoplasm of transverse colon: Secondary | ICD-10-CM | POA: Diagnosis not present

## 2021-12-09 DIAGNOSIS — R0609 Other forms of dyspnea: Secondary | ICD-10-CM | POA: Diagnosis not present

## 2021-12-09 DIAGNOSIS — M1990 Unspecified osteoarthritis, unspecified site: Secondary | ICD-10-CM | POA: Insufficient documentation

## 2021-12-09 DIAGNOSIS — D124 Benign neoplasm of descending colon: Secondary | ICD-10-CM | POA: Diagnosis not present

## 2021-12-09 DIAGNOSIS — K219 Gastro-esophageal reflux disease without esophagitis: Secondary | ICD-10-CM | POA: Insufficient documentation

## 2021-12-09 DIAGNOSIS — R0602 Shortness of breath: Secondary | ICD-10-CM | POA: Diagnosis not present

## 2021-12-09 DIAGNOSIS — E261 Secondary hyperaldosteronism: Secondary | ICD-10-CM

## 2021-12-09 DIAGNOSIS — M130 Polyarthritis, unspecified: Secondary | ICD-10-CM

## 2021-12-09 DIAGNOSIS — Z87891 Personal history of nicotine dependence: Secondary | ICD-10-CM | POA: Insufficient documentation

## 2021-12-09 DIAGNOSIS — G4730 Sleep apnea, unspecified: Secondary | ICD-10-CM | POA: Insufficient documentation

## 2021-12-09 DIAGNOSIS — M1711 Unilateral primary osteoarthritis, right knee: Secondary | ICD-10-CM

## 2021-12-09 DIAGNOSIS — R2240 Localized swelling, mass and lump, unspecified lower limb: Secondary | ICD-10-CM

## 2021-12-09 DIAGNOSIS — N179 Acute kidney failure, unspecified: Secondary | ICD-10-CM

## 2021-12-09 DIAGNOSIS — E270 Other adrenocortical overactivity: Secondary | ICD-10-CM

## 2021-12-09 DIAGNOSIS — Z1211 Encounter for screening for malignant neoplasm of colon: Secondary | ICD-10-CM | POA: Diagnosis present

## 2021-12-09 DIAGNOSIS — E876 Hypokalemia: Secondary | ICD-10-CM

## 2021-12-09 DIAGNOSIS — R001 Bradycardia, unspecified: Secondary | ICD-10-CM

## 2021-12-09 DIAGNOSIS — M2240 Chondromalacia patellae, unspecified knee: Secondary | ICD-10-CM

## 2021-12-09 DIAGNOSIS — I10 Essential (primary) hypertension: Secondary | ICD-10-CM

## 2021-12-09 DIAGNOSIS — E781 Pure hyperglyceridemia: Secondary | ICD-10-CM

## 2021-12-09 HISTORY — PX: POLYPECTOMY: SHX149

## 2021-12-09 HISTORY — PX: COLONOSCOPY WITH PROPOFOL: SHX5780

## 2021-12-09 SURGERY — COLONOSCOPY WITH PROPOFOL
Anesthesia: General

## 2021-12-09 MED ORDER — LIDOCAINE HCL (CARDIAC) PF 100 MG/5ML IV SOSY
PREFILLED_SYRINGE | INTRAVENOUS | Status: DC | PRN
Start: 2021-12-09 — End: 2021-12-09
  Administered 2021-12-09: 50 mg via INTRAVENOUS

## 2021-12-09 MED ORDER — LACTATED RINGERS IV SOLN: INTRAVENOUS | Status: DC

## 2021-12-09 MED ORDER — PROPOFOL 500 MG/50ML IV EMUL
INTRAVENOUS | Status: DC | PRN
  Administered 2021-12-09: 125 ug/kg/min via INTRAVENOUS

## 2021-12-09 MED ORDER — PROPOFOL 10 MG/ML IV BOLUS
INTRAVENOUS | Status: DC | PRN
  Administered 2021-12-09: 100 mg via INTRAVENOUS

## 2021-12-09 NOTE — Anesthesia Postprocedure Evaluation (Signed)
Anesthesia Post Note  Patient: Meagan Reyes  Procedure(s) Performed: COLONOSCOPY WITH PROPOFOL POLYPECTOMY INTESTINAL  Patient location during evaluation: Phase II Anesthesia Type: General Level of consciousness: awake and alert and oriented Pain management: pain level controlled Vital Signs Assessment: post-procedure vital signs reviewed and stable Respiratory status: spontaneous breathing, nonlabored ventilation and respiratory function stable Cardiovascular status: blood pressure returned to baseline and stable Postop Assessment: no apparent nausea or vomiting Anesthetic complications: no   No notable events documented.   Last Vitals:  Vitals:   12/09/21 0813 12/09/21 0928  BP: (!) 141/65 (!) 109/54  Pulse: 65 67  Resp: (!) 8 14  Temp: 36.7 C   SpO2: 100% 96%    Last Pain:  Vitals:   12/09/21 0928  TempSrc:   PainSc: 0-No pain                 Willett Lefeber C Vaeda Westall

## 2021-12-09 NOTE — Discharge Instructions (Addendum)
  Colonoscopy Discharge Instructions  Read the instructions outlined below and refer to this sheet in the next few weeks. These discharge instructions provide you with general information on caring for yourself after you leave the hospital. Your doctor may also give you specific instructions. While your treatment has been planned according to the most current medical practices available, unavoidable complications occasionally occur.   ACTIVITY You may resume your regular activity, but move at a slower pace for the next 24 hours.  Take frequent rest periods for the next 24 hours.  Walking will help get rid of the air and reduce the bloated feeling in your belly (abdomen).  No driving for 24 hours (because of the medicine (anesthesia) used during the test).   Do not sign any important legal documents or operate any machinery for 24 hours (because of the anesthesia used during the test).  NUTRITION Drink plenty of fluids.  You may resume your normal diet as instructed by your doctor.  Begin with a light meal and progress to your normal diet. Heavy or fried foods are harder to digest and may make you feel sick to your stomach (nauseated).  Avoid alcoholic beverages for 24 hours or as instructed.  MEDICATIONS You may resume your normal medications unless your doctor tells you otherwise.  WHAT YOU CAN EXPECT TODAY Some feelings of bloating in the abdomen.  Passage of more gas than usual.  Spotting of blood in your stool or on the toilet paper.  IF YOU HAD POLYPS REMOVED DURING THE COLONOSCOPY: No aspirin products for 7 days or as instructed.  No alcohol for 7 days or as instructed.  Eat a soft diet for the next 24 hours.  FINDING OUT THE RESULTS OF YOUR TEST Not all test results are available during your visit. If your test results are not back during the visit, make an appointment with your caregiver to find out the results. Do not assume everything is normal if you have not heard from your  caregiver or the medical facility. It is important for you to follow up on all of your test results.  SEEK IMMEDIATE MEDICAL ATTENTION IF: You have more than a spotting of blood in your stool.  Your belly is swollen (abdominal distention).  You are nauseated or vomiting.  You have a temperature over 101.  You have abdominal pain or discomfort that is severe or gets worse throughout the day.   Your colonoscopy revealed 3 polyp(s) which I removed successfully. One was >1 cm. Await pathology results, my office will contact you. I recommend repeating colonoscopy in 3 years for surveillance purposes.   You also have diverticulosis and internal hemorrhoids. I would recommend increasing fiber in your diet or adding OTC Benefiber/Metamucil. Be sure to drink at least 4 to 6 glasses of water daily. Follow-up with GI as needed.   I hope you have a great rest of your week!  Elon Alas. Abbey Chatters, D.O. Gastroenterology and Hepatology [facility]

## 2021-12-09 NOTE — H&P (Signed)
Primary Care Physician:  Redmond School, MD Primary Gastroenterologist:  Dr. Abbey Chatters  Pre-Procedure History & Physical: HPI:  Meagan Reyes is a 66 y.o. female is here for first ever colonoscopy for colon cancer screening purposes.  Patient denies any family history of colorectal cancer.  No melena or hematochezia.  No abdominal pain or unintentional weight loss.  No change in bowel habits.  Overall feels well from a GI standpoint.  Past Medical History:  Diagnosis Date   Chronic diastolic heart failure (HCC)    Complication of anesthesia    difficult to wake up after anesthesia   GERD (gastroesophageal reflux disease)    Hyperlipemia    Hypertension     Past Surgical History:  Procedure Laterality Date   ABDOMINAL HYSTERECTOMY     BACK SURGERY     HEEL SPUR EXCISION     KNEE ARTHROSCOPY WITH LATERAL MENISECTOMY Right 10/18/2015   Procedure: RIGHT KNEE ARTHROSCOPY WITH LATERAL MENISECTOMY;  Surgeon: [name], MD;  Location: [facility];  Service: Orthopedics;  Laterality: Right;   TONSILLECTOMY      Prior to Admission medications   Medication Sig Start Date End Date Taking? Authorizing Provider  amLODipine (NORVASC) 5 MG tablet Take 1 tablet (5 mg total) by mouth daily. 08/19/21 11/29/21 Yes Swinyer, Lanice Schwab, NP  aspirin 81 MG chewable tablet Chew 81 mg by mouth at bedtime.    Yes [provider]  Cholecalciferol (VITAMIN D3) 50 MCG (2000 UT) capsule Take 2,000 Units by mouth daily at 6 (six) AM.   Yes [provider]  Flaxseed, Linseed, (FLAX SEED OIL) 1000 MG CAPS Take 40 mg by mouth daily.   Yes [provider]  furosemide (LASIX) 40 MG tablet Take 80 mg by mouth See admin instructions. Take 80 mg by mouth in the morning, may take an additional 80 mg in the afternoon if needed.   Yes [provider]  meloxicam (MOBIC) 15 MG tablet Take 1 tablet by mouth once daily 08/28/20  Yes [name], MD  Multiple Vitamin (MULTIVITAMIN WITH  MINERALS) TABS tablet Take 2 tablets by mouth daily. Doterra Brand   Yes [provider]  polyethylene glycol-electrolytes (TRILYTE) 420 g solution Take 4,000 mLs by mouth as directed. 11/06/21  Yes Rocco Kerkhoff, Elon Alas, DO  potassium chloride SA (KLOR-CON M20) 20 MEQ tablet Take 1 tablet (20 mEq total) by mouth daily. 02/11/21 11/29/21 Yes Fay Records, MD  sacubitril-valsartan (ENTRESTO) 49-51 MG Take 1 tablet by mouth 2 (two) times daily. 01/28/21  Yes Fay Records, MD  Specialty Vitamins Products (MAGNESIUM, AMINO ACID CHELATE,) 133 MG tablet Take 1 tablet by mouth daily.   Yes [provider]  spironolactone (ALDACTONE) 25 MG tablet Take 1 tablet (25 mg total) by mouth daily. 02/11/21 11/29/21 Yes Fay Records, MD  vitamin C (ASCORBIC ACID) 500 MG tablet Take 500 mg by mouth daily.   Yes [provider]  Zinc 15 MG CAPS Take 15 mg by mouth daily.   Yes [provider]    Allergies as of 11/06/2021 - Review Complete 11/06/2021  Allergen Reaction Noted   Atorvastatin Other (See Comments) 04/04/2019   Doxycycline  05/03/2019   Rosuvastatin  02/27/2021   Neurontin [gabapentin] Rash 07/12/2013    Family History  Problem Relation Age of Onset   Stroke Mother    Asthma Mother    Atrial fibrillation Mother    Leukemia Mother    COPD Mother  Cancer Father        lung   Hyperlipidemia Brother    Hyperlipidemia Brother    Hyperlipidemia Brother    Colon cancer Neg Hx    Colon polyps Neg Hx     Social History   Socioeconomic History   Marital status: Married    Spouse name: Not on file   Number of children: Not on file   Years of education: Not on file   Highest education level: Not on file  Occupational History   Not on file  Tobacco Use   Smoking status: Former    Packs/day: 0.10    Years: 0.25    Pack years: 0.03    Types: Cigarettes    Start date: 07/1974    Quit date: 06/1975    Years since quitting: 46.5   Smokeless tobacco: Never    Tobacco comments:    a couple months when 15 then stopped  Vaping Use   Vaping Use: Never used  Substance and Sexual Activity   Alcohol use: No   Drug use: No   Sexual activity: Yes    Birth control/protection: Surgical  Other Topics Concern   Not on file  Social History Narrative   Not on file   Social Determinants of Health   Financial Resource Strain: Not on file  Food Insecurity: Not on file  Transportation Needs: Not on file  Physical Activity: Not on file  Stress: Not on file  Social Connections: Not on file  Intimate Partner Violence: Not on file    Review of Systems: See HPI, otherwise negative ROS  Physical Exam: Vital signs in last 24 hours: Temp:  [98 F (36.7 C)] 98 F (36.7 C) (05/22 0813) Pulse Rate:  [65] 65 (05/22 0813) Resp:  [8] 8 (05/22 0813) BP: (141)/(65) 141/65 (05/22 0813) SpO2:  [100 %] 100 % (05/22 0813) Weight:  [134.7 kg] 134.7 kg (05/22 0813)   General:   Alert,  Well-developed, well-nourished, pleasant and cooperative in NAD Head:  Normocephalic and atraumatic. Eyes:  Sclera clear, no icterus.   Conjunctiva pink. Ears:  Normal auditory acuity. Nose:  No deformity, discharge,  or lesions. Mouth:  No deformity or lesions, dentition normal. Neck:  Supple; no masses or thyromegaly. Lungs:  Clear throughout to auscultation.   No wheezes, crackles, or rhonchi. No acute distress. Heart:  Regular rate and rhythm; no murmurs, clicks, rubs,  or gallops. Abdomen:  Soft, nontender and nondistended. No masses, hepatosplenomegaly or hernias noted. Normal bowel sounds, without guarding, and without rebound.   Msk:  Symmetrical without gross deformities. Normal posture. Extremities:  Without clubbing or edema. Neurologic:  Alert and  oriented x4;  grossly normal neurologically. Skin:  Intact without significant lesions or rashes. Cervical Nodes:  No significant cervical adenopathy. Psych:  Alert and cooperative. Normal mood and  affect.  Impression/Plan: Meagan Reyes is here for a colonoscopy to be performed for colon cancer screening purposes.  The risks of the procedure including infection, bleed, or perforation as well as benefits, limitations, alternatives and imponderables have been reviewed with the patient. Questions have been answered. All parties agreeable.

## 2021-12-09 NOTE — Op Note (Signed)
[facility] Patient Name: Meagan Reyes Procedure Date: 12/09/2021 9:03 AM MRN: [id number] Date of Birth: Dec 08, 1955 Attending MD: Elon Alas. Abbey Chatters DO CSN: [id number] Age: 66 Admit Type: Outpatient Procedure:                Colonoscopy Indications:              Screening for colorectal malignant neoplasm Providers:                Elon Alas. Abbey Chatters, DO, Janeece Riggers, RN, [place]                            Page, Caprice Kluver, Thomas Hoff., Technician Referring MD:              Medicines:                See the Anesthesia note for documentation of the                            administered medications Complications:            No immediate complications. Estimated Blood Loss:     Estimated blood loss was minimal. Procedure:                Pre-Anesthesia Assessment:                           - The anesthesia plan was to use monitored                            anesthesia care (MAC).                           After obtaining informed consent, the colonoscope                            was passed under direct vision. Throughout the                            procedure, the patient's blood pressure, pulse, and                            oxygen saturations were monitored continuously. The                            [id number]-HQ190L ([id number]) scope was introduced through                            the anus and advanced to the the cecum, identified                            by appendiceal orifice and ileocecal valve. The                            colonoscopy was performed without difficulty. The                            patient  tolerated the procedure well. The quality                            of the bowel preparation was evaluated using the                            BBPS [facility] Bowel Preparation Scale) with scores                            of: Right Colon = 3, Transverse Colon = 3 and Left                            Colon = 3 (entire mucosa seen well with no residual                             staining, small fragments of stool or opaque                            liquid). The total BBPS score equals 9. Scope In: 9:12:37 AM Scope Out: 9:24:21 AM Scope Withdrawal Time: 0 hours 9 minutes 44 seconds  Total Procedure Duration: 0 hours 11 minutes 44 seconds  Findings:      Hemorrhoids were found on perianal exam.      Non-bleeding internal hemorrhoids were found during endoscopy.      A few small-mouthed diverticula were found in the sigmoid colon.      A 2 mm polyp was found in the transverse colon. The polyp was sessile.       The polyp was removed with a cold biopsy forceps. Resection and       retrieval were complete.      A 12 mm polyp was found in the transverse colon. The polyp was sessile.       The polyp was removed with a cold snare. Resection and retrieval were       complete.      A 5 mm polyp was found in the descending colon. The polyp was sessile.       The polyp was removed with a cold snare. Resection and retrieval were       complete.      The exam was otherwise without abnormality. Impression:               - Hemorrhoids found on perianal exam.                           - Non-bleeding internal hemorrhoids.                           - Diverticulosis in the sigmoid colon.                           - One 2 mm polyp in the transverse colon, removed                            with a cold biopsy forceps. Resected and retrieved.                           -  One 12 mm polyp in the transverse colon, removed                            with a cold snare. Resected and retrieved.                           - One 5 mm polyp in the descending colon, removed                            with a cold snare. Resected and retrieved.                           - The examination was otherwise normal. Moderate Sedation:      Per Anesthesia Care Recommendation:           - Patient has a contact number available for                            emergencies. The signs and  symptoms of potential                            delayed complications were discussed with the                            patient. Return to normal activities tomorrow.                            Written discharge instructions were provided to the                            patient.                           - Resume previous diet.                           - Continue present medications.                           - Await pathology results.                           - Repeat colonoscopy in 3 years for surveillance.                           - Return to GI clinic PRN. Procedure Code(s):        --- Professional ---                           [phone], Colonoscopy, flexible; with removal of                            tumor(s), polyp(s), or other lesion(s) by snare                            technique  [id number], 59, Colonoscopy, flexible; with biopsy,                            single or multiple Diagnosis Code(s):        --- Professional ---                           Z12.11, Encounter for screening for malignant                            neoplasm of colon                           K64.8, Other hemorrhoids                           K63.5, Polyp of colon                           K57.30, Diverticulosis of large intestine without                            perforation or abscess without bleeding CPT copyright 2019 American Medical Association. All rights reserved. The codes documented in this report are preliminary and upon coder review may  be revised to meet current compliance requirements. Elon Alas. Abbey Chatters, DO [place] Abbey Chatters, DO 12/09/2021 9:26:34 AM This report has been signed electronically. Number of Addenda: 0

## 2021-12-09 NOTE — Transfer of Care (Signed)
Immediate Anesthesia Transfer of Care Note  Patient: Meagan Reyes  Procedure(s) Performed: COLONOSCOPY WITH PROPOFOL POLYPECTOMY INTESTINAL  Patient Location: Short Stay  Anesthesia Type:General  Level of Consciousness: awake  Airway & Oxygen Therapy: Patient Spontanous Breathing  Post-op Assessment: Report given to RN and Post -op Vital signs reviewed and stable  Post vital signs: Reviewed and stable  Last Vitals:  Vitals Value Taken Time  BP    Temp    Pulse    Resp    SpO2      Last Pain:  Vitals:   12/09/21 0907  TempSrc:   PainSc: 0-No pain         Complications: No notable events documented.

## 2021-12-09 NOTE — Progress Notes (Unsigned)
Cardiology Office Note   Date:  12/09/2021   ID:  Meagan Reyes, DOB 24-Aug-1955, MRN [id number]  PCP:  Redmond School, MD  Cardiologist:   Dorris Carnes, MD   Pt presents for f/u eval of SOB   History of Present Illness: Meagan Reyes is a 66 y.o. female with a history of chest  pain  I saw her for th efirst time in Sept 2020   After that visit an echo showed normal systolic function, mild diastolic dysfunciotn   Coronary CT angiogram showed mild plaquing   Ca score was 19   The pt also had PFT which were normal   Lasixin increased for LE edema   The pt was admitted to [facility] on 11/20/20   for SOB    No CP  EMS called  HR 40s  Sat 98% RA   A CT was done and was  negative for PE or lung probems  Meds adjusted   She as taken off of dilt and labetaolol given low HR  Echo showed normal LVEF    She as given IV hydration for AKI     The pt was seen in lipid clinic by Sundra Aland   Placed on Crestor   THis was stopped due to achiness  I sas the pt in June2022  She was still SOB   L Leg swollen   BP was sill elevated,    I recomm adding Entresto to regimen  Since seen she has been doing better   Breathing is better   BP is better   She is doing more walking        I saw the pt in July 2022    Switched diuretic to spironolactone    Since seen she says she feels GREAT  No SOB   No CP   No palpitations    Walking even with boot on and not having problems   I saw the pt in Nov 2022  She was seen by Elwyn Reach since   No outpatient medications have been marked as taking for the 12/10/21 encounter (Appointment) with Fay Records, MD.     Allergies:   Atorvastatin, Doxycycline, Rosuvastatin, and Neurontin [gabapentin]   Past Medical History:  Diagnosis Date   Chronic diastolic heart failure ([place])    Complication of anesthesia    difficult to wake up after anesthesia   GERD (gastroesophageal reflux disease)    Hyperlipemia    Hypertension     Past Surgical History:  Procedure Laterality  Date   ABDOMINAL HYSTERECTOMY     BACK SURGERY     HEEL SPUR EXCISION     KNEE ARTHROSCOPY WITH LATERAL MENISECTOMY Right 10/18/2015   Procedure: RIGHT KNEE ARTHROSCOPY WITH LATERAL MENISECTOMY;  Surgeon: [name], MD;  Location: [facility];  Service: Orthopedics;  Laterality: Right;   TONSILLECTOMY       Social History:  The patient  reports that she quit smoking about 46 years ago. Her smoking use included cigarettes. She started smoking about 47 years ago. She has a 0.03 pack-year smoking history. She has never used smokeless tobacco. She reports that she does not drink alcohol and does not use drugs.   Family History:  The patient's family history includes Asthma in her mother; Atrial fibrillation in her mother; COPD in her mother; Cancer in her father; Hyperlipidemia in her brother, brother, and brother; Leukemia in her mother; Stroke in her mother.  ROS:  Please see the history of present illness. All other systems are reviewed and  Negative to the above problem except as noted.    PHYSICAL EXAM: VS:  There were no vitals taken for this visit.  GEN: Morbidly obese 66 yo in no acute distress  HEENT: normal  Neck: no JVD, no carotid bruits Cardiac: RRR; no murmurs  Trivial LE edema  Respiratory:  clear to auscultation bilaterally,  GI: soft, nontender MS: no deformity Moving all extremities   Skin: warm and dry  Mild erythema in legs  Neuro:  Grossly intact Psych: euthymic mood, full affect   EKG:  EKG isnot  ordered today.   Lipid Panel    Component Value Date/Time   CHOL 222 (H) 08/20/2021 1101   TRIG 235 (H) 08/20/2021 1101   HDL 42 08/20/2021 1101   CHOLHDL 5.3 (H) 08/20/2021 1101   CHOLHDL 6.1 11/20/2020 0517   VLDL UNABLE TO CALCULATE IF TRIGLYCERIDE OVER 400 mg/dL 11/20/2020 0517   LDLCALC 138 (H) 08/20/2021 1101   LDLDIRECT 120.2 (H) 11/20/2020 0517      Wt Readings from Last 3 Encounters:  12/09/21 297 lb (134.7 kg)  12/04/21 297 lb (134.7 kg)   11/06/21 (!) 303 lb 3.2 oz (137.5 kg)      ASSESSMENT AND PLAN:  1  HTN  BP readings are great  120s     2  DOE   Pt is doing much better   No SOB    3  LIpids   Will get lipomed, Lp(a), ApoB   Review CT  3  morbid obesity   Congratulated her on Wt loss   She is eating 2x per day   Watching carbs   Keep it up      Signed, Dorris Carnes, MD  12/09/2021 7:40 PM    [address], [place], [place]  [address] Phone: [phone]; Fax: [phone]

## 2021-12-09 NOTE — Anesthesia Preprocedure Evaluation (Signed)
Anesthesia Evaluation  Patient identified by MRN, date of birth, ID band Patient awake    Reviewed: Allergy & Precautions, NPO status , Patient's Chart, lab work & pertinent test results  History of Anesthesia Complications (+) PROLONGED EMERGENCE and history of anesthetic complications  Airway Mallampati: III  TM Distance: >3 FB Neck ROM: Full    Dental  (+) Dental Advisory Given, Teeth Intact   Pulmonary shortness of breath and with exertion, sleep apnea (SNORING???) , former smoker,           Cardiovascular Exercise Tolerance: Good hypertension, Pt. on medications + DOE  Normal cardiovascular exam Rhythm:Regular Rate:Normal  Pumping function is normal with LVEF 55-60%. She has no regional wall motion abnormalities to suggest further ischemia evaluation is needed. Grade 2 diastolic dysfunction likely contributing to lower extremity edema and shortness of breath. Would like for her to try increased Lasix 80 mg twice daily for 3 consistent days if she has not already been doing so. Call us to let us know if this helps. Will need bmet in 1 week. If she has already been on this dose, I would like to switch her to torsemide. May take up to one week for decrease in amlodipine to show improvement in leg swelling.         Neuro/Psych negative neurological ROS  negative psych ROS   GI/Hepatic Neg liver ROS, GERD (Takes essential oils)  Controlled,  Endo/Other  Morbid obesity  Renal/GU Renal InsufficiencyRenal disease  negative genitourinary   Musculoskeletal  (+) Arthritis , Osteoarthritis,    Abdominal   Peds negative pediatric ROS (+)  Hematology negative hematology ROS (+)   Anesthesia Other Findings   Reproductive/Obstetrics negative OB ROS                             Anesthesia Physical Anesthesia Plan  ASA: 3  Anesthesia Plan: General   Post-op Pain Management: Minimal or no pain  anticipated   Induction: Intravenous  PONV Risk Score and Plan: Propofol infusion  Airway Management Planned: Nasal Cannula and Natural Airway  Additional Equipment:   Intra-op Plan:   Post-operative Plan:   Informed Consent: I have reviewed the patients History and Physical, chart, labs and discussed the procedure including the risks, benefits and alternatives for the proposed anesthesia with the patient or authorized representative who has indicated his/her understanding and acceptance.     Dental advisory given  Plan Discussed with: CRNA and Surgeon  Anesthesia Plan Comments:         Anesthesia Quick Evaluation

## 2021-12-10 ENCOUNTER — Ambulatory Visit: Payer: 59 | Admitting: Internal Medicine

## 2021-12-10 ENCOUNTER — Ambulatory Visit: Payer: 59 | Admitting: Physician Assistant

## 2021-12-10 ENCOUNTER — Encounter: Payer: Self-pay | Admitting: Physician Assistant

## 2021-12-10 VITALS — BP 130/78 | HR 60 | Ht 68.0 in | Wt 297.6 lb

## 2021-12-10 DIAGNOSIS — I700 Atherosclerosis of aorta: Secondary | ICD-10-CM

## 2021-12-10 DIAGNOSIS — R0683 Snoring: Secondary | ICD-10-CM

## 2021-12-10 DIAGNOSIS — I10 Essential (primary) hypertension: Secondary | ICD-10-CM | POA: Diagnosis not present

## 2021-12-10 DIAGNOSIS — E785 Hyperlipidemia, unspecified: Secondary | ICD-10-CM

## 2021-12-10 DIAGNOSIS — I5032 Chronic diastolic (congestive) heart failure: Secondary | ICD-10-CM | POA: Diagnosis not present

## 2021-12-10 LAB — SURGICAL PATHOLOGY

## 2021-12-10 NOTE — Progress Notes (Signed)
Cardiology Office Note    Date:  12/10/2021   ID:  OVAL MORALEZ, DOB 09/17/1955, MRN [id number]   PCP:  [facility], [facility]  Cardiologist:  Dorris Carnes, MD   Advanced Practice Provider:  No care team member to display Electrophysiologist:  None   [phone]   Chief Complaint  Patient presents with   Follow-up    History of Present Illness:  MAE DENUNZIO is a 66 y.o. female with a hx of chronic HFpEF, HTN, sinus bradycardia, nonobstructive CAD, aortic atherosclerosis, hyperlipidemia, GERD, and arthritis. 04/04/19 for evaluation of chest pain. Coronary CT was ordered which revealed a calcium score of 19. She has had multiple ED and office visits for dyspnea.   11/20/20 for SOB. Her echo revealed LVEF normal LVEF at 65-70%, no rwma, no LVH, indeterminate diastolic parameters, normal RV, mild dilatation of ascending aorta at 40 mm. CT was negative for PE. Prior echo in 33/29 revealed diastolic dysfunction.She had been started on Entresto and then later spironolactone.   Patient saw Christen Bame, NP 08/19/21 with increased dyspnea and LE Edema. Was taking tumeric for elevated LDL and wearing husband's cpap. Amlodipine decreased 5 mg daily. Echo normal LVEF 55-60% and grade 2 DD. Lasix increased 80 mb bid for 3 days and LE dopplers-no DVT   Declined referral for sleep study. LDL 118 trig 171.  Patient comes in for f/u. Taking lasix 40 mg am and prn evening. She eats out a lot. Chronic DOE. Still not ready to go to lipid clinic-wants to try essential oils. Using husbands CPAP machine but needs repair at the [place]. He can't use it. Uses a boot for bone spur. Was told at colonoscopy preop that she had heart failure and wanted an explanation about this.  Past Medical History:  Diagnosis Date   Chronic diastolic heart failure (HCC)    Complication of anesthesia    difficult to wake up after anesthesia   GERD (gastroesophageal reflux disease)     Hyperlipemia    Hypertension     Past Surgical History:  Procedure Laterality Date   ABDOMINAL HYSTERECTOMY     BACK SURGERY     HEEL SPUR EXCISION     KNEE ARTHROSCOPY WITH LATERAL MENISECTOMY Right 10/18/2015   Procedure: RIGHT KNEE ARTHROSCOPY WITH LATERAL MENISECTOMY;  Surgeon: [name], MD;  Location: [facility];  Service: Orthopedics;  Laterality: Right;   TONSILLECTOMY      Current Medications: Current Meds  Medication Sig   amLODipine (NORVASC) 5 MG tablet Take 1 tablet (5 mg total) by mouth daily.   aspirin 81 MG chewable tablet Chew 81 mg by mouth at bedtime.    Cholecalciferol (VITAMIN D3) 50 MCG (2000 UT) capsule Take 2,000 Units by mouth daily at 6 (six) AM.   Flaxseed, Linseed, (FLAX SEED OIL) 1000 MG CAPS Take 40 mg by mouth daily.   furosemide (LASIX) 40 MG tablet Take 40 mg by mouth See admin instructions. Take 40 mg by mouth in the morning, may take an additional 40 mg in the afternoon if needed.   meloxicam (MOBIC) 15 MG tablet Take 1 tablet by mouth once daily   Multiple Vitamin (MULTIVITAMIN WITH MINERALS) TABS tablet Take 2 tablets by mouth daily. Doterra Brand   potassium chloride SA (KLOR-CON M20) 20 MEQ tablet Take 1 tablet (20 mEq total) by mouth daily.   sacubitril-valsartan (ENTRESTO) 49-51 MG Take 1 tablet by mouth 2 (two) times daily.  Specialty Vitamins Products (MAGNESIUM, AMINO ACID CHELATE,) 133 MG tablet Take 1 tablet by mouth daily.   spironolactone (ALDACTONE) 25 MG tablet Take 1 tablet (25 mg total) by mouth daily.   vitamin C (ASCORBIC ACID) 500 MG tablet Take 500 mg by mouth daily.   Zinc 15 MG CAPS Take 15 mg by mouth daily.     Allergies:   Atorvastatin, Doxycycline, Rosuvastatin, and Neurontin [gabapentin]   Social History   Socioeconomic History   Marital status: Married    Spouse name: Not on file   Number of children: Not on file   Years of education: Not on file   Highest education level: Not on file  Occupational History    Not on file  Tobacco Use   Smoking status: Former    Packs/day: 0.10    Years: 0.25    Pack years: 0.03    Types: Cigarettes    Start date: 07/1974    Quit date: 06/1975    Years since quitting: 46.5   Smokeless tobacco: Never   Tobacco comments:    a couple months when 10 then stopped  Vaping Use   Vaping Use: Never used  Substance and Sexual Activity   Alcohol use: No   Drug use: No   Sexual activity: Yes    Birth control/protection: Surgical  Other Topics Concern   Not on file  Social History Narrative   Not on file   Social Determinants of Health   Financial Resource Strain: Not on file  Food Insecurity: Not on file  Transportation Needs: Not on file  Physical Activity: Not on file  Stress: Not on file  Social Connections: Not on file     Family History:  The patient's  family history includes Asthma in her mother; Atrial fibrillation in her mother; COPD in her mother; Cancer in her father; Hyperlipidemia in her brother, brother, and brother; Leukemia in her mother; Stroke in her mother.   ROS:   Please see the history of present illness.    ROS All other systems reviewed and are negative.   PHYSICAL EXAM:   VS:  BP 130/78   Pulse 60   Ht '5\' 8"'$  (1.727 m)   Wt 297 lb 9.6 oz (135 kg)   SpO2 99%   BMI 45.25 kg/m   Physical Exam  GEN: Well nourished, well developed, in no acute distress  Neck: no JVD, carotid bruits, or masses Cardiac:RRR; 1/6 systolic murmur LSB Respiratory:  clear to auscultation bilaterally, normal work of breathing GI: soft, nontender, nondistended, + BS Ext: without cyanosis, clubbing, or edema, Good distal pulses bilaterally Neuro:  Alert and Oriented x 3,  Psych: euthymic mood, full affect  Wt Readings from Last 3 Encounters:  12/10/21 297 lb 9.6 oz (135 kg)  12/09/21 297 lb (134.7 kg)  12/04/21 297 lb (134.7 kg)      Studies/Labs Reviewed:   EKG:  EKG is not ordered today.    Recent Labs: 01/22/2021: NT-Pro BNP  92 12/04/2021: BUN 26; Creatinine, Ser 0.93; Potassium 3.9; Sodium 140   Lipid Panel    Component Value Date/Time   CHOL 222 (H) 08/20/2021 1101   TRIG 235 (H) 08/20/2021 1101   HDL 42 08/20/2021 1101   CHOLHDL 5.3 (H) 08/20/2021 1101   CHOLHDL 6.1 11/20/2020 0517   VLDL UNABLE TO CALCULATE IF TRIGLYCERIDE OVER 400 mg/dL 11/20/2020 0517   LDLCALC 138 (H) 08/20/2021 1101   LDLDIRECT 120.2 (H) 11/20/2020 0517    Additional studies/  records that were reviewed today include:   Echo 08/20/21 IMPRESSIONS     1. Left ventricular ejection fraction, by estimation, is 55 to 60%. Left  ventricular ejection fraction by 3D volume is 62 %. The left ventricle has  normal function. The left ventricle has no regional wall motion  abnormalities. Left ventricular diastolic   parameters are consistent with Grade II diastolic dysfunction  (pseudonormalization). Elevated left atrial pressure. The average left  ventricular global longitudinal strain is -16.6 %. The global longitudinal  strain is abnormal.   2. Right ventricular systolic function is normal. The right ventricular  size is normal.   3. Left atrial size was mildly dilated.   4. The mitral valve is normal in structure. No evidence of mitral valve  regurgitation. No evidence of mitral stenosis.   5. The aortic valve is normal in structure. Aortic valve regurgitation is  not visualized. No aortic stenosis is present.   6. There is mild dilatation of the ascending aorta, measuring 40 mm.   7. The inferior vena cava is normal in size with greater than 50%  respiratory variability, suggesting right atrial pressure of 3 mmHg.   Comparison(s): No significant change from prior study. Prior images  reviewed side by side. EF 65%, ascen aorta 73m.   FINDINGS    Risk Assessment/Calculations:         ASSESSMENT:    1. Chronic heart failure with preserved ejection fraction (HBigelow   2. Essential hypertension   3. Aortic atherosclerosis  (HTryon   4. Hyperlipidemia LDL goal <70   5. Snoring   6. Obesity, Class III, BMI 40-49.9 (morbid obesity) (HCC)      PLAN:  In order of problems listed above:  HFpEF echo 07/2021 normal LVEF grade 2 DD-managed with lasix 40 mg daily and and extra prn, also on entresto, spiro. Labs 12/04/21 stable  HTN BP controlled on above meds  CAD on CT-trying essential oils to reduce cholesterol. Not willing to see lipid clinic at this time  HLD LDL 118 taking tumeric. Dr. RHarrington Challengerreocmmended referal to lipid clinic and repatha but not trying essential oils  Snoring-uses husband's CPAP but currently broken and she will use it again when it's fixed. declines sleep study  Obesity. Continues to try weight loss. Recommend continued weight loss, and 150 min exercise weekly  Shared Decision Making/Informed Consent        Medication Adjustments/Labs and Tests Ordered: Current medicines are reviewed at length with the patient today.  Concerns regarding medicines are outlined above.  Medication changes, Labs and Tests ordered today are listed in the Patient Instructions below. Patient Instructions  Medication Instructions:  Your physician recommends that you continue on your current medications as directed. Please refer to the Current Medication list given to you today.  *If you need a refill on your cardiac medications before your next appointment, please call your pharmacy*   Lab Work: NONE   If you have labs (blood work) drawn today and your tests are completely normal, you will receive your results only by: [place](if you have MyChart) OR A paper copy in the mail If you have any lab test that is abnormal or we need to change your treatment, we will call you to review the results.   Testing/Procedures: NONE    Follow-Up: [facility] and your health needs are our priority.  As part of our continuing mission to provide you with exceptional heart care, we have created  designated  Provider Care Teams.  These Care Teams include your primary Cardiologist (physician) and Advanced Practice Providers (APPs -  Physician Assistants and Nurse Practitioners) who all work together to provide you with the care you need, when you need it.  We recommend signing up for the patient portal called "MyChart".  Sign up information is provided on this After Visit Summary.  MyChart is used to connect with patients for Virtual Visits (Telemedicine).  Patients are able to view lab/test results, encounter notes, upcoming appointments, etc.  Non-urgent messages can be sent to your provider as well.   To learn more about what you can do with MyChart, go to [email or web address].    Your next appointment:   1 year(s)  The format for your next appointment:   In Person  Provider:   Dorris Carnes, MD    Other Instructions Thank you for choosing [facility]!  150 Minutes of exercise weekly  Your physician discussed the importance of regular exercise and recommended that you start or continue a regular exercise program for good health.   Important Information About Sugar         Sumner Boast, PA-C  12/10/2021 12:03 PM    [facility] [address], [place], [place]  [address] Phone: [phone]; Fax: [phone]

## 2021-12-10 NOTE — Patient Instructions (Signed)
Medication Instructions:  Your physician recommends that you continue on your current medications as directed. Please refer to the Current Medication list given to you today.  *If you need a refill on your cardiac medications before your next appointment, please call your pharmacy*   Lab Work: NONE   If you have labs (blood work) drawn today and your tests are completely normal, you will receive your results only by: [place] (if you have MyChart) OR A paper copy in the mail If you have any lab test that is abnormal or we need to change your treatment, we will call you to review the results.   Testing/Procedures: NONE    Follow-Up: At [facility], you and your health needs are our priority.  As part of our continuing mission to provide you with exceptional heart care, we have created designated Provider Care Teams.  These Care Teams include your primary Cardiologist (physician) and Advanced Practice Providers (APPs -  Physician Assistants and Nurse Practitioners) who all work together to provide you with the care you need, when you need it.  We recommend signing up for the patient portal called "MyChart".  Sign up information is provided on this After Visit Summary.  MyChart is used to connect with patients for Virtual Visits (Telemedicine).  Patients are able to view lab/test results, encounter notes, upcoming appointments, etc.  Non-urgent messages can be sent to your provider as well.   To learn more about what you can do with MyChart, go to [email or web address].    Your next appointment:   1 year(s)  The format for your next appointment:   In Person  Provider:   Dorris Carnes, MD    Other Instructions Thank you for choosing [facility]!  150 Minutes of exercise weekly  Your physician discussed the importance of regular exercise and recommended that you start or continue a regular exercise program for good health.   Important Information About  Sugar

## 2021-12-17 ENCOUNTER — Encounter (HOSPITAL_COMMUNITY): Payer: Self-pay | Admitting: Internal Medicine

## 2021-12-23 ENCOUNTER — Telehealth: Payer: Self-pay | Admitting: Internal Medicine

## 2021-12-23 NOTE — Telephone Encounter (Signed)
New Message:     Patient would like to switch from Dr Harrington Challenger to Dr Buford Dresser. Is this alright with you both?

## 2021-12-23 NOTE — Telephone Encounter (Signed)
Ok by me

## 2021-12-24 NOTE — Progress Notes (Signed)
Cardiology Office Note:    Date:  12/26/2021   ID:  Meagan Reyes, DOB 04-Jul-1956, MRN [id number]  PCP:  Redmond School, MD  Cardiologist:  Buford Dresser, MD  Referring MD: Redmond School, MD   CC: establish care/general cardiology follow up  History of Present Illness:    Meagan Reyes is a 66 y.o. female with a hx of chronic diastolic heart failure, sinus bradycardia, nonobstructive CAD, aortic atherosclerosis, hypertension, hyperlipidemia, and GERD, who is seen for follow-up evaluation of shortness of breath. She was previously followed by Dr. Harrington Challenger, last seen by her 05/13/2021 where she was generally doing well.  Meagan Reyes was last seen in cardiology by Ermalinda Barrios, PA-C on 12/10/21. She reported chronic DOE and was taking 40 mg Lasix in the morning with prn evening doses. She had been using her husband's CPAP but it was broken at that visit.  She notes that she presents today to discuss possible treatments and management to improve her breathing. Also, she has been told her heart is not resting between heart beats.  Cardiovascular  Comorbid conditions: Hypertension. Hyperlipidemia- Crestor was previously stopped due to achiness. Was trying essential oils to reduce cholesterol. Prior cardiac testing and/or incidental findings on other testing (ie coronary calcium): Prior Coronary CT 05/2019 revealed calcium score of 19. Previously admitted to [facility] 11/20/20 for SOB. CT was negative for PE or lung issues, and Echo showed normal LVEF. Exercise level: Her shortness of breath is very noticeable with activity such as bending over while doing laundry, and walking. She usually pushes through her shortness of breath while walking. They have a woodworking shop. Her husband will also watch her breathing and encourages her to sit down and rest when needed. Sometimes she feels short of breath with lying down. However, they have a bed that raises and they usually sleep on an  incline. Current diet: She has struggled with weight loss. Previously she has only been successful with losing weight when purposely eating only 2 meals a day. However, this affects her blood sugar. When she tried 3 small meals her weight loss was still minimal.  She endorses occasional lightheadedness, but denies any presyncope or loss of consciousness.  She has bilateral LE edema that she notes can be much worse by the end of the day. Every morning she takes 2 tablets of Lasix, which she believes does help.   At the end of this month she plans to take their CPAP to the [place] for repairs.  She denies any palpitations, chest pain, headaches, or PND.  Past Medical History:  Diagnosis Date   Chronic diastolic heart failure ([place])    Complication of anesthesia    difficult to wake up after anesthesia   GERD (gastroesophageal reflux disease)    Hyperlipemia    Hypertension     Past Surgical History:  Procedure Laterality Date   ABDOMINAL HYSTERECTOMY     BACK SURGERY     COLONOSCOPY WITH PROPOFOL N/A 12/09/2021   Procedure: COLONOSCOPY WITH PROPOFOL;  Surgeon: [name], DO;  Location: [facility];  Service: Endoscopy;  Laterality: N/A;  9:15am   HEEL SPUR EXCISION     KNEE ARTHROSCOPY WITH LATERAL MENISECTOMY Right 10/18/2015   Procedure: RIGHT KNEE ARTHROSCOPY WITH LATERAL MENISECTOMY;  Surgeon: [name], MD;  Location: [facility];  Service: Orthopedics;  Laterality: Right;   POLYPECTOMY  12/09/2021   Procedure: POLYPECTOMY INTESTINAL;  Surgeon: [name], DO;  Location: [facility];  Service: Endoscopy;;   TONSILLECTOMY      Current Medications: Current Outpatient Medications on File Prior to Visit  Medication Sig   aspirin 81 MG chewable tablet Chew 81 mg by mouth at bedtime.    Cholecalciferol (VITAMIN D3) 50 MCG (2000 UT) capsule Take 2,000 Units by mouth daily at 6 (six) AM.   Flaxseed, Linseed, (FLAX SEED OIL) 1000 MG CAPS Take 40 mg by mouth daily.    furosemide (LASIX) 40 MG tablet Take 40 mg by mouth See admin instructions. Take 40 mg by mouth in the morning, may take an additional 40 mg in the afternoon if needed.   meloxicam (MOBIC) 15 MG tablet Take 1 tablet by mouth once daily   Multiple Vitamin (MULTIVITAMIN WITH MINERALS) TABS tablet Take 2 tablets by mouth daily. Doterra Brand   sacubitril-valsartan (ENTRESTO) 49-51 MG Take 1 tablet by mouth 2 (two) times daily.   Specialty Vitamins Products (MAGNESIUM, AMINO ACID CHELATE,) 133 MG tablet Take 1 tablet by mouth daily.   vitamin C (ASCORBIC ACID) 500 MG tablet Take 500 mg by mouth daily.   Zinc 15 MG CAPS Take 15 mg by mouth daily.   amLODipine (NORVASC) 5 MG tablet Take 1 tablet (5 mg total) by mouth daily.   potassium chloride SA (KLOR-CON M20) 20 MEQ tablet Take 1 tablet (20 mEq total) by mouth daily.   spironolactone (ALDACTONE) 25 MG tablet Take 1 tablet (25 mg total) by mouth daily.   No current facility-administered medications on file prior to visit.     Allergies:   Atorvastatin, Doxycycline, Rosuvastatin, and Neurontin [gabapentin]   Social History   Tobacco Use   Smoking status: Former    Packs/day: 0.10    Years: 0.25    Total pack years: 0.03    Types: Cigarettes    Start date: 07/1974    Quit date: 06/1975    Years since quitting: 46.5   Smokeless tobacco: Never   Tobacco comments:    a couple months when 83 then stopped  Vaping Use   Vaping Use: Never used  Substance Use Topics   Alcohol use: No   Drug use: No    Family History: family history includes Asthma in her mother; Atrial fibrillation in her mother; COPD in her mother; Cancer in her father; Hyperlipidemia in her brother, brother, and brother; Leukemia in her mother; Stroke in her mother. There is no history of Colon cancer or Colon polyps.  ROS:   Please see the history of present illness.  Additional pertinent ROS: Constitutional: Negative for chills, fever, night sweats, unintentional  weight loss  HENT: Negative for ear pain and hearing loss.   Eyes: Negative for loss of vision and eye pain.  Respiratory: Negative for cough, sputum, wheezing. Positive for shortness of breath. Cardiovascular: See HPI. Gastrointestinal: Negative for abdominal pain, melena, and hematochezia.  Genitourinary: Negative for dysuria and hematuria.  Musculoskeletal: Negative for falls and myalgias.  Skin: Negative for itching and rash.  Neurological: Negative for focal weakness, focal sensory changes and loss of consciousness. Positive for lightheadedness. Endo/Heme/Allergies: Does not bruise/bleed easily.     EKGs/Labs/Other Studies Reviewed:    The following studies were reviewed today:  Echo  08/20/2021:  1. Left ventricular ejection fraction, by estimation, is 55 to 60%. Left  ventricular ejection fraction by 3D volume is 62 %. The left ventricle has  normal function. The left ventricle has no regional wall motion  abnormalities. Left ventricular diastolic   parameters are consistent  with Grade II diastolic dysfunction  (pseudonormalization). Elevated left atrial pressure. The average left  ventricular global longitudinal strain is -16.6 %. The global longitudinal  strain is abnormal.   2. Right ventricular systolic function is normal. The right ventricular  size is normal.   3. Left atrial size was mildly dilated.   4. The mitral valve is normal in structure. No evidence of mitral valve  regurgitation. No evidence of mitral stenosis.   5. The aortic valve is normal in structure. Aortic valve regurgitation is  not visualized. No aortic stenosis is present.   6. There is mild dilatation of the ascending aorta, measuring 40 mm.   7. The inferior vena cava is normal in size with greater than 50%  respiratory variability, suggesting right atrial pressure of 3 mmHg.   Comparison(s): No significant change from prior study. Prior images  reviewed side by side. EF 65%, ascen aorta 11m.    CTA Chest 11/19/2020: FINDINGS: Cardiovascular: Satisfactory opacification of the pulmonary arteries to the segmental level. No evidence of pulmonary embolism. New mild cardiomegaly. No pericardial effusion.   Mediastinum/Nodes: No enlarged lymph nodes. Thyroid and esophagus are unremarkable. Very small hiatal hernia.   Lungs/Pleura: No pleural effusion or pneumothorax. There is patchy bilateral ground-glass density.   Upper Abdomen: No acute abnormality.   Musculoskeletal: No acute osseous abnormality.   Review of the MIP images confirms the above findings.   IMPRESSION: No evidence acute pulmonary embolism.   Patchy bilateral ground-glass density. May reflect edema given new mild cardiomegaly.  Coronary CT 05/30/2019: FINDINGS: Non-cardiac: [facility]   Pulmonary veins drain normally to the left atrium.   Calcium Score: 1.9 WU   Coronary Arteries: Left dominant with no anomalies   LM: No plaque or stenosis.   LAD system: No plaque or stenosis. Large high D1 covers ramus territory.   Circumflex system: Large, dominant LCx. Mixed plaque ostially with minimal stenosis.   RCA system: Small, nondominant RCA with no plaque or stenosis.   IMPRESSION: 1. Coronary artery calcium score 1.9 WU. This places the patient in the 60th percentile for age and gender, suggesting intermediate risk for future cardiac events.   2.  Nonobstructive mild CAD.  EKG:  EKG is personally reviewed.   12/26/2021: not ordered today  Recent Labs: 01/22/2021: NT-Pro BNP 92 12/04/2021: BUN 26; Creatinine, Ser 0.93; Potassium 3.9; Sodium 140   Recent Lipid Panel    Component Value Date/Time   CHOL 222 (H) 08/20/2021 1101   TRIG 235 (H) 08/20/2021 1101   HDL 42 08/20/2021 1101   CHOLHDL 5.3 (H) 08/20/2021 1101   CHOLHDL 6.1 11/20/2020 0517   VLDL UNABLE TO CALCULATE IF TRIGLYCERIDE OVER 400 mg/dL 11/20/2020 0517   LDLCALC 138 (H) 08/20/2021 1101    LDLDIRECT 120.2 (H) 11/20/2020 0517    Physical Exam:    VS:  BP 126/72   Pulse 63   Ht '5\' 8"'$  (1.727 m)   Wt 299 lb 8 oz (135.9 kg)   SpO2 99%   BMI 45.54 kg/m     Wt Readings from Last 3 Encounters:  12/26/21 299 lb 8 oz (135.9 kg)  12/10/21 297 lb 9.6 oz (135 kg)  12/09/21 297 lb (134.7 kg)    GEN: Well nourished, well developed in no acute distress HEENT: Normal, moist mucous membranes NECK: No JVD CARDIAC: regular rhythm, normal S1 and S2, no rubs or gallops. No murmur. VASCULAR: Radial and DP pulses 2+ bilaterally. No carotid bruits  RESPIRATORY:  Clear to auscultation without rales, wheezing or rhonchi  ABDOMEN: Soft, non-tender, non-distended MUSCULOSKELETAL:  Ambulates independently SKIN: Warm and dry, + bilateral LE edema NEUROLOGIC:  Alert and oriented x 3. No focal neuro deficits noted. PSYCHIATRIC:  Normal affect    ASSESSMENT:    1. Chronic diastolic (congestive) heart failure ([place])   2. Essential hypertension   3. Dyspnea, unspecified type   4. Medication management   5. Nonocclusive coronary atherosclerosis of native coronary artery   6. Mixed hyperlipidemia   7. Cardiac risk counseling   8. Counseling on health promotion and disease prevention   9. Metabolic syndrome    PLAN:    Chronic diastolic heart failure Dyspnea -takes lasix every AM -discussed SGLT2i, she is willing to trial. Check BMET in several weeks -already on entresto 49-51 mg BID and spironolactone 25 mg daily  Chronic LE edema, suspect component of chronic venous insufficiency -discussed compression, elevation -discussed amlodipine may contribute but isn't likely to be the primary issue  Hypertension: at goal -continue amlodipine 5 mg daily -continue entresto, spironolactone as above  Hypercholesterolemia and hypertriglyceridemia Nonobstructive CAD based on CCTA -LDL goal <70, last 138 -had muscle aches on rosuvastatin -discussed recommendations, trial of another statin.  Declines at this time -is on aspirin 81 mg daily -reviewed red flag warning signs that need immediate medical attention  BMI 45, obesity -this plus lipid pattern, hypertension, A1c of 6.5 (diagnostic of diabetes) support metabolic syndrome -starting SGLT2i for HFPEF, but consider GLP1RA (if covered by insurance)  OSA: uses her [facility]'s [facility]  Cardiac risk counseling and prevention recommendations: -recommend heart healthy/Mediterranean diet, with whole grains, fruits, vegetable, fish, lean meats, nuts, and olive oil. Limit salt. -recommend moderate walking, 3-5 times/week for 30-50 minutes each session. Aim for at least 150 minutes.week. Goal should be pace of 3 miles/hours, or walking 1.5 miles in 30 minutes -recommend avoidance of tobacco products. Avoid excess alcohol. -ASCVD risk score: The 10-year ASCVD risk score (Arnett DK, et al., 2019) is: 14.6%   Values used to calculate the score:     Age: 21 years     Sex: Female     Is Non-Hispanic African American: No     Diabetic: Yes     Tobacco smoker: No     Systolic Blood Pressure: 970 mmHg     Is BP treated: Yes     HDL Cholesterol: 42 mg/dL     Total Cholesterol: 222 mg/dL    Plan for follow up: 3 months or sooner as needed.  Total time of encounter: 55 minutes total time of encounter, including 47 minutes spent in face-to-face patient care. This time includes coordination of care and counseling regarding above conditions. Remainder of non-face-to-face time involved reviewing chart documents/testing relevant to the patient encounter and documentation in the medical record.  Buford Dresser, MD, PhD, [facility]    Medication Adjustments/Labs and Tests Ordered: Current medicines are reviewed at length with the patient today.  Concerns regarding medicines are outlined above.   Orders Placed This Encounter  Procedures   Basic metabolic panel   Meds ordered this encounter   Medications   DISCONTD: empagliflozin (JARDIANCE) 10 MG TABS tablet    Sig: Take 1 tablet (10 mg total) by mouth daily before breakfast.    Dispense:  30 tablet    Refill:  11    Please wait to fill until patient calls   Patient Instructions  Medication Instructions:  START: Jardiance 10 mg daily  *If you need a refill on your cardiac medications before your next appointment, please call your pharmacy*   Lab Work: Your physician recommends that you return for lab work in 2 weeks (BMP)  If you have labs (blood work) drawn today and your tests are completely normal, you will receive your results only by: [place] (if you have MyChart) OR A paper copy in the mail If you have any lab test that is abnormal or we need to change your treatment, we will call you to review the results.   Testing/Procedures: None ordered today   Follow-Up: At [facility], you and your health needs are our priority.  As part of our continuing mission to provide you with exceptional heart care, we have created designated Provider Care Teams.  These Care Teams include your primary Cardiologist (physician) and Advanced Practice Providers (APPs -  Physician Assistants and Nurse Practitioners) who all work together to provide you with the care you need, when you need it.  We recommend signing up for the patient portal called "MyChart".  Sign up information is provided on this After Visit Summary.  MyChart is used to connect with patients for Virtual Visits (Telemedicine).  Patients are able to view lab/test results, encounter notes, upcoming appointments, etc.  Non-urgent messages can be sent to your provider as well.   To learn more about what you can do with MyChart, go to [email or web address].    Your next appointment:   3 month(s)  The format for your next appointment:   In Person  Provider:   Buford Dresser, MD{   Look into Elastic Therapy for fitted compression stockings: [address] [place], [place], [place] [address] [phone] [email or web address]    I,Mathew Stumpf,acting as a Education administrator for [employer], MD.,have documented all relevant documentation on the behalf of Buford Dresser, MD,as directed by  Buford Dresser, MD while in the presence of Buford Dresser, MD.  I, Buford Dresser, MD, have reviewed all documentation for this visit. The documentation on 01/20/22 for the exam, diagnosis, procedures, and orders are all accurate and complete.   Signed, Buford Dresser, MD PhD 12/26/2021     [place]

## 2021-12-26 ENCOUNTER — Encounter (HOSPITAL_BASED_OUTPATIENT_CLINIC_OR_DEPARTMENT_OTHER): Payer: Self-pay | Admitting: Cardiology

## 2021-12-26 ENCOUNTER — Ambulatory Visit (HOSPITAL_BASED_OUTPATIENT_CLINIC_OR_DEPARTMENT_OTHER): Payer: 59 | Admitting: Cardiology

## 2021-12-26 VITALS — BP 126/72 | HR 63 | Ht 68.0 in | Wt 299.5 lb

## 2021-12-26 DIAGNOSIS — Z79899 Other long term (current) drug therapy: Secondary | ICD-10-CM

## 2021-12-26 DIAGNOSIS — E8881 Metabolic syndrome: Secondary | ICD-10-CM

## 2021-12-26 DIAGNOSIS — E782 Mixed hyperlipidemia: Secondary | ICD-10-CM

## 2021-12-26 DIAGNOSIS — I2510 Atherosclerotic heart disease of native coronary artery without angina pectoris: Secondary | ICD-10-CM

## 2021-12-26 DIAGNOSIS — I5032 Chronic diastolic (congestive) heart failure: Secondary | ICD-10-CM

## 2021-12-26 DIAGNOSIS — R0600 Dyspnea, unspecified: Secondary | ICD-10-CM

## 2021-12-26 DIAGNOSIS — Z7189 Other specified counseling: Secondary | ICD-10-CM

## 2021-12-26 DIAGNOSIS — I10 Essential (primary) hypertension: Secondary | ICD-10-CM

## 2021-12-26 MED ORDER — EMPAGLIFLOZIN 10 MG PO TABS: 10.0000 mg | ORAL_TABLET | Freq: Every day | ORAL | 11 refills | Status: DC

## 2021-12-26 NOTE — Patient Instructions (Addendum)
Medication Instructions:  START: Jardiance 10 mg daily  *If you need a refill on your cardiac medications before your next appointment, please call your pharmacy*   Lab Work: Your physician recommends that you return for lab work in 2 weeks (BMP)  If you have labs (blood work) drawn today and your tests are completely normal, you will receive your results only by: [place] (if you have MyChart) OR A paper copy in the mail If you have any lab test that is abnormal or we need to change your treatment, we will call you to review the results.   Testing/Procedures: None ordered today   Follow-Up: At [facility], you and your health needs are our priority.  As part of our continuing mission to provide you with exceptional heart care, we have created designated Provider Care Teams.  These Care Teams include your primary Cardiologist (physician) and Advanced Practice Providers (APPs -  Physician Assistants and Nurse Practitioners) who all work together to provide you with the care you need, when you need it.  We recommend signing up for the patient portal called "MyChart".  Sign up information is provided on this After Visit Summary.  MyChart is used to connect with patients for Virtual Visits (Telemedicine).  Patients are able to view lab/test results, encounter notes, upcoming appointments, etc.  Non-urgent messages can be sent to your provider as well.   To learn more about what you can do with MyChart, go to [email or web address].    Your next appointment:   3 month(s)  The format for your next appointment:   In Person  Provider:   Buford Dresser, MD{   Look into Elastic Therapy for fitted compression stockings: [address] [place], [place], [place] [address] [phone] [email or web address]

## 2022-01-09 ENCOUNTER — Encounter (HOSPITAL_BASED_OUTPATIENT_CLINIC_OR_DEPARTMENT_OTHER): Payer: Self-pay

## 2022-01-09 DIAGNOSIS — I5032 Chronic diastolic (congestive) heart failure: Secondary | ICD-10-CM

## 2022-01-10 MED ORDER — EMPAGLIFLOZIN 10 MG PO TABS: 10.0000 mg | ORAL_TABLET | Freq: Every day | ORAL | 1 refills | Status: DC

## 2022-01-11 LAB — BASIC METABOLIC PANEL
BUN/Creatinine Ratio: 18 (ref 12–28)
BUN: 20 mg/dL (ref 8–27)
CO2: 23 mmol/L (ref 20–29)
Calcium: 9.6 mg/dL (ref 8.7–10.3)
Chloride: 102 mmol/L (ref 96–106)
Creatinine, Ser: 1.09 mg/dL — ABNORMAL HIGH (ref 0.57–1.00)
Glucose: 109 mg/dL — ABNORMAL HIGH (ref 70–99)
Potassium: 4.1 mmol/L (ref 3.5–5.2)
Sodium: 142 mmol/L (ref 134–144)
eGFR: 57 mL/min/{1.73_m2} — ABNORMAL LOW (ref >59)

## 2022-01-20 ENCOUNTER — Encounter (HOSPITAL_BASED_OUTPATIENT_CLINIC_OR_DEPARTMENT_OTHER): Payer: Self-pay | Admitting: Cardiology

## 2022-01-26 ENCOUNTER — Other Ambulatory Visit: Payer: Self-pay | Admitting: Internal Medicine

## 2022-01-27 ENCOUNTER — Other Ambulatory Visit: Payer: Self-pay

## 2022-01-27 MED ORDER — SPIRONOLACTONE 25 MG PO TABS: 25.0000 mg | ORAL_TABLET | Freq: Every day | ORAL | 1 refills | Status: DC

## 2022-01-27 NOTE — Telephone Encounter (Signed)
Pt of Dr.Christopher  Refiled spironolactone 25 mg #90, RF:1  to [facility] drug

## 2022-01-30 ENCOUNTER — Other Ambulatory Visit: Payer: Self-pay

## 2022-01-30 DIAGNOSIS — Z79899 Other long term (current) drug therapy: Secondary | ICD-10-CM

## 2022-01-31 ENCOUNTER — Other Ambulatory Visit: Payer: Self-pay | Admitting: Internal Medicine

## 2022-01-31 NOTE — Telephone Encounter (Signed)
Rx(s) sent to pharmacy electronically.  

## 2022-02-07 ENCOUNTER — Telehealth: Payer: Self-pay | Admitting: Podiatry

## 2022-02-07 NOTE — Telephone Encounter (Signed)
I'm calling in reference to another payment on my account for my visit on 12/05/21. Please return my call. Thank you.

## 2022-02-07 NOTE — Telephone Encounter (Signed)
Called patient and she stated she got an EOB saying she did not owe her copay from her visit on 12/05/21. Stated she spoke to [place] and they told her she could have that $40 transferred to their account. Patient would like this done and gave the account number of [id number]. I told Meagan Reyes I would send this to Jocelyn Lamer who will follow up when she's back in the office next week and that Jocelyn Lamer would call her if she has any questions she needs to follow up on.

## 2022-02-10 ENCOUNTER — Encounter (HOSPITAL_BASED_OUTPATIENT_CLINIC_OR_DEPARTMENT_OTHER): Payer: Self-pay

## 2022-03-14 ENCOUNTER — Other Ambulatory Visit (HOSPITAL_BASED_OUTPATIENT_CLINIC_OR_DEPARTMENT_OTHER): Payer: Self-pay | Admitting: Cardiology

## 2022-03-15 LAB — BASIC METABOLIC PANEL
BUN/Creatinine Ratio: 31 — ABNORMAL HIGH (ref 12–28)
BUN: 26 mg/dL (ref 8–27)
CO2: 23 mmol/L (ref 20–29)
Calcium: 9.7 mg/dL (ref 8.7–10.3)
Chloride: 100 mmol/L (ref 96–106)
Creatinine, Ser: 0.85 mg/dL (ref 0.57–1.00)
Glucose: 86 mg/dL (ref 70–99)
Potassium: 4.1 mmol/L (ref 3.5–5.2)
Sodium: 141 mmol/L (ref 134–144)
eGFR: 76 mL/min/{1.73_m2} (ref >59)

## 2022-03-27 ENCOUNTER — Encounter (HOSPITAL_BASED_OUTPATIENT_CLINIC_OR_DEPARTMENT_OTHER): Payer: Self-pay | Admitting: Cardiology

## 2022-03-27 ENCOUNTER — Ambulatory Visit (INDEPENDENT_AMBULATORY_CARE_PROVIDER_SITE_OTHER): Payer: HMO | Admitting: Cardiology

## 2022-03-27 VITALS — BP 128/76 | HR 69 | Ht 68.0 in | Wt 294.4 lb

## 2022-03-27 DIAGNOSIS — I2510 Atherosclerotic heart disease of native coronary artery without angina pectoris: Secondary | ICD-10-CM | POA: Diagnosis not present

## 2022-03-27 DIAGNOSIS — E782 Mixed hyperlipidemia: Secondary | ICD-10-CM | POA: Diagnosis not present

## 2022-03-27 DIAGNOSIS — I872 Venous insufficiency (chronic) (peripheral): Secondary | ICD-10-CM

## 2022-03-27 DIAGNOSIS — I5032 Chronic diastolic (congestive) heart failure: Secondary | ICD-10-CM

## 2022-03-27 DIAGNOSIS — E8881 Metabolic syndrome: Secondary | ICD-10-CM

## 2022-03-27 NOTE — Patient Instructions (Signed)

## 2022-03-27 NOTE — Progress Notes (Signed)
Cardiology Office Note:    Date:  03/27/2022   ID:  Meagan Reyes, DOB 1956-01-20, MRN [id number]  PCP:  Redmond School, MD  Cardiologist:  Buford Dresser, MD  Referring MD: Redmond School, MD   CC: general cardiology follow up  History of Present Illness:    Meagan Reyes is a 66 y.o. female with a hx of chronic diastolic heart failure, sinus bradycardia, nonobstructive CAD, aortic atherosclerosis, hypertension, hyperlipidemia, and GERD, who is seen for follow-up.   Meagan Reyes was last seen in cardiology by Ermalinda Barrios, PA-C on 12/10/21. She reported chronic DOE and was taking 40 mg Lasix in the morning with prn evening doses. She had been using her husband's CPAP but it was broken at that visit.  Cardiovascular  Comorbid conditions: Hypertension. Hyperlipidemia- Crestor was previously stopped due to achiness. Was trying essential oils to reduce cholesterol. Prior cardiac testing and/or incidental findings on other testing (ie coronary calcium): Prior Coronary CT 05/2019 revealed calcium score of 19. Previously admitted to [facility] 11/20/20 for SOB. CT was negative for PE or lung issues, and Echo showed normal LVEF. Exercise level: Her shortness of breath is very noticeable with activity such as bending over while doing laundry, and walking. She usually pushes through her shortness of breath while walking. They have a woodworking shop. Her husband will also watch her breathing and encourages her to sit down and rest when needed. Sometimes she feels short of breath with lying down. However, they have a bed that raises and they usually sleep on an incline. Current diet: She has struggled with weight loss. Previously she has only been successful with losing weight when purposely eating only 2 meals a day. However, this affects her blood sugar. When she tried 3 small meals her weight loss was still minimal.  At her last appointment she noted bilateral LE edema that could  be much worse by the end of day. Every morning she took 2 tablets of Lasix, which seemed to help. Also complained of occasional lightheadedness. Her LDL at that visit was 138. We discussed recommendations, and trial of another statin (prior muscle aches on rosuvastatin) which she declined. She was willing to trial Jardiance.  Today: She states she has been doing better overall. Yesterday she did notice some breathing issues, but this was in the setting of working outside in the hot weather for 2 days. Her breathing is stable today. She denies any associated chest pains with her shortness of breath.  She endorses constant yet stable LE edema. In the evenings she will elevate her legs.  She expresses concerns that her medication may become cost prohibitive in a few months. She is turning 66 yo and notes she will be in the "donut hole" after about 3 months.  She denies any palpitations, lightheadedness, headaches, syncope, orthopnea, or PND.   Past Medical History:  Diagnosis Date   Chronic diastolic heart failure ([place])    Complication of anesthesia    difficult to wake up after anesthesia   GERD (gastroesophageal reflux disease)    Hyperlipemia    Hypertension     Past Surgical History:  Procedure Laterality Date   ABDOMINAL HYSTERECTOMY     BACK SURGERY     COLONOSCOPY WITH PROPOFOL N/A 12/09/2021   Procedure: COLONOSCOPY WITH PROPOFOL;  Surgeon: [name], DO;  Location: [facility];  Service: Endoscopy;  Laterality: N/A;  9:15am   HEEL SPUR EXCISION     KNEE ARTHROSCOPY WITH LATERAL  MENISECTOMY Right 10/18/2015   Procedure: RIGHT KNEE ARTHROSCOPY WITH LATERAL MENISECTOMY;  Surgeon: [name], MD;  Location: [facility] OR[facility];  Service: Orthopedics;  Laterality: Right;   POLYPECTOMY  12/09/2021   Procedure: POLYPECTOMY INTESTINAL;  Surgeon: [name], DO;  Location: [facility];  Service: Endoscopy;;   TONSILLECTOMY      Current Medications: Current Outpatient  Medications on File Prior to Visit  Medication Sig   amLODipine (NORVASC) 5 MG tablet Take 1 tablet (5 mg total) by mouth daily.   aspirin 81 MG chewable tablet Chew 81 mg by mouth at bedtime.    Cholecalciferol (VITAMIN D3) 50 MCG (2000 UT) capsule Take 2,000 Units by mouth daily at 6 (six) AM.   empagliflozin (JARDIANCE) 10 MG TABS tablet Take 1 tablet (10 mg total) by mouth daily before breakfast.   ENTRESTO 49-51 MG TAKE 1 TABLET BY MOUTH TWICE DAILY (DOSE INCREASE)   Flaxseed, Linseed, (FLAX SEED OIL) 1000 MG CAPS Take 40 mg by mouth daily.   furosemide (LASIX) 40 MG tablet Take 40 mg by mouth See admin instructions. Take 40 mg by mouth in the morning, may take an additional 40 mg in the afternoon if needed.   meloxicam (MOBIC) 15 MG tablet Take 1 tablet by mouth once daily   Multiple Vitamin (MULTIVITAMIN WITH MINERALS) TABS tablet Take 2 tablets by mouth daily. Doterra Brand   potassium chloride SA (KLOR-CON M20) 20 MEQ tablet Take 1 tablet (20 mEq total) by mouth daily.   Specialty Vitamins Products (MAGNESIUM, AMINO ACID CHELATE,) 133 MG tablet Take 1 tablet by mouth daily.   spironolactone (ALDACTONE) 25 MG tablet Take 1 tablet (25 mg total) by mouth daily.   vitamin C (ASCORBIC ACID) 500 MG tablet Take 500 mg by mouth daily.   Zinc 15 MG CAPS Take 15 mg by mouth daily.   No current facility-administered medications on file prior to visit.     Allergies:   Atorvastatin, Doxycycline, Rosuvastatin, and Neurontin [gabapentin]   Social History   Tobacco Use   Smoking status: Former    Packs/day: 0.10    Years: 0.25    Total pack years: 0.03    Types: Cigarettes    Start date: 07/1974    Quit date: 06/1975    Years since quitting: 46.7   Smokeless tobacco: Never   Tobacco comments:    a couple months when 61 then stopped  Vaping Use   Vaping Use: Never used  Substance Use Topics   Alcohol use: No   Drug use: No    Family History: family history includes Asthma in her  mother; Atrial fibrillation in her mother; COPD in her mother; Cancer in her father; Hyperlipidemia in her brother, brother, and brother; Leukemia in her mother; Stroke in her mother. There is no history of Colon cancer or Colon polyps.  ROS:   Please see the history of present illness. (+) Shortness of breath (+) Bilateral LE edema All other systems are reviewed and negative.    EKGs/Labs/Other Studies Reviewed:    The following studies were reviewed today:  Echo  08/20/2021:  1. Left ventricular ejection fraction, by estimation, is 55 to 60%. Left  ventricular ejection fraction by 3D volume is 62 %. The left ventricle has  normal function. The left ventricle has no regional wall motion  abnormalities. Left ventricular diastolic   parameters are consistent with Grade II diastolic dysfunction  (pseudonormalization). Elevated left atrial pressure. The average left  ventricular global  longitudinal strain is -16.6 %. The global longitudinal  strain is abnormal.   2. Right ventricular systolic function is normal. The right ventricular  size is normal.   3. Left atrial size was mildly dilated.   4. The mitral valve is normal in structure. No evidence of mitral valve  regurgitation. No evidence of mitral stenosis.   5. The aortic valve is normal in structure. Aortic valve regurgitation is  not visualized. No aortic stenosis is present.   6. There is mild dilatation of the ascending aorta, measuring 40 mm.   7. The inferior vena cava is normal in size with greater than 50%  respiratory variability, suggesting right atrial pressure of 3 mmHg.   Comparison(s): No significant change from prior study. Prior images  reviewed side by side. EF 65%, ascen aorta 22m.   CTA Chest 11/19/2020: FINDINGS: Cardiovascular: Satisfactory opacification of the pulmonary arteries to the segmental level. No evidence of pulmonary embolism. New mild cardiomegaly. No pericardial effusion.    Mediastinum/Nodes: No enlarged lymph nodes. Thyroid and esophagus are unremarkable. Very small hiatal hernia.   Lungs/Pleura: No pleural effusion or pneumothorax. There is patchy bilateral ground-glass density.   Upper Abdomen: No acute abnormality.   Musculoskeletal: No acute osseous abnormality.   Review of the MIP images confirms the above findings.   IMPRESSION: No evidence acute pulmonary embolism.   Patchy bilateral ground-glass density. May reflect edema given new mild cardiomegaly.  Coronary CT 05/30/2019: FINDINGS: Non-cardiac: [facility]   Pulmonary veins drain normally to the left atrium.   Calcium Score: 1.9 WU   Coronary Arteries: Left dominant with no anomalies   LM: No plaque or stenosis.   LAD system: No plaque or stenosis. Large high D1 covers ramus territory.   Circumflex system: Large, dominant LCx. Mixed plaque ostially with minimal stenosis.   RCA system: Small, nondominant RCA with no plaque or stenosis.   IMPRESSION: 1. Coronary artery calcium score 1.9 WU. This places the patient in the 60th percentile for age and gender, suggesting intermediate risk for future cardiac events.   2.  Nonobstructive mild CAD.  EKG:  EKG is personally reviewed.   03/27/2022:  EKG was not ordered. 12/26/2021: not ordered  Recent Labs: 03/14/2022: BUN 26; Creatinine, Ser 0.85; Potassium 4.1; Sodium 141   Recent Lipid Panel    Component Value Date/Time   CHOL 222 (H) 08/20/2021 1101   TRIG 235 (H) 08/20/2021 1101   HDL 42 08/20/2021 1101   CHOLHDL 5.3 (H) 08/20/2021 1101   CHOLHDL 6.1 11/20/2020 0517   VLDL UNABLE TO CALCULATE IF TRIGLYCERIDE OVER 400 mg/dL 11/20/2020 0517   LDLCALC 138 (H) 08/20/2021 1101   LDLDIRECT 120.2 (H) 11/20/2020 0517    Physical Exam:    VS:  BP 128/76 (BP Location: Right Arm, Patient Position: Sitting, Cuff Size: Large)   Pulse 69   Ht '5\' 8"'$  (1.727 m)   Wt 294 lb 6.4 oz (133.5 kg)    SpO2 94%   BMI 44.76 kg/m     Wt Readings from Last 3 Encounters:  03/27/22 294 lb 6.4 oz (133.5 kg)  12/26/21 299 lb 8 oz (135.9 kg)  12/10/21 297 lb 9.6 oz (135 kg)    GEN: Well nourished, well developed in no acute distress HEENT: Normal, moist mucous membranes NECK: No JVD CARDIAC: regular rhythm, normal S1 and S2, no rubs or gallops. No murmur. VASCULAR: Radial and DP pulses 2+ bilaterally. No carotid bruits RESPIRATORY:  Clear to  auscultation without rales, wheezing or rhonchi  ABDOMEN: Soft, non-tender, non-distended MUSCULOSKELETAL:  Ambulates independently SKIN: Warm and dry, trivial bilateral LE edema NEUROLOGIC:  Alert and oriented x 3. No focal neuro deficits noted. PSYCHIATRIC:  Normal affect    ASSESSMENT:    1. Chronic diastolic (congestive) heart failure ([place])   2. Nonocclusive coronary atherosclerosis of native coronary artery   3. Mixed hyperlipidemia   4. Metabolic syndrome   5. Obesity, Class III, BMI 40-49.9 (morbid obesity) ([place])   6. Chronic venous insufficiency     PLAN:    Chronic diastolic heart failure Dyspnea -takes lasix every AM -doing well on Jardiance 10 mg -already on entresto 49-51 mg BID and spironolactone 25 mg daily -concern is that she will be transitioning to Medicare in the coming weeks. She has been told that Jardiance and Delene Loll may be $800/month. She should be able to get to the end of this year, and then three months into next year, before hitting the donut hole. At that time, we will need to see if the medications are affordable. If not, may have to drop to valsartan instead of entresto and drop Jardiance. We discussed that this is not ideal, but if we cannot make it affordable for her, we may not have a choice.  Chronic LE edema, suspect component of chronic venous insufficiency -discussed compression, elevation -discussed amlodipine may contribute but isn't likely to be the primary issue  Hypertension: at goal -continue  amlodipine 5 mg daily -continue entresto, spironolactone as above  Hypercholesterolemia and hypertriglyceridemia Nonobstructive CAD based on CCTA -LDL goal <70, last 138 -had muscle aches on rosuvastatin -discussed recommendations, trial of another statin. Declines at this time -is on aspirin 81 mg daily -reviewed red flag warning signs that need immediate medical attention  BMI 44, obesity -this plus lipid pattern, hypertension, A1c of 6.5 (diagnostic of diabetes) support metabolic syndrome -would like GLP1RA with this, but not currently covered by medicare without diabetes  OSA: uses her husband's CPAP, [facility]  Cardiac risk counseling and prevention recommendations: -recommend heart healthy/Mediterranean diet, with whole grains, fruits, vegetable, fish, lean meats, nuts, and olive oil. Limit salt. -recommend moderate walking, 3-5 times/week for 30-50 minutes each session. Aim for at least 150 minutes.week. Goal should be pace of 3 miles/hours, or walking 1.5 miles in 30 minutes -recommend avoidance of tobacco products. Avoid excess alcohol. -ASCVD risk score: The 10-year ASCVD risk score (Arnett DK, et al., 2019) is: 15%   Values used to calculate the score:     Age: 51 years     Sex: Female     Is Non-Hispanic African American: No     Diabetic: Yes     Tobacco smoker: No     Systolic Blood Pressure: 119 mmHg     Is BP treated: Yes     HDL Cholesterol: 42 mg/dL     Total Cholesterol: 222 mg/dL    Plan for follow up: 6 months or sooner as needed.  Buford Dresser, MD, PhD, [facility]    Medication Adjustments/Labs and Tests Ordered: Current medicines are reviewed at length with the patient today.  Concerns regarding medicines are outlined above.   No orders of the defined types were placed in this encounter.  No orders of the defined types were placed in this encounter.  Patient Instructions  Medication Instructions:  Your Physician  recommend you continue on your current medication as directed.    *If you need a  refill on your cardiac medications before your next appointment, please call your pharmacy*   Lab Work: None ordered today   Testing/Procedures: None ordered today   Follow-Up: At [facility], you and your health needs are our priority.  As part of our continuing mission to provide you with exceptional heart care, we have created designated Provider Care Teams.  These Care Teams include your primary Cardiologist (physician) and Advanced Practice Providers (APPs -  Physician Assistants and Nurse Practitioners) who all work together to provide you with the care you need, when you need it.  We recommend signing up for the patient portal called "MyChart".  Sign up information is provided on this After Visit Summary.  MyChart is used to connect with patients for Virtual Visits (Telemedicine).  Patients are able to view lab/test results, encounter notes, upcoming appointments, etc.  Non-urgent messages can be sent to your provider as well.   To learn more about what you can do with MyChart, go to [email or web address].    Your next appointment:   6 month(s)  The format for your next appointment:   In Person  Provider:   Buford Dresser, MD             [facility],acting as a scribe for Buford Dresser, MD.,have documented all relevant documentation on the behalf of Buford Dresser, MD,as directed by  Buford Dresser, MD while in the presence of Buford Dresser, MD.  I, Buford Dresser, MD, have reviewed all documentation for this visit. The documentation on 03/27/22 for the exam, diagnosis, procedures, and orders are all accurate and complete.   Signed, Buford Dresser, MD PhD 03/27/2022     [place]

## 2022-03-28 ENCOUNTER — Other Ambulatory Visit: Payer: Self-pay | Admitting: Cardiology

## 2022-03-31 NOTE — Telephone Encounter (Signed)
Rx(s) sent to pharmacy electronically.  

## 2022-05-09 ENCOUNTER — Other Ambulatory Visit (HOSPITAL_COMMUNITY): Payer: Self-pay | Admitting: Internal Medicine

## 2022-05-09 DIAGNOSIS — Z1231 Encounter for screening mammogram for malignant neoplasm of breast: Secondary | ICD-10-CM

## 2022-05-29 ENCOUNTER — Ambulatory Visit (HOSPITAL_COMMUNITY): Payer: HMO

## 2022-06-02 ENCOUNTER — Ambulatory Visit (HOSPITAL_COMMUNITY)
Admission: RE | Admit: 2022-06-02 | Discharge: 2022-06-02 | Disposition: A | Discharge status: Home / Self Care | Payer: HMO | Source: Ambulatory Visit | Attending: Internal Medicine | Admitting: Internal Medicine

## 2022-06-02 DIAGNOSIS — Z1231 Encounter for screening mammogram for malignant neoplasm of breast: Secondary | ICD-10-CM | POA: Diagnosis not present

## 2022-06-23 ENCOUNTER — Other Ambulatory Visit (HOSPITAL_BASED_OUTPATIENT_CLINIC_OR_DEPARTMENT_OTHER): Payer: Self-pay | Admitting: Cardiology

## 2022-06-23 DIAGNOSIS — I5032 Chronic diastolic (congestive) heart failure: Secondary | ICD-10-CM

## 2022-06-23 NOTE — Telephone Encounter (Signed)
Rx request sent to pharmacy.  

## 2022-06-24 ENCOUNTER — Other Ambulatory Visit: Payer: Self-pay | Admitting: Cardiology

## 2022-06-24 NOTE — Telephone Encounter (Signed)
Rx request sent to pharmacy.  

## 2022-07-01 DIAGNOSIS — M47816 Spondylosis without myelopathy or radiculopathy, lumbar region: Secondary | ICD-10-CM | POA: Diagnosis not present

## 2022-07-01 DIAGNOSIS — M5441 Lumbago with sciatica, right side: Secondary | ICD-10-CM | POA: Diagnosis not present

## 2022-07-01 DIAGNOSIS — M9903 Segmental and somatic dysfunction of lumbar region: Secondary | ICD-10-CM | POA: Diagnosis not present

## 2022-07-02 DIAGNOSIS — M47816 Spondylosis without myelopathy or radiculopathy, lumbar region: Secondary | ICD-10-CM | POA: Diagnosis not present

## 2022-07-02 DIAGNOSIS — M9903 Segmental and somatic dysfunction of lumbar region: Secondary | ICD-10-CM | POA: Diagnosis not present

## 2022-07-02 DIAGNOSIS — M5441 Lumbago with sciatica, right side: Secondary | ICD-10-CM | POA: Diagnosis not present

## 2022-07-04 DIAGNOSIS — M47816 Spondylosis without myelopathy or radiculopathy, lumbar region: Secondary | ICD-10-CM | POA: Diagnosis not present

## 2022-07-04 DIAGNOSIS — M5441 Lumbago with sciatica, right side: Secondary | ICD-10-CM | POA: Diagnosis not present

## 2022-07-04 DIAGNOSIS — M9903 Segmental and somatic dysfunction of lumbar region: Secondary | ICD-10-CM | POA: Diagnosis not present

## 2022-07-08 DIAGNOSIS — M5441 Lumbago with sciatica, right side: Secondary | ICD-10-CM | POA: Diagnosis not present

## 2022-07-08 DIAGNOSIS — M47816 Spondylosis without myelopathy or radiculopathy, lumbar region: Secondary | ICD-10-CM | POA: Diagnosis not present

## 2022-07-08 DIAGNOSIS — M9903 Segmental and somatic dysfunction of lumbar region: Secondary | ICD-10-CM | POA: Diagnosis not present

## 2022-07-11 DIAGNOSIS — M5441 Lumbago with sciatica, right side: Secondary | ICD-10-CM | POA: Diagnosis not present

## 2022-07-11 DIAGNOSIS — M9903 Segmental and somatic dysfunction of lumbar region: Secondary | ICD-10-CM | POA: Diagnosis not present

## 2022-07-11 DIAGNOSIS — M47816 Spondylosis without myelopathy or radiculopathy, lumbar region: Secondary | ICD-10-CM | POA: Diagnosis not present

## 2022-07-22 DIAGNOSIS — M9903 Segmental and somatic dysfunction of lumbar region: Secondary | ICD-10-CM | POA: Diagnosis not present

## 2022-07-22 DIAGNOSIS — M5441 Lumbago with sciatica, right side: Secondary | ICD-10-CM | POA: Diagnosis not present

## 2022-07-22 DIAGNOSIS — M47816 Spondylosis without myelopathy or radiculopathy, lumbar region: Secondary | ICD-10-CM | POA: Diagnosis not present

## 2022-07-31 DIAGNOSIS — M9903 Segmental and somatic dysfunction of lumbar region: Secondary | ICD-10-CM | POA: Diagnosis not present

## 2022-07-31 DIAGNOSIS — M5441 Lumbago with sciatica, right side: Secondary | ICD-10-CM | POA: Diagnosis not present

## 2022-07-31 DIAGNOSIS — M47816 Spondylosis without myelopathy or radiculopathy, lumbar region: Secondary | ICD-10-CM | POA: Diagnosis not present

## 2022-08-07 DIAGNOSIS — M5441 Lumbago with sciatica, right side: Secondary | ICD-10-CM | POA: Diagnosis not present

## 2022-08-07 DIAGNOSIS — M47816 Spondylosis without myelopathy or radiculopathy, lumbar region: Secondary | ICD-10-CM | POA: Diagnosis not present

## 2022-08-07 DIAGNOSIS — M9903 Segmental and somatic dysfunction of lumbar region: Secondary | ICD-10-CM | POA: Diagnosis not present

## 2022-08-18 ENCOUNTER — Encounter (HOSPITAL_BASED_OUTPATIENT_CLINIC_OR_DEPARTMENT_OTHER): Payer: Self-pay | Admitting: Family

## 2022-08-18 ENCOUNTER — Ambulatory Visit (INDEPENDENT_AMBULATORY_CARE_PROVIDER_SITE_OTHER): Payer: PPO | Admitting: Family

## 2022-08-18 VITALS — BP 92/66 | HR 75 | Ht 68.0 in | Wt 300.0 lb

## 2022-08-18 DIAGNOSIS — I2510 Atherosclerotic heart disease of native coronary artery without angina pectoris: Secondary | ICD-10-CM

## 2022-08-18 DIAGNOSIS — E785 Hyperlipidemia, unspecified: Secondary | ICD-10-CM | POA: Diagnosis not present

## 2022-08-18 DIAGNOSIS — I5032 Chronic diastolic (congestive) heart failure: Secondary | ICD-10-CM

## 2022-08-18 DIAGNOSIS — I10 Essential (primary) hypertension: Secondary | ICD-10-CM | POA: Diagnosis not present

## 2022-08-18 DIAGNOSIS — I872 Venous insufficiency (chronic) (peripheral): Secondary | ICD-10-CM | POA: Diagnosis not present

## 2022-08-18 MED ORDER — ENTRESTO 49-51 MG PO TABS: 0.5000 | ORAL_TABLET | Freq: Two times a day (BID) | ORAL | 11 refills | Status: DC

## 2022-08-18 MED ORDER — FUROSEMIDE 40 MG PO TABS: 40.0000 mg | ORAL_TABLET | Freq: Two times a day (BID) | ORAL | 3 refills | Status: DC

## 2022-08-18 NOTE — Progress Notes (Signed)
Office Visit    Patient Name: BRITLEY GASHI Date of Encounter: 08/18/2022  PCP:  [facility], [place]  Cardiologist:  Buford Dresser, MD  Advanced Practice Provider:  No care team member to display Electrophysiologist:  None   Chief Complaint    ESPERANZA MADRAZO is a 67 y.o. female presents today for shortness of breath, chest tightness  Past Medical History    Past Medical History:  Diagnosis Date   Chronic diastolic heart failure ([place])    Complication of anesthesia    difficult to wake up after anesthesia   GERD (gastroesophageal reflux disease)    Hyperlipemia    Hypertension    Past Surgical History:  Procedure Laterality Date   ABDOMINAL HYSTERECTOMY     BACK SURGERY     COLONOSCOPY WITH PROPOFOL N/A 12/09/2021   Procedure: COLONOSCOPY WITH PROPOFOL;  Surgeon: [name], DO;  Location: [facility];  Service: Endoscopy;  Laterality: N/A;  9:15am   HEEL SPUR EXCISION     KNEE ARTHROSCOPY WITH LATERAL MENISECTOMY Right 10/18/2015   Procedure: RIGHT KNEE ARTHROSCOPY WITH LATERAL MENISECTOMY;  Surgeon: [name], MD;  Location: [facility];  Service: Orthopedics;  Laterality: Right;   POLYPECTOMY  12/09/2021   Procedure: POLYPECTOMY INTESTINAL;  Surgeon: [name], DO;  Location: [facility];  Service: Endoscopy;;   TONSILLECTOMY      Allergies  Allergies  Allergen Reactions   Atorvastatin Other (See Comments)    Myalgia; pt reports this occurred in past, was prescribed by different doctor.   Doxycycline     Shortness or breath one time another time had symptoms of heart attack   Rosuvastatin     Joint pain on '5mg'$  daily   Neurontin [Gabapentin] Rash    History of Present Illness    JOELLYN GRANDT is a 67 y.o. female with a hx of chronic diastolic heart failure, sinus bradycardia, nonobstructive coronary artery disease, aortic atherosclerosis, hypertension, hyperlipidemia, GERD last seen  03/27/2022 by Dr. Harrell Gave.  Cardiac CTA 05/2019 calcium score of 19. 11/19/2020 to [facility] for shortness of breath.  CTA negative for PE.  Echo with normal LVEF. Most recent echo 08/20/21 LVEF 60%, no RWMA, gr2DD, elevated LA pressure, mild dilation ascending arota 41m. Crestor previously stopped due to achiness. She has declined alternate statin.   Last saw Dr. CHarrell Gave9/ 7/23. She was doing overall well and no changes made.  Presents today for follow up with her husband. Notes increasing dyspnea since Christmas. She also notes she has not been able to be as active and has gained weight. More shortness of breath with activity but also noted at rest. She notes mild orthopnea but no PND. She is having trouble falling and staying asleep. Feels she is not exerting enough energy to be tired. She notes increased lower extremity edema. She is taking Furosemide '40mg'$  daily with additional afternoon dose just once since last seen. She notes she is feeling lightheaded but not dizzy. [place] and was pale. No hematuria, melena. Notes wheezing as well productive cough with thick, clear sputum in the morning. She notes chest tightness associated with heart racing that happens twice per day and lasts a few minutes and goes away as she focuses on relaxing. Her heart rate on her pule omixeter was 101 bpm which is higher than usual.   EKGs/Labs/Other Studies Reviewed:  The following studies were reviewed today:  Echo  08/20/2021:  1. Left ventricular ejection fraction, by estimation, is 55 to 60%. Left  ventricular ejection fraction by 3D volume is 62 %. The left ventricle has  normal function. The left ventricle has no regional wall motion  abnormalities. Left ventricular diastolic   parameters are consistent with Grade II diastolic dysfunction  (pseudonormalization). Elevated left atrial pressure. The average left  ventricular global  longitudinal strain is -16.6 %. The global longitudinal  strain is abnormal.   2. Right ventricular systolic function is normal. The right ventricular  size is normal.   3. Left atrial size was mildly dilated.   4. The mitral valve is normal in structure. No evidence of mitral valve  regurgitation. No evidence of mitral stenosis.   5. The aortic valve is normal in structure. Aortic valve regurgitation is  not visualized. No aortic stenosis is present.   6. There is mild dilatation of the ascending aorta, measuring 40 mm.   7. The inferior vena cava is normal in size with greater than 50%  respiratory variability, suggesting right atrial pressure of 3 mmHg.   Comparison(s): No significant change from prior study. Prior images  reviewed side by side. EF 65%, ascen aorta 61m.    CTA Chest 11/19/2020: FINDINGS: Cardiovascular: Satisfactory opacification of the pulmonary arteries to the segmental level. No evidence of pulmonary embolism. New mild cardiomegaly. No pericardial effusion.   Mediastinum/Nodes: No enlarged lymph nodes. Thyroid and esophagus are unremarkable. Very small hiatal hernia.   Lungs/Pleura: No pleural effusion or pneumothorax. There is patchy bilateral ground-glass density.   Upper Abdomen: No acute abnormality.   Musculoskeletal: No acute osseous abnormality.   Review of the MIP images confirms the above findings.   IMPRESSION: No evidence acute pulmonary embolism.   Patchy bilateral ground-glass density. May reflect edema given new mild cardiomegaly.   Coronary CT 05/30/2019: FINDINGS: Non-cardiac: [facility]   Pulmonary veins drain normally to the left atrium.   Calcium Score: 1.9 WU   Coronary Arteries: Left dominant with no anomalies   LM: No plaque or stenosis.   LAD system: No plaque or stenosis. Large high D1 covers ramus territory.   Circumflex system: Large, dominant LCx. Mixed plaque ostially  with minimal stenosis.   RCA system: Small, nondominant RCA with no plaque or stenosis.   IMPRESSION: 1. Coronary artery calcium score 1.9 WU. This places the patient in the 60th percentile for age and gender, suggesting intermediate risk for future cardiac events.   2.  Nonobstructive mild CAD.  EKG:  EKG is ordered today.  The ekg ordered today demonstrates NSR 75 bpm with no acute ST/T wave changes.   Recent Labs: 03/14/2022: BUN 26; Creatinine, Ser 0.85; Potassium 4.1; Sodium 141  Recent Lipid Panel    Component Value Date/Time   CHOL 222 (H) 08/20/2021 1101   TRIG 235 (H) 08/20/2021 1101   HDL 42 08/20/2021 1101   CHOLHDL 5.3 (H) 08/20/2021 1101   CHOLHDL 6.1 11/20/2020 0517   VLDL UNABLE TO CALCULATE IF TRIGLYCERIDE OVER 400 mg/dL 11/20/2020 0517   LDLCALC 138 (H) 08/20/2021 1101   LDLDIRECT 120.2 (H) 11/20/2020 0517     Home Medications   Current Meds  Medication Sig   aspirin 81 MG chewable tablet Chew 81 mg by mouth at bedtime.    Cholecalciferol (VITAMIN D3) 50 MCG (2000 UT) capsule Take 2,000 Units by mouth daily at 6 (six) AM.   EDelene Loll  49-51 MG TAKE 1 TABLET BY MOUTH TWICE DAILY (DOSE INCREASE)   Flaxseed, Linseed, (FLAX SEED OIL) 1000 MG CAPS Take 40 mg by mouth daily.   furosemide (LASIX) 40 MG tablet Take 40 mg by mouth See admin instructions. Take 40 mg by mouth in the morning, may take an additional 40 mg in the afternoon if needed.   JARDIANCE 10 MG TABS tablet TAKE 1 TABLET BY MOUTH DAILY BEFORE BREAKFAST   meloxicam (MOBIC) 15 MG tablet Take 1 tablet by mouth once daily   Multiple Vitamin (MULTIVITAMIN WITH MINERALS) TABS tablet Take 2 tablets by mouth daily. Doterra Brand   potassium chloride SA (KLOR-CON M) 20 MEQ tablet TAKE 1 TABLET BY MOUTH DAILY   Specialty Vitamins Products (MAGNESIUM, AMINO ACID CHELATE,) 133 MG tablet Take 1 tablet by mouth daily.   spironolactone (ALDACTONE) 25 MG tablet TAKE 1 TABLET BY MOUTH DAILY   vitamin C (ASCORBIC  ACID) 500 MG tablet Take 500 mg by mouth daily.   Zinc 15 MG CAPS Take 15 mg by mouth daily.     Review of Systems      All other systems reviewed and are otherwise negative except as noted above.  Physical Exam    VS:  BP 92/66   Pulse 75   Ht '5\' 8"'$  (1.727 m)   Wt 300 lb (136.1 kg)   BMI 45.61 kg/m  , BMI Body mass index is 45.61 kg/m.  Wt Readings from Last 3 Encounters:  08/18/22 300 lb (136.1 kg)  03/27/22 294 lb 6.4 oz (133.5 kg)  12/26/21 299 lb 8 oz (135.9 kg)     GEN: Well nourished, overweight, well developed, in no acute distress. HEENT: normal. Neck: Supple, no JVD, carotid bruits, or masses. Cardiac: RRR, no murmurs, rubs, or gallops. No clubbing, cyanosis. Non pitting bilateral LE edema. .  Radials/PT 2+ and equal bilaterally.  Respiratory:  Respirations regular and unlabored, clear to auscultation bilaterally. GI: Soft, nontender, nondistended. MS: No deformity or atrophy. Skin: Warm and dry, no rash. Neuro:  Strength and sensation are intact. Psych: Normal affect.  Assessment & Plan    Dyspnea / Chest tightness - >1 month history of worsening exertional dyspnea, orthopnea, weight gain. Notes intermittent chest tightness when she feels her heart race. No exertional chest discomfort. EKG today nonacute. Optimize heart failure therapy with increased diuresis, as below. If symptoms persist, consider ischemic evaluation.  Chronic diastolic heart failure - Exacerbation noted today. Weight gain of 6 pounds (max weight gain at home 8 pounds per her report). Increase Lasix to '40mg'$  BID. To prevent hypotension and due to lightheadedness, reduce Entresto to 24-'26mg'$  BID (she will take half of her current 49-'51mg'$  tablet BID).  Continue Jardiance, spironolactone. CBC today to rule out anemia. BMP, BNP to assess volume status. Update echocardiogram. Low sodium diet, fluid restriction <2L, and daily weights encouraged. Educated to contact our office for weight gain of 2 lbs  overnight or 5 lbs in one week. Check in via MyChart in one week.  LE edema / venous insufficiency - Notes worsening lower extremity edema. Adjust diuresis as above. Encouraged to continue to elevate lower extremities.   Financial constraint - Concern regarding branded medications in Medicare. Will provide patient assistance for [place]. She will return these applications at her next OV  HTN - Hypotensive today in clinic with lightheadedness. Reduce Entresto to 24-'26mg'$  BID. Discussed to monitor BP at home at least 2 hours after medications and sitting for 5-10 minutes.  Mild nonobstructive CAD - CTA 05/2019 coronary calcium score 1.9. Declines statin, as below.   HLD, LDL goal <70 - Myalgias with crestor. Has declined additional statin.  Obesity - Weight loss via diet and exercise encouraged. Discussed the impact being overweight would have on cardiovascular risk. Consider PREP at follow up. Unfortunately Medicare does not cover weight loss medications in the absence of DM2.  OSA - CPAP compliance encouraged.          Disposition: Follow up in 2-3 week(s) with Buford Dresser, MD or APP.  Signed, Loel Dubonnet, NP 08/18/2022, 10:14 AM [place]

## 2022-08-18 NOTE — Patient Instructions (Addendum)
Medication Instructions:  Your physician has recommended you make the following change in your medication:   REDUCE Entresto to half tablet twice per day  INCREASE Furosemide (Lasix) to '40mg'$  twice daily Take first dose in the morning and second dose in the afternoon   *If you need a refill on your cardiac medications before your next appointment, please call your pharmacy*   Lab Work: Your physician recommends that you return for lab work today: BMP, BNP, CBC  If you have labs (blood work) drawn today and your tests are completely normal, you will receive your results only by: [place] (if you have [place]) OR A paper copy in the mail If you have any lab test that is abnormal or we need to change your treatment, we will call you to review the results.   Testing/Procedures: Your EKG today shows normal sinus rhythm which is a good result.  Your physician has requested that you have an echocardiogram. Echocardiography is a painless test that uses sound waves to create images of your heart. It provides your doctor with information about the size and shape of your heart and how well your heart's chambers and valves are working. This procedure takes approximately one hour. There are no restrictions for this procedure. Please do NOT wear cologne, perfume, aftershave, or lotions (deodorant is allowed). Please arrive 15 minutes prior to your appointment time.   Follow-Up: At [facility], you and your health needs are our priority.  As part of our continuing mission to provide you with exceptional heart care, we have created designated Provider Care Teams.  These Care Teams include your primary Cardiologist (physician) and Advanced Practice Providers (APPs -  Physician Assistants and Nurse Practitioners) who all work together to provide you with the care you need, when you need it.  We recommend signing up for the patient portal called "MyChart".  Sign up information is provided  on this After Visit Summary.  MyChart is used to connect with patients for Virtual Visits (Telemedicine).  Patients are able to view lab/test results, encounter notes, upcoming appointments, etc.  Non-urgent messages can be sent to your provider as well.   To learn more about what you can do with MyChart, go to [email or web address].    Your next appointment:   2-3 weeks  Provider:   Buford Dresser, MD or Laurann Montana, NP    Other Instructions  To prevent or reduce lower extremity swelling: Eat a low salt diet. Salt makes the body hold onto extra fluid which causes swelling. Sit with legs elevated. For example, in the recliner or on an [place].  Wear knee-high compression stockings during the daytime. Ones labeled 15-20 mmHg provide good compression.   Try to restrict to less than 2 liter (64 oz) of fluid per day.

## 2022-08-20 ENCOUNTER — Ambulatory Visit (INDEPENDENT_AMBULATORY_CARE_PROVIDER_SITE_OTHER): Payer: PPO

## 2022-08-20 DIAGNOSIS — I872 Venous insufficiency (chronic) (peripheral): Secondary | ICD-10-CM

## 2022-08-20 DIAGNOSIS — I2510 Atherosclerotic heart disease of native coronary artery without angina pectoris: Secondary | ICD-10-CM

## 2022-08-20 DIAGNOSIS — I5032 Chronic diastolic (congestive) heart failure: Secondary | ICD-10-CM

## 2022-08-20 LAB — ECHOCARDIOGRAM COMPLETE
Area-P 1/2: 3.48 cm2
S' Lateral: 2.62 cm

## 2022-08-20 LAB — CBC
Hematocrit: 44.4 % (ref 34.0–46.6)
Hemoglobin: 14.7 g/dL (ref 11.1–15.9)
MCH: 29.2 pg (ref 26.6–33.0)
MCHC: 33.1 g/dL (ref 31.5–35.7)
MCV: 88 fL (ref 79–97)
Platelets: 276 10*3/uL (ref 150–450)
RBC: 5.03 x10E6/uL (ref 3.77–5.28)
RDW: 13.6 % (ref 11.7–15.4)
WBC: 10.9 10*3/uL — ABNORMAL HIGH (ref 3.4–10.8)

## 2022-08-20 LAB — BASIC METABOLIC PANEL
BUN/Creatinine Ratio: 22 (ref 12–28)
BUN: 25 mg/dL (ref 8–27)
CO2: 25 mmol/L (ref 20–29)
Calcium: 9.8 mg/dL (ref 8.7–10.3)
Chloride: 99 mmol/L (ref 96–106)
Creatinine, Ser: 1.14 mg/dL — ABNORMAL HIGH (ref 0.57–1.00)
Glucose: 139 mg/dL — ABNORMAL HIGH (ref 70–99)
Potassium: 3.9 mmol/L (ref 3.5–5.2)
Sodium: 142 mmol/L (ref 134–144)
eGFR: 53 mL/min/{1.73_m2} — ABNORMAL LOW (ref >59)

## 2022-08-20 LAB — BRAIN NATRIURETIC PEPTIDE: BNP: 9.9 pg/mL (ref 0.0–100.0)

## 2022-08-21 ENCOUNTER — Telehealth (HOSPITAL_BASED_OUTPATIENT_CLINIC_OR_DEPARTMENT_OTHER): Payer: Self-pay

## 2022-08-21 DIAGNOSIS — M9903 Segmental and somatic dysfunction of lumbar region: Secondary | ICD-10-CM | POA: Diagnosis not present

## 2022-08-21 DIAGNOSIS — M5441 Lumbago with sciatica, right side: Secondary | ICD-10-CM | POA: Diagnosis not present

## 2022-08-21 DIAGNOSIS — I10 Essential (primary) hypertension: Secondary | ICD-10-CM

## 2022-08-21 DIAGNOSIS — M47816 Spondylosis without myelopathy or radiculopathy, lumbar region: Secondary | ICD-10-CM | POA: Diagnosis not present

## 2022-08-21 NOTE — Telephone Encounter (Addendum)
Results called to patient who verbalizes understanding! Echo and labs ordered, lab slips mailed to patient.     ----- Message from Meagan Dubonnet, NP sent at 08/21/2022  8:08 AM EST ----- CBC with no anemia.  White blood cell count very mildly elevated, not of concern.  BNP w normal however this may be falsely low related to her weight..  Kidney function decreased from previous.  May be related to excess volume which we noted in clinic.  Normal electrolytes.  Recommend continue Lasix 40 mg twice per day until weight returns to 294 pounds.  When weight returns to 294 pounds, decreased to Lasix 40 mg once daily. Repeat BMP in 1 week for monitoring.  Meagan Dubonnet, NP  Gerald Stabs, RN Echocardiogram reveals normal heart pumping function.  Mild thickening of the heart muscle which we prevent from worsening by keeping blood pressure well-controlled.  No significant valvular abnormalities.  Mild dilation of the ascending aorta.  Repeat ultrasound in 1 year for monitoring.  This is unchanged from previous study. Good result!

## 2022-08-25 ENCOUNTER — Encounter (HOSPITAL_BASED_OUTPATIENT_CLINIC_OR_DEPARTMENT_OTHER): Payer: Self-pay

## 2022-08-26 DIAGNOSIS — E119 Type 2 diabetes mellitus without complications: Secondary | ICD-10-CM | POA: Diagnosis not present

## 2022-08-26 DIAGNOSIS — Z0001 Encounter for general adult medical examination with abnormal findings: Secondary | ICD-10-CM | POA: Diagnosis not present

## 2022-08-26 DIAGNOSIS — Z6841 Body Mass Index (BMI) 40.0 and over, adult: Secondary | ICD-10-CM | POA: Diagnosis not present

## 2022-08-26 DIAGNOSIS — Z1331 Encounter for screening for depression: Secondary | ICD-10-CM | POA: Diagnosis not present

## 2022-08-26 DIAGNOSIS — E270 Other adrenocortical overactivity: Secondary | ICD-10-CM | POA: Diagnosis not present

## 2022-08-26 DIAGNOSIS — I700 Atherosclerosis of aorta: Secondary | ICD-10-CM | POA: Diagnosis not present

## 2022-08-26 DIAGNOSIS — I10 Essential (primary) hypertension: Secondary | ICD-10-CM | POA: Diagnosis not present

## 2022-08-26 DIAGNOSIS — I5032 Chronic diastolic (congestive) heart failure: Secondary | ICD-10-CM | POA: Diagnosis not present

## 2022-08-26 DIAGNOSIS — E261 Secondary hyperaldosteronism: Secondary | ICD-10-CM | POA: Diagnosis not present

## 2022-08-28 DIAGNOSIS — M5441 Lumbago with sciatica, right side: Secondary | ICD-10-CM | POA: Diagnosis not present

## 2022-08-28 DIAGNOSIS — M47816 Spondylosis without myelopathy or radiculopathy, lumbar region: Secondary | ICD-10-CM | POA: Diagnosis not present

## 2022-08-28 DIAGNOSIS — M9903 Segmental and somatic dysfunction of lumbar region: Secondary | ICD-10-CM | POA: Diagnosis not present

## 2022-08-28 NOTE — Telephone Encounter (Signed)
Labs 08/26/22 via [facility] DXA  creatinine 1.0, GFR 63, K 4.4, Na 139 AST 31 ALT 51 total cholesterol 244, triglycerides 203, HDL 55, LDL 152 Hb 144.4 TSH 1.9  Loel Dubonnet, NP

## 2022-08-28 NOTE — Telephone Encounter (Signed)
Update as requested

## 2022-08-29 ENCOUNTER — Other Ambulatory Visit: Payer: Self-pay | Admitting: Family

## 2022-08-29 ENCOUNTER — Other Ambulatory Visit (HOSPITAL_BASED_OUTPATIENT_CLINIC_OR_DEPARTMENT_OTHER): Payer: Self-pay | Admitting: Nurse Practitioner

## 2022-08-29 NOTE — Telephone Encounter (Signed)
Rx request sent to pharmacy.  

## 2022-09-01 NOTE — Telephone Encounter (Signed)
Pt. Following up with you

## 2022-09-04 DIAGNOSIS — M5441 Lumbago with sciatica, right side: Secondary | ICD-10-CM | POA: Diagnosis not present

## 2022-09-04 DIAGNOSIS — M9903 Segmental and somatic dysfunction of lumbar region: Secondary | ICD-10-CM | POA: Diagnosis not present

## 2022-09-04 DIAGNOSIS — M47816 Spondylosis without myelopathy or radiculopathy, lumbar region: Secondary | ICD-10-CM | POA: Diagnosis not present

## 2022-09-05 DIAGNOSIS — I10 Essential (primary) hypertension: Secondary | ICD-10-CM | POA: Diagnosis not present

## 2022-09-06 LAB — BASIC METABOLIC PANEL
BUN/Creatinine Ratio: 18 (ref 12–28)
BUN: 7 mg/dL — ABNORMAL LOW (ref 8–27)
CO2: 18 mmol/L — ABNORMAL LOW (ref 20–29)
Calcium: 9 mg/dL (ref 8.7–10.3)
Chloride: 105 mmol/L (ref 96–106)
Creatinine, Ser: 0.4 mg/dL — ABNORMAL LOW (ref 0.57–1.00)
Glucose: 88 mg/dL (ref 70–99)
Potassium: 4.2 mmol/L (ref 3.5–5.2)
Sodium: 139 mmol/L (ref 134–144)
eGFR: 110 mL/min/{1.73_m2} (ref >59)

## 2022-09-08 ENCOUNTER — Telehealth (HOSPITAL_BASED_OUTPATIENT_CLINIC_OR_DEPARTMENT_OTHER): Payer: Self-pay

## 2022-09-08 DIAGNOSIS — I10 Essential (primary) hypertension: Secondary | ICD-10-CM

## 2022-09-08 NOTE — Telephone Encounter (Addendum)
Results called to patient who verbalizes understanding! Patient states that she had labs checked with Dr. Gerarda Fraction- we cannot see these in the system but patient states he A1C is down to 5.6, her B12 and Vitamin D were both low. They have started her on supplementation for these! Patient states she will drop a copy of these labs.   Patient states a lot of time at night when she rolls to her right side she gets a burning sensation in her chest. She states it is not acid reflux. Her pcp wanted her to have an H pylori, she tried to have this done previously and was turned away by the lab tech, explained that we were previously having issues with the lab services but they have been resolved. Encouraged her to bring her lab slips if she wanted to have them all done at the same time!     ----- Message from Loel Dubonnet, NP sent at 09/06/2022  2:15 PM EST ----- Creatinine much lower than previous which means kidney function improved, but some concern for lab error. Continue Lasix 67m twice daily. Would recommend repeat BMP this week or next for monitoring.

## 2022-09-11 DIAGNOSIS — M5441 Lumbago with sciatica, right side: Secondary | ICD-10-CM | POA: Diagnosis not present

## 2022-09-11 DIAGNOSIS — M47816 Spondylosis without myelopathy or radiculopathy, lumbar region: Secondary | ICD-10-CM | POA: Diagnosis not present

## 2022-09-11 DIAGNOSIS — M9903 Segmental and somatic dysfunction of lumbar region: Secondary | ICD-10-CM | POA: Diagnosis not present

## 2022-09-12 ENCOUNTER — Ambulatory Visit (INDEPENDENT_AMBULATORY_CARE_PROVIDER_SITE_OTHER): Payer: PPO | Admitting: Cardiology

## 2022-09-12 ENCOUNTER — Encounter (HOSPITAL_BASED_OUTPATIENT_CLINIC_OR_DEPARTMENT_OTHER): Payer: Self-pay | Admitting: Cardiology

## 2022-09-12 VITALS — BP 107/71 | HR 86 | Ht 68.0 in | Wt 298.0 lb

## 2022-09-12 DIAGNOSIS — I5032 Chronic diastolic (congestive) heart failure: Secondary | ICD-10-CM | POA: Diagnosis not present

## 2022-09-12 DIAGNOSIS — I2510 Atherosclerotic heart disease of native coronary artery without angina pectoris: Secondary | ICD-10-CM

## 2022-09-12 DIAGNOSIS — I10 Essential (primary) hypertension: Secondary | ICD-10-CM | POA: Diagnosis not present

## 2022-09-12 DIAGNOSIS — E8881 Metabolic syndrome: Secondary | ICD-10-CM

## 2022-09-12 DIAGNOSIS — E785 Hyperlipidemia, unspecified: Secondary | ICD-10-CM

## 2022-09-12 DIAGNOSIS — E119 Type 2 diabetes mellitus without complications: Secondary | ICD-10-CM | POA: Diagnosis not present

## 2022-09-12 MED ORDER — SEMAGLUTIDE (1 MG/DOSE) 4 MG/3ML ~~LOC~~ SOPN
1.0000 mg | PEN_INJECTOR | SUBCUTANEOUS | 1 refills | Status: DC
Start: 2022-09-12 — End: 2022-11-24

## 2022-09-12 MED ORDER — EMPAGLIFLOZIN 10 MG PO TABS: 10.0000 mg | ORAL_TABLET | Freq: Every day | ORAL | 3 refills | Status: DC

## 2022-09-12 MED ORDER — ENTRESTO 24-26 MG PO TABS: 1.0000 | ORAL_TABLET | Freq: Two times a day (BID) | ORAL | 3 refills | Status: DC

## 2022-09-12 MED ORDER — SEMAGLUTIDE(0.25 OR 0.5MG/DOS) 2 MG/3ML ~~LOC~~ SOPN: 0.5000 mg | PEN_INJECTOR | SUBCUTANEOUS | 1 refills | Status: DC

## 2022-09-12 MED ORDER — SEMAGLUTIDE(0.25 OR 0.5MG/DOS) 2 MG/3ML ~~LOC~~ SOPN: PEN_INJECTOR | SUBCUTANEOUS | 1 refills | Status: DC

## 2022-09-12 NOTE — Progress Notes (Signed)
Cardiology Office Note:    Date:  09/12/2022   ID:  CESAR MUSACCHIA, DOB 1956-05-08, MRN [id number]  PCP:  Redmond School, MD  Cardiologist:  Buford Dresser, MD  Referring MD: Redmond School, MD   CC: general cardiology follow up  History of Present Illness:    Meagan Reyes is a 68 y.o. female with a hx of chronic diastolic heart failure, sinus bradycardia, nonobstructive CAD, aortic atherosclerosis, hypertension, hyperlipidemia, and GERD, who is seen for follow-up.   Cardiovascular  Comorbid conditions: Hypertension. Hyperlipidemia- Crestor was previously stopped due to achiness. Was trying essential oils to reduce cholesterol. Prior cardiac testing and/or incidental findings on other testing (ie coronary calcium): Prior Coronary CT 05/2019 revealed calcium score of 19. Previously [facility] to [facility]/20[facility]20 for SOB. CT was negative for PE or lung issues, and Echo showed normal LVEF. Exercise level: Her shortness of breath is very noticeable with activity such as bending over while doing laundry, and walking. She usually pushes through her shortness of breath while walking. They have a woodworking shop. Her husband will also watch her breathing and encourages her to sit down and rest when needed. Sometimes she feels short of breath with lying down. However, they have a bed that raises and they usually sleep on an incline.  At her last visit her breathing was stable. She complained of constant yet stable LE edema. She expressed concern that she would be transitioning to Medicare in the coming weeks. She was told that Jardiance and Delene Loll may be $800/month. She should be able to get to the end of this year, and then three months into next year, before hitting the donut hole. Although not ideal, may have to drop to valsartan instead of entresto and drop Jardiance.Also discussed recommendations and trial of another statin which she declined.  She followed up with Laurann Montana, NP 08/18/2022 and was noted to have exacerbation of chronic diastolic heart failure with weight gain of 6 pounds. Lasix was increased to 40 mg BID. Delene Loll was reduced to 24-26 mg BID due to lightheadedness and to prevent hypotension. Echocardiogram was updated 08/20/2022 revealing LVEF 55-60%, mild LVH, and mild dilatation of the ascending aorta measuring 40 mm.  Today, she is accompanied by her husband. She presents records of her labs 2 weeks ago at her annual physical, which are personally reviewed. Reportedly her most recent A1C is 5.6.  She notes that she was initially taking her Lasix incorrectly at 2 tablets in the morning (6:30 AM) and 1 tablet at lunch (11:30 AM - 1 PM), which was working well with greater urine production and relief of her symptoms. Currently she is back to taking 1 tablet of Lasix BID and she is feeling more short winded than before. When walking from her door to the shop area about 65-70 feet she is short of breath. After walking from the parking garage to the office today it felt like she had run a marathon. Yesterday she vacuumed, mopped, and ran a load of laundry throughout the day. Although it took a while she was able to complete these activities. She denies any wheezing.  Every once in a while she has discomfort/tenderness in her right neck. She also has occasional muscle spasms in her back with radiation around to her front. In the past she has taken her husband's flexeril which helped.  Also she has noticed rapid palpitations triggered by caffeine. She is investigating coffee alternatives.  She denies any lightheadedness, headaches, syncope,  orthopnea, or PND.   Past Medical History:  Diagnosis Date   Chronic diastolic heart failure ([place])    Complication of anesthesia    difficult to wake up after anesthesia   GERD (gastroesophageal reflux disease)    Hyperlipemia    Hypertension     Past Surgical History:  Procedure Laterality Date   ABDOMINAL  HYSTERECTOMY     BACK SURGERY     COLONOSCOPY WITH PROPOFOL N/A 12/09/2021   Procedure: COLONOSCOPY WITH PROPOFOL;  Surgeon: [name], DO;  Location: [facility];  Service: Endoscopy;  Laterality: N/A;  9:15am   HEEL SPUR EXCISION     KNEE ARTHROSCOPY WITH LATERAL MENISECTOMY Right 10/18/2015   Procedure: RIGHT KNEE ARTHROSCOPY WITH LATERAL MENISECTOMY;  Surgeon: [name], MD;  Location: [facility];  Service: Orthopedics;  Laterality: Right;   POLYPECTOMY  12/09/2021   Procedure: POLYPECTOMY INTESTINAL;  Surgeon: [name], DO;  Location: [facility];  Service: Endoscopy;;   TONSILLECTOMY      Current Medications: Current Outpatient Medications on File Prior to Visit  Medication Sig   amLODipine (NORVASC) 5 MG tablet TAKE 1 TABLET BY MOUTH DAILY   aspirin 81 MG chewable tablet Chew 81 mg by mouth at bedtime.    Cholecalciferol (VITAMIN D3) 50 MCG (2000 UT) capsule Take 2,000 Units by mouth daily at 6 (six) AM.   ENTRESTO 24-26 MG TAKE 1 TABLET BY MOUTH TWICE DAILY   Flaxseed, Linseed, (FLAX SEED OIL) 1000 MG CAPS Take 40 mg by mouth daily.   furosemide (LASIX) 40 MG tablet Take 1 tablet (40 mg total) by mouth 2 (two) times daily. Take 40 mg by mouth in the morning, may take an additional 40 mg in the afternoon if needed.   JARDIANCE 10 MG TABS tablet TAKE 1 TABLET BY MOUTH DAILY BEFORE BREAKFAST   meloxicam (MOBIC) 15 MG tablet Take 1 tablet by mouth once daily   Multiple Vitamin (MULTIVITAMIN WITH MINERALS) TABS tablet Take 2 tablets by mouth daily. Doterra Brand   potassium chloride SA (KLOR-CON M) 20 MEQ tablet TAKE 1 TABLET BY MOUTH DAILY   Specialty Vitamins Products (MAGNESIUM, AMINO ACID CHELATE,) 133 MG tablet Take 1 tablet by mouth daily.   spironolactone (ALDACTONE) 25 MG tablet TAKE 1 TABLET BY MOUTH DAILY   vitamin C (ASCORBIC ACID) 500 MG tablet Take 500 mg by mouth daily.   Zinc 15 MG CAPS Take 15 mg by mouth daily.   No current  facility-administered medications on file prior to visit.     Allergies:   Atorvastatin, Doxycycline, Rosuvastatin, and Neurontin [gabapentin]   Social History   Tobacco Use   Smoking status: Former    Packs/day: 0.10    Years: 0.25    Total pack years: 0.03    Types: Cigarettes    Start date: 07/1974    Quit date: 06/1975    Years since quitting: 62.2   Smokeless tobacco: Never   Tobacco comments:    a couple months when 46 then stopped  Vaping Use   Vaping Use: Never used  Substance Use Topics   Alcohol use: No   Drug use: No    Family History: family history includes Asthma in her mother; Atrial fibrillation in her mother; COPD in her mother; Cancer in her father; Hyperlipidemia in her brother, brother, and brother; Leukemia in her mother; Stroke in her mother. There is no history of Colon cancer or Colon polyps.  ROS:   Please see  the history of present illness. (+) BLE edema (+) Shortness of breath (+) Palpitations (+) Soreness of right neck (+) Muscle spasms of back All other systems are reviewed and negative.    EKGs/Labs/Other Studies Reviewed:    The following studies were reviewed today:  Echo  08/20/2022:  1. Left ventricular ejection fraction, by estimation, is 55 to 60%. The  left ventricle has normal function. The left ventricle has no regional  wall motion abnormalities. There is mild concentric left ventricular  hypertrophy. Left ventricular diastolic  parameters were normal. The average left ventricular global longitudinal  strain is -16.1 %. The global longitudinal strain is normal.   2. Right ventricular systolic function is normal. The right ventricular  size is normal. There is normal pulmonary artery systolic pressure.   3. The mitral valve is normal in structure. No evidence of mitral valve  regurgitation. No evidence of mitral stenosis.   4. The aortic valve is tricuspid. Aortic valve regurgitation is not  visualized. No aortic stenosis is  present.   5. Aortic dilatation noted. There is mild dilatation of the ascending  aorta, measuring 40 mm.   6. The inferior vena cava is normal in size with greater than 50%  respiratory variability, suggesting right atrial pressure of 3 mmHg.   Comparison(s): No significant change from prior study.   Conclusion(s)/Recommendation(s): Normal biventricular function without  evidence of hemodynamically significant valvular heart disease.   Echo  08/20/2021:  1. Left ventricular ejection fraction, by estimation, is 55 to 60%. Left  ventricular ejection fraction by 3D volume is 62 %. The left ventricle has  normal function. The left ventricle has no regional wall motion  abnormalities. Left ventricular diastolic   parameters are consistent with Grade II diastolic dysfunction  (pseudonormalization). Elevated left atrial pressure. The average left  ventricular global longitudinal strain is -16.6 %. The global longitudinal  strain is abnormal.   2. Right ventricular systolic function is normal. The right ventricular  size is normal.   3. Left atrial size was mildly dilated.   4. The mitral valve is normal in structure. No evidence of mitral valve  regurgitation. No evidence of mitral stenosis.   5. The aortic valve is normal in structure. Aortic valve regurgitation is  not visualized. No aortic stenosis is present.   6. There is mild dilatation of the ascending aorta, measuring 40 mm.   7. The inferior vena cava is normal in size with greater than 50%  respiratory variability, suggesting right atrial pressure of 3 mmHg.   Comparison(s): No significant change from prior study. Prior images  reviewed side by side. EF 65%, ascen aorta 57m.   CTA Chest 11/19/2020: FINDINGS: Cardiovascular: Satisfactory opacification of the pulmonary arteries to the segmental level. No evidence of pulmonary embolism. New mild cardiomegaly. No pericardial effusion.   Mediastinum/Nodes: No enlarged lymph  nodes. Thyroid and esophagus are unremarkable. Very small hiatal hernia.   Lungs/Pleura: No pleural effusion or pneumothorax. There is patchy bilateral ground-glass density.   Upper Abdomen: No acute abnormality.   Musculoskeletal: No acute osseous abnormality.   Review of the MIP images confirms the above findings.   IMPRESSION: No evidence acute pulmonary embolism.   Patchy bilateral ground-glass density. May reflect edema given new mild cardiomegaly.  Coronary CT 05/30/2019: FINDINGS: Non-cardiac: [facility]   Pulmonary veins drain normally to the left atrium.   Calcium Score: 1.9 WU   Coronary Arteries: Left dominant with no anomalies   LM:  No plaque or stenosis.   LAD system: No plaque or stenosis. Large high D1 covers ramus territory.   Circumflex system: Large, dominant LCx. Mixed plaque ostially with minimal stenosis.   RCA system: Small, nondominant RCA with no plaque or stenosis.   IMPRESSION: 1. Coronary artery calcium score 1.9 WU. This places the patient in the 60th percentile for age and gender, suggesting intermediate risk for future cardiac events.   2.  Nonobstructive mild CAD.  EKG:  EKG is personally reviewed.   09/12/2022:  EKG was not ordered. 03/27/2022:  EKG was not ordered. 12/26/2021: not ordered  Recent Labs: 08/18/2022: BNP 9.9; Hemoglobin 14.7; Platelets 276 09/05/2022: BUN 7; Creatinine, Ser 0.40; Potassium 4.2; Sodium 139   Recent Lipid Panel    Component Value Date/Time   CHOL 222 (H) 08/20/2021 1101   TRIG 235 (H) 08/20/2021 1101   HDL 42 08/20/2021 1101   CHOLHDL 5.3 (H) 08/20/2021 1101   CHOLHDL 6.1 11/20/2020 0517   VLDL UNABLE TO CALCULATE IF TRIGLYCERIDE OVER 400 mg/dL 11/20/2020 0517   LDLCALC 138 (H) 08/20/2021 1101   LDLDIRECT 120.2 (H) 11/20/2020 0517    Physical Exam:    VS:  BP 107/71   Pulse 86   Ht '5\' 8"'$  (1.727 m)   Wt 298 lb (135.2 kg)   BMI 45.31 kg/m     Wt Readings  from Last 3 Encounters:  09/12/22 298 lb (135.2 kg)  08/18/22 300 lb (136.1 kg)  03/27/22 294 lb 6.4 oz (133.5 kg)    GEN: Well nourished, well developed in no acute distress HEENT: Normal, moist mucous membranes NECK: No JVD CARDIAC: regular rhythm, normal S1 and S2, no rubs or gallops. No murmur. VASCULAR: Radial and DP pulses 2+ bilaterally. No carotid bruits RESPIRATORY:  Clear to auscultation without rales, wheezing or rhonchi  ABDOMEN: Soft, non-tender, non-distended MUSCULOSKELETAL:  Ambulates independently SKIN: Warm and dry, trivial bilateral LE edema NEUROLOGIC:  Alert and oriented x 3. No focal neuro deficits noted. PSYCHIATRIC:  Normal affect    ASSESSMENT:    1. Type 2 diabetes mellitus without complication, without long-term current use of insulin ([place])   2. Chronic heart failure with preserved ejection fraction ([place])   3. Chronic diastolic (congestive) heart failure ([place])   4. Nonocclusive coronary atherosclerosis of native coronary artery   5. Essential hypertension   6. Hyperlipidemia LDL goal <70   7. Metabolic syndrome     PLAN:    Chronic diastolic heart failure Dyspnea -feels somewhat more short of breath on lasix 40 mg BID, felt better on 80/40 mg daily. However, appears largely euvolemic today on exam -doing well on Jardiance 10 mg -continue entresto 24-46 mg BID and spironolactone 25 mg daily  Chronic LE edema, suspect component of chronic venous insufficiency -discussed compression, elevation -discussed amlodipine may contribute but isn't likely to be the primary issue  Hypertension: at goal -continue amlodipine 5 mg daily. Could decrease or stop amlodipine if BP similar at follow up -continue entresto, spironolactone as above  Hypercholesterolemia and hypertriglyceridemia Nonobstructive CAD based on CCTA -LDL goal <70, last 138 -had muscle aches on rosuvastatin -discussed recommendations, trial of another statin, alternatives. Declines at this  time -is on aspirin 81 mg daily -reviewed red flag warning signs that need immediate medical attention  BMI 45, obesity Type II diabetes (prior A1c 6.5) -this plus lipid pattern, hypertension, diabetes support metabolic syndrome -she was told her new insurance should cover GLP1. Given samples and instruction on this today. Semaglutide  ordered  OSA: uses her husband's CPAP, [facility]  Cardiac risk counseling and prevention recommendations: -recommend heart healthy/Mediterranean diet, with whole grains, fruits, vegetable, fish, lean meats, nuts, and olive oil. Limit salt. -recommend moderate walking, 3-5 times/week for 30-50 minutes each session. Aim for at least 150 minutes.week. Goal should be pace of 3 miles/hours, or walking 1.5 miles in 30 minutes -recommend avoidance of tobacco products. Avoid excess alcohol. -ASCVD risk score: The 10-year ASCVD risk score (Arnett DK, et al., 2019) is: 11.7%   Values used to calculate the score:     Age: 89 years     Sex: Female     Is Non-Hispanic African American: No     Diabetic: Yes     Tobacco smoker: No     Systolic Blood Pressure: [id number] mmHg     Is BP treated: Yes     HDL Cholesterol: 42 mg/dL     Total Cholesterol: 222 mg/dL    Plan for follow up: 2 months or sooner as needed.  Buford Dresser, MD, PhD, [facility]    Medication Adjustments/Labs and Tests Ordered: Current medicines are reviewed at length with the patient today.  Concerns regarding medicines are outlined above.   No orders of the defined types were placed in this encounter.  Meds ordered this encounter  Medications   Semaglutide,0.25 or 0.'5MG'$ /DOS, 2 MG/3ML SOPN    Sig: Inject 0.5 mg into the skin once a week.    Dispense:  3 mL    Refill:  1   Semaglutide, 1 MG/DOSE, 4 MG/3ML SOPN    Sig: Inject 1 mg into the skin once a week.    Dispense:  3 mL    Refill:  1   Semaglutide,0.25 or 0.'5MG'$ /DOS, 2 MG/3ML SOPN    Sig: As directed     Dispense:  3 mL    Refill:  1    Order Specific Question:   Lot Number?    Answer:   [id number]    Order Specific Question:   Expiration Date?    Answer:   01/18/2024   empagliflozin (JARDIANCE) 10 MG TABS tablet    Sig: Take 1 tablet (10 mg total) by mouth daily before breakfast.    Dispense:  90 tablet    Refill:  3    This prescription was filled on 06/03/2022. Any refills authorized will be placed on file.   sacubitril-valsartan (ENTRESTO) 24-26 MG    Sig: Take 1 tablet by mouth 2 (two) times daily.    Dispense:  180 tablet    Refill:  3    Entresto cannot be broken in half, can we get a new rx for the 24-'26mg'$ ? Thanks!   Patient Instructions  Medication Instructions:  Semaglutide starting dose plan: -Start with the 0.25 mg dose every week for 4 weeks, -Then use the 0.5 mg dose every week for 4 weeks, -Then continue with 1 mg dose every week. We will meet when you are on the 1 mg dose to discuss timing of uptitration.  *If you need a refill on your cardiac medications before your next appointment, please call your pharmacy*   Lab Work: None Ordered   Testing/Procedures: None Ordered   Follow-Up: At [facility], you and your health needs are our priority.  As part of our continuing mission to provide you with exceptional heart care, we have created designated Provider Care Teams.  These Care Teams include your primary Cardiologist (physician) and  Advanced Practice Providers (APPs -  Physician Assistants and Nurse Practitioners) who all work together to provide you with the care you need, when you need it.  We recommend signing up for the patient portal called "MyChart".  Sign up information is provided on this After Visit Summary.  MyChart is used to connect with patients for Virtual Visits (Telemedicine).  Patients are able to view lab/test results, encounter notes, upcoming appointments, etc.  Non-urgent messages can be sent to your provider as well.   To learn more  about what you can do with MyChart, go to [email or web address].    Your next appointment:    2 month(s)  Provider:   Buford Dresser, MD or Laurann Montana, NP  Other Instructions  Some nausea is normal, as is decreased appetite. If you have severe nausea or stomach pain, please contact us and stop the medication.   I,Mathew Stumpf,acting as a Education administrator for [employer], MD.,have documented all relevant documentation on the behalf of Buford Dresser, MD,as directed by  Buford Dresser, MD while in the presence of Buford Dresser, MD.  I, Buford Dresser, MD, have reviewed all documentation for this visit. The documentation on 09/12/22 for the exam, diagnosis, procedures, and orders are all accurate and complete.   Signed, Buford Dresser, MD PhD 09/12/2022     [place]

## 2022-09-12 NOTE — Patient Instructions (Addendum)
Medication Instructions:  Semaglutide starting dose plan: -Start with the 0.25 mg dose every week for 4 weeks, -Then use the 0.5 mg dose every week for 4 weeks, -Then continue with 1 mg dose every week. We will meet when you are on the 1 mg dose to discuss timing of uptitration.  *If you need a refill on your cardiac medications before your next appointment, please call your pharmacy*   Lab Work: None Ordered   Testing/Procedures: None Ordered   Follow-Up: At [facility], you and your health needs are our priority.  As part of our continuing mission to provide you with exceptional heart care, we have created designated Provider Care Teams.  These Care Teams include your primary Cardiologist (physician) and Advanced Practice Providers (APPs -  Physician Assistants and Nurse Practitioners) who all work together to provide you with the care you need, when you need it.  We recommend signing up for the patient portal called "MyChart".  Sign up information is provided on this After Visit Summary.  MyChart is used to connect with patients for Virtual Visits (Telemedicine).  Patients are able to view lab/test results, encounter notes, upcoming appointments, etc.  Non-urgent messages can be sent to your provider as well.   To learn more about what you can do with MyChart, go to [email or web address].    Your next appointment:    2 month(s)  Provider:   Buford Dresser, MD or Laurann Montana, NP  Other Instructions  Some nausea is normal, as is decreased appetite. If you have severe nausea or stomach pain, please contact us and stop the medication.

## 2022-09-18 ENCOUNTER — Encounter (HOSPITAL_BASED_OUTPATIENT_CLINIC_OR_DEPARTMENT_OTHER): Payer: Self-pay | Admitting: Cardiology

## 2022-09-18 ENCOUNTER — Telehealth (HOSPITAL_BASED_OUTPATIENT_CLINIC_OR_DEPARTMENT_OTHER): Payer: Self-pay | Admitting: *Deleted

## 2022-09-18 DIAGNOSIS — M9903 Segmental and somatic dysfunction of lumbar region: Secondary | ICD-10-CM | POA: Diagnosis not present

## 2022-09-18 DIAGNOSIS — M47816 Spondylosis without myelopathy or radiculopathy, lumbar region: Secondary | ICD-10-CM | POA: Diagnosis not present

## 2022-09-18 DIAGNOSIS — M5441 Lumbago with sciatica, right side: Secondary | ICD-10-CM | POA: Diagnosis not present

## 2022-09-18 NOTE — Telephone Encounter (Signed)
Faxed patient assistance for [employer], confirmation received

## 2022-09-19 ENCOUNTER — Encounter (HOSPITAL_BASED_OUTPATIENT_CLINIC_OR_DEPARTMENT_OTHER): Payer: Self-pay

## 2022-09-25 DIAGNOSIS — M47816 Spondylosis without myelopathy or radiculopathy, lumbar region: Secondary | ICD-10-CM | POA: Diagnosis not present

## 2022-09-25 DIAGNOSIS — M9903 Segmental and somatic dysfunction of lumbar region: Secondary | ICD-10-CM | POA: Diagnosis not present

## 2022-09-25 DIAGNOSIS — M5441 Lumbago with sciatica, right side: Secondary | ICD-10-CM | POA: Diagnosis not present

## 2022-09-26 NOTE — Progress Notes (Signed)
Cardiology Office Note:    Date:  09/26/2022   ID:  Meagan Reyes, DOB May 27, 1956, MRN [id number]  PCP:  Redmond School, MD  Cardiologist:  Buford Dresser, MD  Referring MD: Redmond School, MD   CC: general cardiology follow up  History of Present Illness:    Meagan Reyes is a 67 y.o. female with a hx of chronic diastolic heart failure, sinus bradycardia, nonobstructive CAD, aortic atherosclerosis, hypertension, hyperlipidemia, and GERD, who is seen for follow-up.   Cardiovascular  Comorbid conditions: Hypertension. Hyperlipidemia- Crestor was previously stopped due to achiness. Was trying essential oils to reduce cholesterol. Prior cardiac testing and/or incidental findings on other testing (ie coronary calcium): Prior Coronary CT 05/2019 revealed calcium score of 19. Previously admitted to [facility]/20/20 for SOB. CT was negative for PE or lung issues, and Echo showed normal LVEF. Exercise level: Her shortness of breath is very noticeable with activity such as bending over while doing laundry, and walking. She usually pushes through her shortness of breath while walking. They have a woodworking shop. Her husband will also watch her breathing and encourages her to sit down and rest when needed. Sometimes she feels short of breath with lying down. However, they have a bed that raises and they usually sleep on an incline.  She followed up with Laurann Montana, NP 08/18/2022 and was noted to have exacerbation of chronic diastolic heart failure with weight gain of 6 pounds. Lasix was increased to 40 mg BID. Delene Loll was reduced to 24-26 mg BID due to lightheadedness and to prevent hypotension. Echocardiogram was updated 08/20/2022 revealing LVEF 55-60%, mild LVH, and mild dilatation of the ascending aorta measuring 40 mm.  At her last appointment, she admitted to initially taking Lasix incorrectly at 2 tablets in the morning (6:30 AM) and 1 tablet at lunch (11:30 AM - 1 PM),  which was working well with greater urine production and relief of her symptoms. At one point she resumed taking 1 tablet of Lasix BID and was feeling more short of breath with increased exercise intolerance. On exam she appeared euvolemic. She noted occasional palpitations triggered by caffeine. She was investigating coffee alternatives. Her LDL was 138. We discussed recommendations, trial of another statin, alternatives. She declined starting new medications.  ***  Today,  She denies any palpitations, chest pain, shortness of breath, or peripheral edema. No lightheadedness, headaches, syncope, orthopnea, or PND.  (+)  Past Medical History:  Diagnosis Date   Chronic diastolic heart failure (HCC)    Complication of anesthesia    difficult to wake up after anesthesia   GERD (gastroesophageal reflux disease)    Hyperlipemia    Hypertension     Past Surgical History:  Procedure Laterality Date   ABDOMINAL HYSTERECTOMY     BACK SURGERY     COLONOSCOPY WITH PROPOFOL N/A 12/09/2021   Procedure: COLONOSCOPY WITH PROPOFOL;  Surgeon: [name], DO;  Location: [facility];  Service: Endoscopy;  Laterality: N/A;  9:15am   HEEL SPUR EXCISION     KNEE ARTHROSCOPY WITH LATERAL MENISECTOMY Right 10/18/2015   Procedure: RIGHT KNEE ARTHROSCOPY WITH LATERAL MENISECTOMY;  Surgeon: [name], MD;  Location: [facility];  Service: Orthopedics;  Laterality: Right;   POLYPECTOMY  12/09/2021   Procedure: POLYPECTOMY INTESTINAL;  Surgeon: [name], DO;  Location: [facility];  Service: Endoscopy;;   TONSILLECTOMY      Current Medications: Current Outpatient Medications on File Prior to Visit  Medication Sig  amLODipine (NORVASC) 5 MG tablet TAKE 1 TABLET BY MOUTH DAILY   aspirin 81 MG chewable tablet Chew 81 mg by mouth at bedtime.    Cholecalciferol (VITAMIN D3) 50 MCG (2000 UT) capsule Take 2,000 Units by mouth daily at 6 (six) AM.   empagliflozin (JARDIANCE) 10 MG TABS tablet  Take 1 tablet (10 mg total) by mouth daily before breakfast.   Flaxseed, Linseed, (FLAX SEED OIL) 1000 MG CAPS Take 40 mg by mouth daily.   furosemide (LASIX) 40 MG tablet Take 1 tablet (40 mg total) by mouth 2 (two) times daily. Take 40 mg by mouth in the morning, may take an additional 40 mg in the afternoon if needed.   meloxicam (MOBIC) 15 MG tablet Take 1 tablet by mouth once daily   Multiple Vitamin (MULTIVITAMIN WITH MINERALS) TABS tablet Take 2 tablets by mouth daily. Doterra Brand   potassium chloride SA (KLOR-CON M) 20 MEQ tablet TAKE 1 TABLET BY MOUTH DAILY   sacubitril-valsartan (ENTRESTO) 24-26 MG Take 1 tablet by mouth 2 (two) times daily.   Semaglutide, 1 MG/DOSE, 4 MG/3ML SOPN Inject 1 mg into the skin once a week.   Semaglutide,0.25 or 0.'5MG'$ /DOS, 2 MG/3ML SOPN Inject 0.5 mg into the skin once a week.   Semaglutide,0.25 or 0.'5MG'$ /DOS, 2 MG/3ML SOPN As directed   Specialty Vitamins Products (MAGNESIUM, AMINO ACID CHELATE,) 133 MG tablet Take 1 tablet by mouth daily.   spironolactone (ALDACTONE) 25 MG tablet TAKE 1 TABLET BY MOUTH DAILY   vitamin C (ASCORBIC ACID) 500 MG tablet Take 500 mg by mouth daily.   Zinc 15 MG CAPS Take 15 mg by mouth daily.   No current facility-administered medications on file prior to visit.     Allergies:   Atorvastatin, Doxycycline, Rosuvastatin, and Neurontin [gabapentin]   Social History   Tobacco Use   Smoking status: Former    Packs/day: 0.10    Years: 0.25    Total pack years: 0.03    Types: Cigarettes    Start date: 07/1974    Quit date: 06/1975    Years since quitting: 11.2   Smokeless tobacco: Never   Tobacco comments:    a couple months when 6 then stopped  Vaping Use   Vaping Use: Never used  Substance Use Topics   Alcohol use: No   Drug use: No    Family History: family history includes Asthma in her mother; Atrial fibrillation in her mother; COPD in her mother; Cancer in her father; Hyperlipidemia in her brother,  brother, and brother; Leukemia in her mother; Stroke in her mother. There is no history of Colon cancer or Colon polyps.  ROS:   Please see the history of present illness.  All other systems are reviewed and negative.    EKGs/Labs/Other Studies Reviewed:    The following studies were reviewed today:  Echo  08/20/2022:  1. Left ventricular ejection fraction, by estimation, is 55 to 60%. The  left ventricle has normal function. The left ventricle has no regional  wall motion abnormalities. There is mild concentric left ventricular  hypertrophy. Left ventricular diastolic  parameters were normal. The average left ventricular global longitudinal  strain is -16.1 %. The global longitudinal strain is normal.   2. Right ventricular systolic function is normal. The right ventricular  size is normal. There is normal pulmonary artery systolic pressure.   3. The mitral valve is normal in structure. No evidence of mitral valve  regurgitation. No evidence of mitral stenosis.  4. The aortic valve is tricuspid. Aortic valve regurgitation is not  visualized. No aortic stenosis is present.   5. Aortic dilatation noted. There is mild dilatation of the ascending  aorta, measuring 40 mm.   6. The inferior vena cava is normal in size with greater than 50%  respiratory variability, suggesting right atrial pressure of 3 mmHg.   Comparison(s): No significant change from prior study.   Conclusion(s)/Recommendation(s): Normal biventricular function without  evidence of hemodynamically significant valvular heart disease.   Echo  08/20/2021:  1. Left ventricular ejection fraction, by estimation, is 55 to 60%. Left  ventricular ejection fraction by 3D volume is 62 %. The left ventricle has  normal function. The left ventricle has no regional wall motion  abnormalities. Left ventricular diastolic   parameters are consistent with Grade II diastolic dysfunction  (pseudonormalization). Elevated left atrial  pressure. The average left  ventricular global longitudinal strain is -16.6 %. The global longitudinal  strain is abnormal.   2. Right ventricular systolic function is normal. The right ventricular  size is normal.   3. Left atrial size was mildly dilated.   4. The mitral valve is normal in structure. No evidence of mitral valve  regurgitation. No evidence of mitral stenosis.   5. The aortic valve is normal in structure. Aortic valve regurgitation is  not visualized. No aortic stenosis is present.   6. There is mild dilatation of the ascending aorta, measuring 40 mm.   7. The inferior vena cava is normal in size with greater than 50%  respiratory variability, suggesting right atrial pressure of 3 mmHg.   Comparison(s): No significant change from prior study. Prior images  reviewed side by side. EF 65%, ascen aorta 75m.   CTA Chest 11/19/2020: FINDINGS: Cardiovascular: Satisfactory opacification of the pulmonary arteries to the segmental level. No evidence of pulmonary embolism. New mild cardiomegaly. No pericardial effusion.   Mediastinum/Nodes: No enlarged lymph nodes. Thyroid and esophagus are unremarkable. Very small hiatal hernia.   Lungs/Pleura: No pleural effusion or pneumothorax. There is patchy bilateral ground-glass density.   Upper Abdomen: No acute abnormality.   Musculoskeletal: No acute osseous abnormality.   Review of the MIP images confirms the above findings.   IMPRESSION: No evidence acute pulmonary embolism.   Patchy bilateral ground-glass density. May reflect edema given new mild cardiomegaly.  Coronary CT 05/30/2019: FINDINGS: [facility]cardiac: [facility]   Pulmonary veins drain normally to the left atrium.   Calcium Score: 1.9 WU   Coronary Arteries: Left dominant with no anomalies   LM: No plaque or stenosis.   LAD system: No plaque or stenosis. Large high D1 covers ramus territory.   Circumflex system: Large,  dominant LCx. Mixed plaque ostially with minimal stenosis.   RCA system: Small, nondominant RCA with no plaque or stenosis.   IMPRESSION: 1. Coronary artery calcium score 1.9 WU. This places the patient in the 60th percentile for age and gender, suggesting intermediate risk for future cardiac events.   2.  Nonobstructive mild CAD.  EKG:  EKG is personally reviewed.   09/29/2022:  *** 09/12/2022:  EKG was not ordered. 03/27/2022:  EKG was not ordered. 12/26/2021: not ordered  Recent Labs: 08/18/2022: BNP 9.9; Hemoglobin 14.7; Platelets 276 09/05/2022: BUN 7; Creatinine, Ser 0.40; Potassium 4.2; Sodium 139   Recent Lipid Panel    Component Value Date/Time   CHOL 222 (H) 08/20/2021 1101   TRIG 235 (H) 08/20/2021 1101   HDL 42 08/20/2021 1101  CHOLHDL 5.3 (H) 08/20/2021 1101   CHOLHDL 6.1 11/20/2020 0517   VLDL UNABLE TO CALCULATE IF TRIGLYCERIDE OVER 400 mg/dL 11/20/2020 0517   LDLCALC 138 (H) 08/20/2021 1101   LDLDIRECT 120.2 (H) 11/20/2020 0517    Physical Exam:    VS:  There were no vitals taken for this visit.    Wt Readings from Last 3 Encounters:  09/12/22 298 lb (135.2 kg)  08/18/22 300 lb (136.1 kg)  03/27/22 294 lb 6.4 oz (133.5 kg)    GEN: Well nourished, well developed in no acute distress HEENT: Normal, moist mucous membranes NECK: No JVD CARDIAC: regular rhythm, normal S1 and S2, no rubs or gallops. No murmur. VASCULAR: Radial and DP pulses 2+ bilaterally. No carotid bruits RESPIRATORY:  Clear to auscultation without rales, wheezing or rhonchi  ABDOMEN: Soft, non-tender, non-distended MUSCULOSKELETAL:  Ambulates independently SKIN: Warm and dry, ***trivial bilateral LE edema NEUROLOGIC:  Alert and oriented x 3. No focal neuro deficits noted. PSYCHIATRIC:  Normal affect    ASSESSMENT:    No diagnosis found.   PLAN:    Chronic diastolic heart failure Dyspnea -feels somewhat more short of breath on lasix 40 mg BID, felt better on 80/40 mg daily.  However, appears largely euvolemic today on exam -doing well on Jardiance 10 mg -continue entresto 24-46 mg BID and spironolactone 25 mg daily  Chronic LE edema, suspect component of chronic venous insufficiency -discussed compression, elevation -discussed amlodipine may contribute but isn't likely to be the primary issue  Hypertension: at goal -continue amlodipine 5 mg daily. Could decrease or stop amlodipine if BP similar at follow up -continue entresto, spironolactone as above  Hypercholesterolemia and hypertriglyceridemia Nonobstructive CAD based on CCTA -LDL goal <70, last 138 -had muscle aches on rosuvastatin -discussed recommendations, trial of another statin, alternatives. Declines at this time -is on aspirin 81 mg daily -reviewed red flag warning signs that need immediate medical attention  BMI 45, obesity Type II diabetes (prior A1c 6.5) -this plus lipid pattern, hypertension, diabetes support metabolic syndrome -she was told her new insurance should cover GLP1. Given samples and instruction on this today. Semaglutide ordered  OSA: uses her husband's CPAP, [facility]  Cardiac risk counseling and prevention recommendations: -recommend heart healthy/Mediterranean diet, with whole grains, fruits, vegetable, fish, lean meats, nuts, and olive oil. Limit salt. -recommend moderate walking, 3-5 times/week for 30-50 minutes each session. Aim for at least 150 minutes.week. Goal should be pace of 3 miles/hours, or walking 1.5 miles in 30 minutes -recommend avoidance of tobacco products. Avoid excess alcohol. -ASCVD risk score: The 10-year ASCVD risk score (Arnett DK, et al., 2019) is: 11.7%   Values used to calculate the score:     Age: 47 years     Sex: Female     Is Non-Hispanic African American: No     Diabetic: Yes     Tobacco smoker: No     Systolic Blood Pressure: [id number] mmHg     Is BP treated: Yes     HDL Cholesterol: 42 mg/dL     Total Cholesterol: 222 mg/dL    Plan  for follow up: ***2 months or sooner as needed.  Buford Dresser, MD, PhD, [facility]    Medication Adjustments/Labs and Tests Ordered: Current medicines are reviewed at length with the patient today.  Concerns regarding medicines are outlined above.   No orders of the defined types were placed in this encounter.  No orders of the defined types were  placed in this encounter.  There are no Patient Instructions on file for this visit.   I,Mathew Stumpf,acting as a Education administrator for [employer], MD.,have documented all relevant documentation on the behalf of Buford Dresser, MD,as directed by  Buford Dresser, MD while in the presence of Buford Dresser, MD.  I, Madelin Rear, have reviewed all documentation for this visit. The documentation on 09/26/22 for the exam, diagnosis, procedures, and orders are all accurate and complete.   Signed, Buford Dresser, MD PhD 09/26/2022     [place]

## 2022-09-29 ENCOUNTER — Encounter (HOSPITAL_BASED_OUTPATIENT_CLINIC_OR_DEPARTMENT_OTHER): Payer: Self-pay | Admitting: Cardiology

## 2022-09-29 ENCOUNTER — Ambulatory Visit (INDEPENDENT_AMBULATORY_CARE_PROVIDER_SITE_OTHER): Payer: PPO | Admitting: Cardiology

## 2022-09-29 VITALS — BP 132/80 | HR 70 | Ht 68.0 in | Wt 304.0 lb

## 2022-09-29 DIAGNOSIS — I2510 Atherosclerotic heart disease of native coronary artery without angina pectoris: Secondary | ICD-10-CM

## 2022-09-29 DIAGNOSIS — E119 Type 2 diabetes mellitus without complications: Secondary | ICD-10-CM

## 2022-09-29 DIAGNOSIS — I10 Essential (primary) hypertension: Secondary | ICD-10-CM | POA: Diagnosis not present

## 2022-09-29 DIAGNOSIS — I872 Venous insufficiency (chronic) (peripheral): Secondary | ICD-10-CM

## 2022-09-29 DIAGNOSIS — R0609 Other forms of dyspnea: Secondary | ICD-10-CM

## 2022-09-29 NOTE — Patient Instructions (Addendum)
Medication Instructions:  Your physician recommends that you continue on your current medications as directed. Please refer to the Current Medication list given to you today.  *If you need a refill on your cardiac medications before your next appointment, please call your pharmacy*  Lab Work: NONE  Testing/Procedures: Your physician has recommended that you have a cardiopulmonary stress test (CPX). CPX testing is a non-invasive measurement of heart and lung function. It replaces a traditional treadmill stress test. This type of test provides a tremendous amount of information that relates not only to your present condition but also for future outcomes. This test combines measurements of you ventilation, respiratory gas exchange in the lungs, electrocardiogram (EKG), blood pressure and physical response before, during, and following an exercise protocol.  Follow-Up: Keep your May appointment to discuss CPX

## 2022-10-02 DIAGNOSIS — M5441 Lumbago with sciatica, right side: Secondary | ICD-10-CM | POA: Diagnosis not present

## 2022-10-02 DIAGNOSIS — M9903 Segmental and somatic dysfunction of lumbar region: Secondary | ICD-10-CM | POA: Diagnosis not present

## 2022-10-02 DIAGNOSIS — M47816 Spondylosis without myelopathy or radiculopathy, lumbar region: Secondary | ICD-10-CM | POA: Diagnosis not present

## 2022-10-03 ENCOUNTER — Telehealth: Payer: Self-pay

## 2022-10-03 ENCOUNTER — Other Ambulatory Visit (HOSPITAL_COMMUNITY): Payer: Self-pay

## 2022-10-03 DIAGNOSIS — E119 Type 2 diabetes mellitus without complications: Secondary | ICD-10-CM

## 2022-10-03 NOTE — Telephone Encounter (Signed)
Received notification both approved

## 2022-10-03 NOTE — Telephone Encounter (Signed)
-----   Message from [place], CPhT sent at 10/03/2022  3:31 PM EDT ----- P/a for Ozempic (0.25 or 0.5 MG/DOSE) 2MG /3ML is Approved. Effective dates 3.15.24-3.15.25 ----- Message ----- From: Maralyn Sago, CPhT Sent: 10/03/2022  10:22 AM EDT To: Earvin Hansen, LPN; Rx Prior Auth Team  P/a for Ozempic (0.25 or 0.5 MG/DOSE) 2MG /3ML pen-injectors is now ''PENDING''  New key ; [id number]   ----- Message ----- From: Earvin Hansen, LPN Sent: [id number]   5:34 PM EDT To: Rx Prior Auth Team  Hello all  Patient needing PA   Meagan Reyes (Key: [id number]) Rx #[id number] Ozempic (0.25 or 0.5 MG/DOSE) 2MG Fayne Mediate pen-injectors  Thank you   Ernestina Patches all are amazing!!! Thank you so much for all you do

## 2022-10-06 NOTE — Telephone Encounter (Signed)
Pharmacy Patient Advocate Encounter  Prior Authorization for Ozempic (0.25 or 0.5 MG/DOSE) 2MG /3ML  has been approved by [facility] (ins).    KEY # [id number]  Effective dates: 3.15.24 through 3.15.25

## 2022-10-07 NOTE — Telephone Encounter (Signed)
Pt called in stating they will not let her complete her portion without provider email. Please advise.

## 2022-10-07 NOTE — Telephone Encounter (Signed)
Will not be able to do PAP online as we do not utilize email for these processes. She can download the paper form, fill out her portion, and mail in /drop off. Alternatively we can print and mail to her if that is easier.   Loel Dubonnet, NP

## 2022-10-08 DIAGNOSIS — M9903 Segmental and somatic dysfunction of lumbar region: Secondary | ICD-10-CM | POA: Diagnosis not present

## 2022-10-08 DIAGNOSIS — M5441 Lumbago with sciatica, right side: Secondary | ICD-10-CM | POA: Diagnosis not present

## 2022-10-08 DIAGNOSIS — M47816 Spondylosis without myelopathy or radiculopathy, lumbar region: Secondary | ICD-10-CM | POA: Diagnosis not present

## 2022-10-21 DIAGNOSIS — M5441 Lumbago with sciatica, right side: Secondary | ICD-10-CM | POA: Diagnosis not present

## 2022-10-21 DIAGNOSIS — M47816 Spondylosis without myelopathy or radiculopathy, lumbar region: Secondary | ICD-10-CM | POA: Diagnosis not present

## 2022-10-21 DIAGNOSIS — M9903 Segmental and somatic dysfunction of lumbar region: Secondary | ICD-10-CM | POA: Diagnosis not present

## 2022-10-27 ENCOUNTER — Ambulatory Visit (HOSPITAL_COMMUNITY): Payer: PPO

## 2022-10-27 ENCOUNTER — Encounter (HOSPITAL_COMMUNITY): Payer: Self-pay

## 2022-10-28 NOTE — Telephone Encounter (Signed)
Patient dropped off her completed portion 10/28/22 @ 3:27pm. Left in Dr Di Kindle box to be filled out.

## 2022-10-29 DIAGNOSIS — M9903 Segmental and somatic dysfunction of lumbar region: Secondary | ICD-10-CM | POA: Diagnosis not present

## 2022-10-29 DIAGNOSIS — M47816 Spondylosis without myelopathy or radiculopathy, lumbar region: Secondary | ICD-10-CM | POA: Diagnosis not present

## 2022-10-29 DIAGNOSIS — M5441 Lumbago with sciatica, right side: Secondary | ICD-10-CM | POA: Diagnosis not present

## 2022-11-05 DIAGNOSIS — M47816 Spondylosis without myelopathy or radiculopathy, lumbar region: Secondary | ICD-10-CM | POA: Diagnosis not present

## 2022-11-05 DIAGNOSIS — M5441 Lumbago with sciatica, right side: Secondary | ICD-10-CM | POA: Diagnosis not present

## 2022-11-05 DIAGNOSIS — M9903 Segmental and somatic dysfunction of lumbar region: Secondary | ICD-10-CM | POA: Diagnosis not present

## 2022-11-07 MED ORDER — SEMAGLUTIDE(0.25 OR 0.5MG/DOS) 2 MG/3ML ~~LOC~~ SOPN: 0.5000 mg | PEN_INJECTOR | SUBCUTANEOUS | 1 refills | Status: DC

## 2022-11-07 NOTE — Addendum Note (Signed)
Addended by: Marlene Lard on: 11/07/2022 04:17 PM   Modules accepted: Orders

## 2022-11-12 DIAGNOSIS — M5441 Lumbago with sciatica, right side: Secondary | ICD-10-CM | POA: Diagnosis not present

## 2022-11-12 DIAGNOSIS — M9903 Segmental and somatic dysfunction of lumbar region: Secondary | ICD-10-CM | POA: Diagnosis not present

## 2022-11-12 DIAGNOSIS — M47816 Spondylosis without myelopathy or radiculopathy, lumbar region: Secondary | ICD-10-CM | POA: Diagnosis not present

## 2022-11-13 ENCOUNTER — Ambulatory Visit (HOSPITAL_COMMUNITY): Payer: PPO | Attending: Cardiology

## 2022-11-13 DIAGNOSIS — R0600 Dyspnea, unspecified: Secondary | ICD-10-CM | POA: Diagnosis not present

## 2022-11-19 ENCOUNTER — Ambulatory Visit (HOSPITAL_BASED_OUTPATIENT_CLINIC_OR_DEPARTMENT_OTHER): Payer: PPO | Admitting: Cardiology

## 2022-11-20 ENCOUNTER — Encounter (HOSPITAL_BASED_OUTPATIENT_CLINIC_OR_DEPARTMENT_OTHER): Payer: Self-pay | Admitting: Family

## 2022-11-20 ENCOUNTER — Ambulatory Visit (INDEPENDENT_AMBULATORY_CARE_PROVIDER_SITE_OTHER): Payer: PPO | Admitting: Family

## 2022-11-20 VITALS — BP 116/78 | HR 59 | Ht 68.0 in | Wt 304.3 lb

## 2022-11-20 DIAGNOSIS — I2510 Atherosclerotic heart disease of native coronary artery without angina pectoris: Secondary | ICD-10-CM | POA: Diagnosis not present

## 2022-11-20 DIAGNOSIS — I872 Venous insufficiency (chronic) (peripheral): Secondary | ICD-10-CM | POA: Diagnosis not present

## 2022-11-20 DIAGNOSIS — I5032 Chronic diastolic (congestive) heart failure: Secondary | ICD-10-CM | POA: Diagnosis not present

## 2022-11-20 DIAGNOSIS — R0683 Snoring: Secondary | ICD-10-CM

## 2022-11-20 DIAGNOSIS — I10 Essential (primary) hypertension: Secondary | ICD-10-CM | POA: Diagnosis not present

## 2022-11-20 DIAGNOSIS — G4730 Sleep apnea, unspecified: Secondary | ICD-10-CM | POA: Diagnosis not present

## 2022-11-20 DIAGNOSIS — E785 Hyperlipidemia, unspecified: Secondary | ICD-10-CM | POA: Diagnosis not present

## 2022-11-20 NOTE — Progress Notes (Signed)
Office Visit    Patient Name: Meagan Reyes Date of Encounter: 11/20/2022  PCP:  Elfredia Nevins, MD   [facility]  Cardiologist:  Jodelle Red, MD  Advanced Practice Provider:  No care team member to display Electrophysiologist:  None   Chief Complaint    Meagan Reyes is a 67 y.o. female presents today for follow up after CPX  Past Medical History    Past Medical History:  Diagnosis Date   Chronic diastolic heart failure (HCC)    Complication of anesthesia    difficult to wake up after anesthesia   GERD (gastroesophageal reflux disease)    Hyperlipemia    Hypertension    Past Surgical History:  Procedure Laterality Date   ABDOMINAL HYSTERECTOMY     BACK SURGERY     COLONOSCOPY WITH PROPOFOL N/A 12/09/2021   Procedure: COLONOSCOPY WITH PROPOFOL;  Surgeon: Lanelle Bal, DO;  Location: [facility];  Service: Endoscopy;  Laterality: N/A;  9:15am   HEEL SPUR EXCISION     KNEE ARTHROSCOPY WITH LATERAL MENISECTOMY Right 10/18/2015   Procedure: RIGHT KNEE ARTHROSCOPY WITH LATERAL MENISECTOMY;  Surgeon: Vickki Hearing, MD;  Location: [facility] ORS;  Service: Orthopedics;  Laterality: Right;   POLYPECTOMY  12/09/2021   Procedure: POLYPECTOMY INTESTINAL;  Surgeon: Lanelle Bal, DO;  Location: [facility];  Service: Endoscopy;;   TONSILLECTOMY      Allergies  Allergies  Allergen Reactions   Atorvastatin Other (See Comments)    Myalgia; pt reports this occurred in past, was prescribed by different doctor.   Doxycycline     Shortness or breath one time another time had symptoms of heart attack   Rosuvastatin     Joint pain on 5mg  daily   Neurontin [Gabapentin] Rash    History of Present Illness    Meagan Reyes is a 67 y.o. female with a hx of chronic diastolic heart failure, sinus bradycardia, nonobstructive coronary artery disease, aortic atherosclerosis, hypertension, hyperlipidemia, GERD last seen 09/29/22  Cardiac CTA  05/2019 calcium score of 19. 11/19/2020 to [facility] for shortness of breath.  CTA negative for PE.  Echo with normal LVEF. Most recent echo 08/20/21 LVEF 60%, no RWMA, gr2DD, elevated LA pressure, mild dilation ascending arota 40mm. Crestor previously stopped due to achiness. She has declined alternate statin.   Seen by Dr. Cristal Deer 9/ 7/23. She was doing overall well and no changes made.  At visit 08/18/2022 noted to have exacerbation of chronic diastolic heart failure with 6 pound weight gain.  Lasix increased to 40 mg twice daily.  Entresto reduced to 24-26 mg twice daily due to lightheadedness and prevent hypotension.  Echo 08/20/2022 LVEF 55 to 60%, mild LVH, mild dilation ascending aorta 40 mm.  Seen 09/12/2022 with Lasix adjusted as she had been taking incorrectly at home.  LDL of 138 and she declined trying another statin or alternatives.  Last seen 09/29/22 given exertional dyspnea CPX performed with no limitations due to heart nor lungs.  Presents today for follow up independently. 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 We reviewed CPX testing.  She is working to be more active. Reports her dyspnea on exertion is improving since last visit. Reports no chest pain, pressure, or tightness. No edema, orthopnea, PND. Reports no palpitations.    Discussed sleep study.  She has had no prior sleep study but previously slept with her  husband CPAP with improvement in her sleep, snoring.  Notes that CPAP is broken she is interested in pursuing sleep study.  Has upcoming visit with ENT as well to discuss nasal congestion.  Notes vision change since ozempic particularly in her right eye per her report. She does have visit upcoming with her ophthalmologist. Discussed this would be atypical side effect as no prior diagnosis of diabetic retinopathy. She previously had difficulty with 0.5mg  and has been encouraged to trial again prior to completing PAP.  Has taken 2 doses of the 0.5mg  pen.   EKGs/Labs/Other Studies Reviewed:   The following studies were reviewed today: Cardiac Studies & Procedures       ECHOCARDIOGRAM  ECHOCARDIOGRAM COMPLETE 08/20/2022  Narrative ECHOCARDIOGRAM REPORT    Patient Name:   Meagan Reyes Date of Exam: 08/20/2022 Medical Rec #:  [id number]       Height:       68.0 in Accession #:    [id number]      Weight:       300.0 lb Date of Birth:  1956/02/22       BSA:          2.428 m Patient Age:    66 years        BP:           120/72 mmHg Patient Gender: F               HR:           46 bpm. Exam Location:  Outpatient  Procedure: 2D Echo, 3D Echo, Cardiac Doppler, Color Doppler and Strain Analysis  Indications:    I50.9* Heart failure (unspecified)  History:        Patient has prior history of Echocardiogram examinations, most recent 08/20/2021. Arrythmias:Bradycardia, Signs/Symptoms:Shortness of Breath; Risk Factors:Hypertension, Former Smoker and Dyslipidemia. GERD.  Sonographer:    Jeryl Columbia RDCS Referring Phys: [id number] Dequon Schnebly S Telsa Dillavou  IMPRESSIONS   1. Left ventricular ejection fraction, by estimation, is 55 to 60%. The left ventricle has normal function. The left ventricle has no regional wall motion abnormalities. There is mild concentric left ventricular hypertrophy. Left ventricular diastolic parameters were normal. The average left ventricular global longitudinal strain is -16.1 %. The global longitudinal strain is normal. 2. Right ventricular systolic function is normal. The right ventricular size is normal. There is normal pulmonary artery systolic pressure. 3. The mitral valve is normal in structure. No evidence of mitral valve regurgitation. No evidence of mitral stenosis. 4. The aortic valve is tricuspid. Aortic valve regurgitation is not visualized. No aortic stenosis is present. 5. Aortic dilatation noted. There is mild dilatation of the ascending aorta, measuring 40 mm. 6. The  inferior vena cava is normal in size with greater than 50% respiratory variability, suggesting right atrial pressure of 3 mmHg.  Comparison(s): No significant change from prior study.  Conclusion(s)/Recommendation(s): Normal biventricular function without evidence of hemodynamically significant valvular heart disease.  FINDINGS Left Ventricle: Left ventricular ejection fraction, by estimation, is 55 to 60%. The left ventricle has normal function. The left ventricle has no regional wall motion abnormalities. The average left ventricular global longitudinal strain is -16.1 %. The global longitudinal strain is normal. The left ventricular internal cavity size was normal in size. There is mild concentric left ventricular hypertrophy. Left ventricular diastolic parameters were normal.  Right Ventricle: The right ventricular size is normal. No increase in right ventricular wall thickness. Right ventricular systolic function is normal. There is normal pulmonary artery  systolic pressure. The tricuspid regurgitant velocity is 1.88 m/s, and with an assumed right atrial pressure of 3 mmHg, the estimated right ventricular systolic pressure is 17.1 mmHg.  Left Atrium: Left atrial size was normal in size.  Right Atrium: Right atrial size was normal in size.  Pericardium: Trivial pericardial effusion is present. Presence of epicardial fat layer.  Mitral Valve: The mitral valve is normal in structure. No evidence of mitral valve regurgitation. No evidence of mitral valve stenosis.  Tricuspid Valve: The tricuspid valve is normal in structure. Tricuspid valve regurgitation is trivial. No evidence of tricuspid stenosis.  Aortic Valve: The aortic valve is tricuspid. Aortic valve regurgitation is not visualized. No aortic stenosis is present.  Pulmonic Valve: The pulmonic valve was grossly normal. Pulmonic valve regurgitation is not visualized. No evidence of pulmonic stenosis.  Aorta: Aortic dilatation noted.  There is mild dilatation of the ascending aorta, measuring 40 mm.  Venous: The inferior vena cava is normal in size with greater than 50% respiratory variability, suggesting right atrial pressure of 3 mmHg.  IAS/Shunts: The atrial septum is grossly normal.   LEFT VENTRICLE PLAX 2D LVIDd:         3.90 cm   Diastology LVIDs:         2.62 cm   LV e' medial:    5.22 cm/s LV PW:         1.40 cm   LV E/e' medial:  13.9 LV IVS:        1.31 cm   LV e' lateral:   6.42 cm/s LVOT diam:     2.00 cm   LV E/e' lateral: 11.3 LV SV:         56 LV SV Index:   23        2D Longitudinal Strain LVOT Area:     3.14 cm  2D Strain GLS (A2C):   -15.9 % 2D Strain GLS (A3C):   -15.4 % 2D Strain GLS (A4C):   -17.1 % 2D Strain GLS Avg:     -16.1 %  3D Volume EF: 3D EF:        59 % LV EDV:       132 ml LV ESV:       54 ml LV SV:        77 ml  RIGHT VENTRICLE RV Basal diam:  3.77 cm RV Mid diam:    2.71 cm RV S prime:     14.40 cm/s TAPSE (M-mode): 3.3 cm  LEFT ATRIUM             Index        RIGHT ATRIUM          Index LA diam:        4.10 cm 1.69 cm/m   RA Area:     7.32 cm LA Vol (A2C):   30.4 ml 12.52 ml/m  RA Volume:   11.80 ml 4.86 ml/m LA Vol (A4C):   20.9 ml 8.61 ml/m LA Biplane Vol: 26.0 ml 10.71 ml/m AORTIC VALVE LVOT Vmax:   74.90 cm/s LVOT Vmean:  49.300 cm/s LVOT VTI:    0.178 m  AORTA Ao Root diam: 3.10 cm Ao Asc diam:  4.00 cm  MITRAL VALVE               TRICUSPID VALVE MV Area (PHT): 3.48 cm    TR Peak grad:   14.1 mmHg MV Decel Time: 218 msec    TR Vmax:  188.00 cm/s MV E velocity: 72.30 cm/s MV A velocity: 97.50 cm/s  SHUNTS MV E/A ratio:  0.74        Systemic VTI:  0.18 m Systemic Diam: 2.00 cm  Jodelle Red MD Electronically signed by Jodelle Red MD Signature Date/Time: 08/20/2022/4:02:28 PM    Final     CT SCANS  CT CORONARY MORPH W/CTA COR W/SCORE 05/30/2019  Addendum 05/30/2019  5:40 PM ADDENDUM REPORT: 05/30/2019  17:38  CLINICAL DATA:  Chest pain  EXAM: Cardiac CTA  MEDICATIONS: Sub lingual nitro. 4mg  x 2  TECHNIQUE: The patient was scanned on a Siemens 192 slice scanner. Gantry rotation speed was 250 msecs. Collimation was 0.6 mm. A 100 kV prospective scan was triggered in the ascending thoracic aorta at 35-75% of the R-R interval. Average HR during the scan was 60 bpm. The 3D data set was interpreted on a dedicated work station using MPR, MIP and VRT modes. A total of 80cc of contrast was used.  FINDINGS: Non-cardiac: See separate report from [facility].  Pulmonary veins drain normally to the left atrium.  Calcium Score: 1.9 WU  Coronary Arteries: Left dominant with no anomalies  LM: No plaque or stenosis.  LAD system: No plaque or stenosis. Large high D1 covers ramus territory.  Circumflex system: Large, dominant LCx. Mixed plaque ostially with minimal stenosis.  RCA system: Small, nondominant RCA with no plaque or stenosis.  IMPRESSION: 1. Coronary artery calcium score 1.9 WU. This places the patient in the 60th percentile for age and gender, suggesting intermediate risk for future cardiac events.  2.  Nonobstructive mild CAD.  Dalton Mclean   Electronically Signed By: Marca Ancona M.D. On: 05/30/2019 17:38  Narrative EXAM: OVER-READ INTERPRETATION  CT CHEST  The following report is an over-read performed by radiologist Dr. Jeronimo Greaves of [facility] on 05/30/2019. This over-read does not include interpretation of cardiac or coronary anatomy or pathology. The coronary CTA interpretation by the cardiologist is attached.  COMPARISON:  03/25/2019 CTA chest  FINDINGS: Vascular: Normal aortic caliber, without dissection. Aortic atherosclerosis. No central pulmonary embolism, on this non-dedicated study.  Mediastinum/Nodes: No imaged thoracic adenopathy. Tiny hiatal hernia.  Lungs/Pleura: No imaged pleural fluid.  Clear imaged  lungs.  Upper Abdomen: Normal imaged portions of the liver, spleen.  Musculoskeletal: No acute osseous abnormality.  IMPRESSION: 1.  No acute findings in the imaged extracardiac chest. 2.  Tiny hiatal hernia. 3.  Aortic Atherosclerosis (ICD10-I70.0).  Electronically Signed: By: Jeronimo Greaves M.D. On: 05/30/2019 09:03           EKG:  EKG is not ordered today.   Recent Labs: 08/18/2022: BNP 9.9; Hemoglobin 14.7; Platelets 276 09/05/2022: BUN 7; Creatinine, Ser 0.40; Potassium 4.2; Sodium 139  Recent Lipid Panel    Component Value Date/Time   CHOL 222 (H) 08/20/2021 1101   TRIG 235 (H) 08/20/2021 1101   HDL 42 08/20/2021 1101   CHOLHDL 5.3 (H) 08/20/2021 1101   CHOLHDL 6.1 11/20/2020 0517   VLDL UNABLE TO CALCULATE IF TRIGLYCERIDE OVER 400 mg/dL 40/98/1191 4782   LDLCALC 138 (H) 08/20/2021 1101   LDLDIRECT 120.2 (H) 11/20/2020 0517   Home Medications   Current Meds  Medication Sig   amLODipine (NORVASC) 5 MG tablet TAKE 1 TABLET BY MOUTH DAILY   aspirin 81 MG chewable tablet Chew 81 mg by mouth at bedtime.    Cholecalciferol (VITAMIN D3) 50 MCG (2000 UT) capsule Take 2,000 Units by mouth daily at 6 (six) AM.  Cyanocobalamin (VITAMIN B-12) 2000 MCG TBCR Take 1 tablet by mouth daily.   empagliflozin (JARDIANCE) 10 MG TABS tablet Take 1 tablet (10 mg total) by mouth daily before breakfast.   Flaxseed, Linseed, (FLAX SEED OIL) 1000 MG CAPS Take 40 mg by mouth daily.   furosemide (LASIX) 40 MG tablet Take 1 tablet (40 mg total) by mouth 2 (two) times daily. Take 40 mg by mouth in the morning, may take an additional 40 mg in the afternoon if needed.   meloxicam (MOBIC) 15 MG tablet Take 1 tablet by mouth once daily   Multiple Vitamin (MULTIVITAMIN WITH MINERALS) TABS tablet Take 2 tablets by mouth daily. Doterra Brand   potassium chloride SA (KLOR-CON M) 20 MEQ tablet TAKE 1 TABLET BY MOUTH DAILY   sacubitril-valsartan (ENTRESTO) 24-26 MG Take 1 tablet by mouth 2 (two) times  daily.   Semaglutide, 1 MG/DOSE, 4 MG/3ML SOPN Inject 1 mg into the skin once a week.   Semaglutide,0.25 or 0.5MG /DOS, 2 MG/3ML SOPN As directed   Semaglutide,0.25 or 0.5MG /DOS, 2 MG/3ML SOPN Inject 0.5 mg into the skin once a week.   Specialty Vitamins Products (MAGNESIUM, AMINO ACID CHELATE,) 133 MG tablet Take 1 tablet by mouth daily.   spironolactone (ALDACTONE) 25 MG tablet TAKE 1 TABLET BY MOUTH DAILY   vitamin C (ASCORBIC ACID) 500 MG tablet Take 500 mg by mouth daily.   Zinc 15 MG CAPS Take 15 mg by mouth daily.     Review of Systems      All other systems reviewed and are otherwise negative except as noted above.  Physical Exam    VS:  BP 116/78 (BP Location: Left Arm, Patient Position: Sitting, Cuff Size: Large)   Pulse (!) 59   Ht 5\' 8"  (1.727 m)   Wt (!) 304 lb 4.8 oz (138 kg)   SpO2 98%   BMI 46.27 kg/m  , BMI Body mass index is 46.27 kg/m.  Wt Readings from Last 3 Encounters:  11/20/22 (!) 304 lb 4.8 oz (138 kg)  09/29/22 (!) 304 lb (137.9 kg)  09/12/22 298 lb (135.2 kg)     GEN: Well nourished, overweight, well developed, in no acute distress. HEENT: normal. Neck: Supple, no JVD, carotid bruits, or masses. Cardiac: RRR, no murmurs, rubs, or gallops. No clubbing, cyanosis. Non pitting bilateral LE edema. .  Radials/PT 2+ and equal bilaterally.  Respiratory:  Respirations regular and unlabored, clear to auscultation bilaterally. GI: Soft, nontender, nondistended. MS: No deformity or atrophy. Skin: Warm and dry, no rash. Neuro:  Strength and sensation are intact. Psych: Normal affect.  Assessment & Plan    Chronic diastolic heart failure - Recent CPX with no significant cardiac nor pulmonary limitations. Continue Lasix to 40mg  BID, Entresto to 24-26mg  BID, Jardiance, spironolactone. Low sodium diet, fluid restriction <2L, and daily weights encouraged. Educated to contact our office for weight gain of 2 lbs overnight or 5 lbs in one week.  Has Entresto and  Jardiance patient assistance.   LE edema / venous insufficiency - Recommend elevating legs when sitting, compression socks, low sodium diet. Continue present doses of Spironolactone and Lasix.   HTN - Hypotensive today with occasional lightheadedness. Stop Amlodipiune. Continue Entresto, Lasix, Spironolactone. Discussed to monitor BP at home at least 2 hours after medications and sitting for 5-10 minutes.   Mild nonobstructive CAD - CTA 05/2019 coronary calcium score 1.9. Declines statin, as below. Stable with no anginal symptoms. No indication for ischemic evaluation.    HLD,  LDL goal <70 - Myalgias with crestor. Has declined additional statin.  Obesity/DM2 -  On Ozempic - presently trialing 0.5mg  dose. Weight loss via diet and exercise encouraged. Discussed the impact being overweight would have on cardiovascular risk.   OSA - STOP Bang 6. Per her report no prior sleep study. She was using her husband's CPAP previously provided by the [place] but it has broken. Did feel she got better sleep while using. She does snore at night and wakes feeling tired. Plan for in lab sleep study. Given her body habitus, anticipate diagnosis of OSA particularly as she previously had improvement in sleep when using her husband's CPAP, in lab sleep study preferred.       Disposition: Follow up in 3-4 month(s) with Jodelle Red, MD or APP.  Signed, Alver Sorrow, NP 11/20/2022, 9:10 AM  [facility]

## 2022-11-20 NOTE — Patient Instructions (Addendum)
Medication Instructions:  Your physician has recommended you make the following change in your medication:   STOP Amlodipine   *If you need a refill on your cardiac medications before your next appointment, please call your pharmacy*  Procedures: Your physician has recommended that you have a sleep study. This test records several body functions during sleep, including: brain activity, eye movement, oxygen and carbon dioxide blood levels, heart rate and rhythm, breathing rate and rhythm, the flow of air through your mouth and nose, snoring, body muscle movements, and chest and belly movement.   Follow-Up: At [facility], you and your health needs are our priority.  As part of our continuing mission to provide you with exceptional heart care, we have created designated Provider Care Teams.  These Care Teams include your primary Cardiologist (physician) and Advanced Practice Providers (APPs -  Physician Assistants and Nurse Practitioners) who all work together to provide you with the care you need, when you need it.  We recommend signing up for the patient portal called "MyChart".  Sign up information is provided on this After Visit Summary.  MyChart is used to connect with patients for Virtual Visits (Telemedicine).  Patients are able to view lab/test results, encounter notes, upcoming appointments, etc.  Non-urgent messages can be sent to your provider as well.   To learn more about what you can do with MyChart, go to [email or web address].    Your next appointment:   3-4 month(s)  Provider:   Jodelle Red, MD    Other Instructions  Tips to Measure your Blood Pressure Correctly  Here's what you can do to ensure a correct reading:  Don't drink a caffeinated beverage or smoke during the 30 minutes before the test.  Sit quietly for five minutes before the test begins.  During the measurement, sit in a chair with your feet on the floor and your arm supported so your  elbow is at about heart level.  The inflatable part of the cuff should completely cover at least 80% of your upper arm, and the cuff should be placed on bare skin, not over a shirt.  Don't talk during the measurement.  Blood pressure categories  Blood pressure category SYSTOLIC (upper number)  DIASTOLIC (lower number)  Normal Less than 120 mm Hg and Less than 80 mm Hg  Elevated 120-129 mm Hg and Less than 80 mm Hg  High blood pressure: Stage 1 hypertension 130-139 mm Hg or 80-89 mm Hg  High blood pressure: Stage 2 hypertension 140 mm Hg or higher or 90 mm Hg or higher  Hypertensive crisis (consult your doctor immediately) Higher than 180 mm Hg and/or Higher than 120 mm Hg  Source: American Heart Association and American Stroke Association. For more on getting your blood pressure under control, buy Controlling Your Blood Pressure, a Special Health Report from [facility].   Blood Pressure Log   Date   Time  Blood Pressure  Example: Nov 1 9 AM 124/78

## 2022-11-21 DIAGNOSIS — M47816 Spondylosis without myelopathy or radiculopathy, lumbar region: Secondary | ICD-10-CM | POA: Diagnosis not present

## 2022-11-21 DIAGNOSIS — M9903 Segmental and somatic dysfunction of lumbar region: Secondary | ICD-10-CM | POA: Diagnosis not present

## 2022-11-21 DIAGNOSIS — M5441 Lumbago with sciatica, right side: Secondary | ICD-10-CM | POA: Diagnosis not present

## 2022-11-24 ENCOUNTER — Telehealth (HOSPITAL_BASED_OUTPATIENT_CLINIC_OR_DEPARTMENT_OTHER): Payer: Self-pay | Admitting: *Deleted

## 2022-11-24 DIAGNOSIS — E119 Type 2 diabetes mellitus without complications: Secondary | ICD-10-CM

## 2022-11-24 MED ORDER — SEMAGLUTIDE (1 MG/DOSE) 4 MG/3ML ~~LOC~~ SOPN: 1.0000 mg | PEN_INJECTOR | SUBCUTANEOUS | 0 refills | Status: AC

## 2022-11-24 NOTE — Telephone Encounter (Signed)
Patient assistance forms for the Ozempic 1 mg weekly filled out Will fax after Dr Cristal Deer signs

## 2022-11-24 NOTE — Progress Notes (Signed)
Has been confirmed by Dr. Gala Romney, no limitations from heart/lung perspective.

## 2022-11-27 DIAGNOSIS — M5441 Lumbago with sciatica, right side: Secondary | ICD-10-CM | POA: Diagnosis not present

## 2022-11-27 DIAGNOSIS — M9903 Segmental and somatic dysfunction of lumbar region: Secondary | ICD-10-CM | POA: Diagnosis not present

## 2022-11-27 DIAGNOSIS — M47816 Spondylosis without myelopathy or radiculopathy, lumbar region: Secondary | ICD-10-CM | POA: Diagnosis not present

## 2022-11-28 NOTE — Telephone Encounter (Signed)
Received notification Ozempic approved through 11/21/2023  [employer] message sent to patient

## 2022-12-04 DIAGNOSIS — M5441 Lumbago with sciatica, right side: Secondary | ICD-10-CM | POA: Diagnosis not present

## 2022-12-04 DIAGNOSIS — M47816 Spondylosis without myelopathy or radiculopathy, lumbar region: Secondary | ICD-10-CM | POA: Diagnosis not present

## 2022-12-04 DIAGNOSIS — M9903 Segmental and somatic dysfunction of lumbar region: Secondary | ICD-10-CM | POA: Diagnosis not present

## 2022-12-05 ENCOUNTER — Telehealth (HOSPITAL_BASED_OUTPATIENT_CLINIC_OR_DEPARTMENT_OTHER): Payer: Self-pay | Admitting: Cardiology

## 2022-12-05 NOTE — Telephone Encounter (Signed)
Please advise 

## 2022-12-05 NOTE — Telephone Encounter (Signed)
Patient wants to follow-up on getting her overnight sleep study scheduled.

## 2022-12-09 ENCOUNTER — Ambulatory Visit (HOSPITAL_BASED_OUTPATIENT_CLINIC_OR_DEPARTMENT_OTHER): Payer: PPO | Admitting: Family

## 2022-12-12 ENCOUNTER — Telehealth (HOSPITAL_BASED_OUTPATIENT_CLINIC_OR_DEPARTMENT_OTHER): Payer: Self-pay

## 2022-12-12 NOTE — Telephone Encounter (Signed)
Split Night sleep study ordered 11/20/22, not scheduled, sleep team can you please look into this?

## 2022-12-18 DIAGNOSIS — M9903 Segmental and somatic dysfunction of lumbar region: Secondary | ICD-10-CM | POA: Diagnosis not present

## 2022-12-18 DIAGNOSIS — M47816 Spondylosis without myelopathy or radiculopathy, lumbar region: Secondary | ICD-10-CM | POA: Diagnosis not present

## 2022-12-18 DIAGNOSIS — M5441 Lumbago with sciatica, right side: Secondary | ICD-10-CM | POA: Diagnosis not present

## 2022-12-19 ENCOUNTER — Other Ambulatory Visit: Payer: Self-pay | Admitting: Cardiology

## 2022-12-19 DIAGNOSIS — I5032 Chronic diastolic (congestive) heart failure: Secondary | ICD-10-CM | POA: Diagnosis not present

## 2022-12-19 DIAGNOSIS — I10 Essential (primary) hypertension: Secondary | ICD-10-CM | POA: Diagnosis not present

## 2022-12-19 DIAGNOSIS — E039 Hypothyroidism, unspecified: Secondary | ICD-10-CM | POA: Diagnosis not present

## 2022-12-19 DIAGNOSIS — E119 Type 2 diabetes mellitus without complications: Secondary | ICD-10-CM

## 2022-12-19 NOTE — Telephone Encounter (Signed)
Please assist with refill.  Thank you. 

## 2022-12-20 DIAGNOSIS — G4730 Sleep apnea, unspecified: Secondary | ICD-10-CM | POA: Diagnosis not present

## 2022-12-21 ENCOUNTER — Other Ambulatory Visit: Payer: Self-pay | Admitting: Cardiology

## 2022-12-22 NOTE — Telephone Encounter (Signed)
Rx request sent to pharmacy.  

## 2022-12-23 NOTE — Telephone Encounter (Signed)
**Note De-Identified Andrew Blasius Obfuscation** 6/4-PENDING-Outpatient Authorization [id number]

## 2022-12-25 DIAGNOSIS — M47816 Spondylosis without myelopathy or radiculopathy, lumbar region: Secondary | ICD-10-CM | POA: Diagnosis not present

## 2022-12-25 DIAGNOSIS — M5441 Lumbago with sciatica, right side: Secondary | ICD-10-CM | POA: Diagnosis not present

## 2022-12-25 DIAGNOSIS — M9903 Segmental and somatic dysfunction of lumbar region: Secondary | ICD-10-CM | POA: Diagnosis not present

## 2023-01-01 DIAGNOSIS — M47816 Spondylosis without myelopathy or radiculopathy, lumbar region: Secondary | ICD-10-CM | POA: Diagnosis not present

## 2023-01-01 DIAGNOSIS — M5441 Lumbago with sciatica, right side: Secondary | ICD-10-CM | POA: Diagnosis not present

## 2023-01-01 DIAGNOSIS — M9903 Segmental and somatic dysfunction of lumbar region: Secondary | ICD-10-CM | POA: Diagnosis not present

## 2023-01-08 DIAGNOSIS — R0981 Nasal congestion: Secondary | ICD-10-CM | POA: Diagnosis not present

## 2023-01-08 DIAGNOSIS — J343 Hypertrophy of nasal turbinates: Secondary | ICD-10-CM | POA: Diagnosis not present

## 2023-01-13 NOTE — Addendum Note (Signed)
Addended by: Brunetta Genera on: 01/13/2023 10:27 AM   Modules accepted: Orders

## 2023-01-15 DIAGNOSIS — M9903 Segmental and somatic dysfunction of lumbar region: Secondary | ICD-10-CM | POA: Diagnosis not present

## 2023-01-15 DIAGNOSIS — M5441 Lumbago with sciatica, right side: Secondary | ICD-10-CM | POA: Diagnosis not present

## 2023-01-15 DIAGNOSIS — M47816 Spondylosis without myelopathy or radiculopathy, lumbar region: Secondary | ICD-10-CM | POA: Diagnosis not present

## 2023-01-19 NOTE — Telephone Encounter (Signed)
**Note De-Identified Chisum Habenicht Obfuscation** I called [employer] to f/u on this split night PA and s/w Amy. Per Amy this PA was approved on 12/23/22 until 03/23/23. Authorization #:[id number]  I did notify the sleep lab [facility]ena their workqueue and VM.

## 2023-01-28 DIAGNOSIS — M47816 Spondylosis without myelopathy or radiculopathy, lumbar region: Secondary | ICD-10-CM | POA: Diagnosis not present

## 2023-01-28 DIAGNOSIS — M9903 Segmental and somatic dysfunction of lumbar region: Secondary | ICD-10-CM | POA: Diagnosis not present

## 2023-01-28 DIAGNOSIS — M5441 Lumbago with sciatica, right side: Secondary | ICD-10-CM | POA: Diagnosis not present

## 2023-01-29 DIAGNOSIS — G4733 Obstructive sleep apnea (adult) (pediatric): Secondary | ICD-10-CM | POA: Diagnosis not present

## 2023-02-11 DIAGNOSIS — M5441 Lumbago with sciatica, right side: Secondary | ICD-10-CM | POA: Diagnosis not present

## 2023-02-11 DIAGNOSIS — M47816 Spondylosis without myelopathy or radiculopathy, lumbar region: Secondary | ICD-10-CM | POA: Diagnosis not present

## 2023-02-11 DIAGNOSIS — M9903 Segmental and somatic dysfunction of lumbar region: Secondary | ICD-10-CM | POA: Diagnosis not present

## 2023-02-20 ENCOUNTER — Other Ambulatory Visit (HOSPITAL_COMMUNITY): Payer: Self-pay

## 2023-02-20 ENCOUNTER — Encounter (HOSPITAL_BASED_OUTPATIENT_CLINIC_OR_DEPARTMENT_OTHER): Payer: Self-pay | Admitting: Cardiology

## 2023-02-20 ENCOUNTER — Telehealth: Payer: Self-pay

## 2023-02-20 ENCOUNTER — Ambulatory Visit (INDEPENDENT_AMBULATORY_CARE_PROVIDER_SITE_OTHER): Payer: PPO | Admitting: Cardiology

## 2023-02-20 ENCOUNTER — Telehealth (HOSPITAL_BASED_OUTPATIENT_CLINIC_OR_DEPARTMENT_OTHER): Payer: Self-pay

## 2023-02-20 VITALS — BP 121/78 | HR 67 | Ht 68.0 in | Wt 287.9 lb

## 2023-02-20 DIAGNOSIS — Z7984 Long term (current) use of oral hypoglycemic drugs: Secondary | ICD-10-CM | POA: Diagnosis not present

## 2023-02-20 DIAGNOSIS — G4733 Obstructive sleep apnea (adult) (pediatric): Secondary | ICD-10-CM | POA: Diagnosis not present

## 2023-02-20 DIAGNOSIS — E782 Mixed hyperlipidemia: Secondary | ICD-10-CM | POA: Diagnosis not present

## 2023-02-20 DIAGNOSIS — I5032 Chronic diastolic (congestive) heart failure: Secondary | ICD-10-CM | POA: Diagnosis not present

## 2023-02-20 DIAGNOSIS — I10 Essential (primary) hypertension: Secondary | ICD-10-CM

## 2023-02-20 DIAGNOSIS — I2510 Atherosclerotic heart disease of native coronary artery without angina pectoris: Secondary | ICD-10-CM

## 2023-02-20 DIAGNOSIS — E119 Type 2 diabetes mellitus without complications: Secondary | ICD-10-CM | POA: Diagnosis not present

## 2023-02-20 DIAGNOSIS — E8881 Metabolic syndrome: Secondary | ICD-10-CM

## 2023-02-20 MED ORDER — MOUNJARO 2.5 MG/0.5ML ~~LOC~~ SOAJ
2.5000 mg | SUBCUTANEOUS | 4 refills | Status: DC
Start: 2023-02-20 — End: 2023-05-12

## 2023-02-20 NOTE — Telephone Encounter (Signed)
Pharmacy Patient Advocate Encounter  Received notification from [employer]/ [employer] that Prior Authorization for [facility] has been APPROVED from 02/20/23 to 02/20/24. Ran test claim, Copay is $0

## 2023-02-20 NOTE — Telephone Encounter (Signed)
Dr. Cristal Deer is changing Ozempic to [facility] 2.5 mg.  Probably needs prior authorization.  Thank you,  Barth Kirks

## 2023-02-20 NOTE — Patient Instructions (Signed)
Medication Instructions:  Change Ozempic to Mounjaro 2.5 mg.   Notifying our prior authorization team. We will contact you when we have the Prior Auth.   *If you need a refill on your cardiac medications before your next appointment, please call your pharmacy*   Lab Work: None    Testing/Procedures: None   Follow-Up: At [facility], you and your health needs are our priority.  As part of our continuing mission to provide you with exceptional heart care, we have created designated Provider Care Teams.  These Care Teams include your primary Cardiologist (physician) and Advanced Practice Providers (APPs -  Physician Assistants and Nurse Practitioners) who all work together to provide you with the care you need, when you need it.  We recommend signing up for the patient portal called "MyChart".  Sign up information is provided on this After Visit Summary.  MyChart is used to connect with patients for Virtual Visits (Telemedicine).  Patients are able to view lab/test results, encounter notes, upcoming appointments, etc.  Non-urgent messages can be sent to your provider as well.   To learn more about what you can do with MyChart, go to [email or web address].    Your next appointment:   3 month(s)  Provider:   Jodelle Red, MD    Other Instructions [email or web address]  [email or web address]

## 2023-02-20 NOTE — Telephone Encounter (Signed)
Pharmacy Patient Advocate Encounter   Received notification from Physician's Office that prior authorization for [facility] is required/requested.   Insurance verification completed.   The patient is insured through [employer]/ [employer] .   Per test claim: PA required; PA submitted to [employer]/ [employer] via CoverMyMeds Key/confirmation #/EOC [id number] Status is pending

## 2023-02-20 NOTE — Telephone Encounter (Signed)
02/20/2023 Patient notified of PA approval for [facility]. Jim Like MHA RN CCM

## 2023-02-20 NOTE — Telephone Encounter (Signed)
PA request has been Submitted. New Encounter created for follow up. For additional info see Pharmacy Prior Auth telephone encounter from 02/20/23.

## 2023-02-20 NOTE — Progress Notes (Signed)
Cardiology Office Note:  .    Date:  02/20/2023  ID:  KERIN KREN, DOB [birth year], MRN [id number] PCP: Elfredia Nevins, MD  [facility] Providers Cardiologist:  Jodelle Red, MD     History of Present Illness: .    FARHEEN PFAHLER is a 67 y.o. female with a hx of chronic diastolic heart failure, sinus bradycardia, nonobstructive CAD, aortic atherosclerosis, hypertension, hyperlipidemia, and GERD, who is seen for follow-up.    Cardiovascular  Comorbid conditions: Hypertension. Hyperlipidemia- Crestor was previously stopped due to achiness. Was trying essential oils to reduce cholesterol. Prior cardiac testing and/or incidental findings on other testing (ie coronary calcium): Prior Coronary CT 05/2019 revealed calcium score of 19. Previously admitted to [facility] 11/20/20 for SOB. CT was negative for PE or lung issues, and Echo showed normal LVEF. Exercise level: Her shortness of breath is very noticeable with activity such as bending over while doing laundry, and walking. She usually pushes through her shortness of breath while walking. They have a woodworking shop. Her husband will also watch her breathing and encourages her to sit down and rest when needed. Sometimes she feels short of breath with lying down. However, they have a bed that raises and they usually sleep on an incline.   She followed up with Gillian Shields, NP 08/18/2022 and was noted to have exacerbation of chronic diastolic heart failure with weight gain of 6 pounds. Lasix was increased to 40 mg BID. Sherryll Burger was reduced to 24-26 mg BID due to lightheadedness and to prevent hypotension. Echocardiogram was updated 08/20/2022 revealing LVEF 55-60%, mild LVH, and mild dilatation of the ascending aorta measuring 40 mm.   In 08/2022, she admitted to initially taking Lasix incorrectly at 2 tablets in the morning (6:30 AM) and 1 tablet at lunch (11:30 AM - 1 PM), which was working well with greater urine  production and relief of her symptoms. At one point she resumed taking 1 tablet of Lasix BID and was feeling more short of breath with increased exercise intolerance. On exam she appeared euvolemic. She noted occasional palpitations triggered by caffeine. She was investigating coffee alternatives. Her LDL was 138. We discussed recommendations, trial of another statin, alternatives. She declined starting new medications.   At her visit 09/2022, she was struggling with worsening BLE edema L>R, and more difficulty breathing with activity. Blood pressures were well controlled at home. She had completed her third dose of 0.25 mg Ozempic, had noticed decreasing appetites. CPX testing 10/2022 was very reassuring, no limitations from heart/lung perspective. She followed up with Gillian Shields, NP 11/2022 and was hypotensive in the office. She reported occasional lightheadedness. Amlodipine was stopped. In lab sleep study was ordered but not completed.  Today, she complains of GI side effects of Ozempic. She has struggled with frequent indigestion, gastric pain, and diarrhea. Since starting Ozempic she is now unable to tolerate eating breads, which is new for her. Has a very small appetite, she notes that she could eat half a bagel at most for breakfast. Currently she is on the second or third pen of the 1 mg dose. She has lost some weight, was recently 285 lbs at home (initially 303 lbs). Due to the side effects, she has started administering half of the weekly dose on Sundays, and the other half on Wednesdays. However she continues to struggle with the GI pain, diarrhea. One night, had acid reflux issues. She does not recall what she had eaten that day.   She  reports that she will be able to obtain her own CPAP today.  She denies any palpitations, chest pain, shortness of breath, peripheral edema, lightheadedness, headaches, syncope, orthopnea, or PND.  ROS:  Please see the history of present illness. ROS otherwise  negative except as noted.  (+) Gastric/abdominal pain (+) Indigestion (+) Diarrhea (+) Loss of appetite  Studies Reviewed: .         Cardiopulmonary Exercise Test  11/13/2022: Attending: Normal functional capacity with no evidence of any significant cardiopulmonary abnormality. Patient's perceived exercise intolerance likely related to her body habitus as the measured peak VO2 doubles when adjusted to predicted ideal body weight.   Physical Exam:    VS:  BP 121/78 (BP Location: Left Arm, Patient Position: Sitting, Cuff Size: Large)   Pulse 67   Ht 5\' 8"  (1.727 m)   Wt 287 lb 14.4 oz (130.6 kg)   SpO2 97%   BMI 43.78 kg/m    Wt Readings from Last 3 Encounters:  02/20/23 287 lb 14.4 oz (130.6 kg)  11/20/22 (!) 304 lb 4.8 oz (138 kg)  09/29/22 (!) 304 lb (137.9 kg)    GEN: Well nourished, well developed in no acute distress HEENT: Normal, moist mucous membranes NECK: No JVD CARDIAC: regular rhythm, normal S1 and S2, no rubs or gallops. No murmur. VASCULAR: Radial and DP pulses 2+ bilaterally. No carotid bruits RESPIRATORY:  Clear to auscultation without rales, wheezing or rhonchi  ABDOMEN: Soft, non-tender, non-distended MUSCULOSKELETAL:  Ambulates independently SKIN: Warm and dry, no pitting edema. Very minimal nonpitting edema today NEUROLOGIC:  Alert and oriented x 3. No focal neuro deficits noted. PSYCHIATRIC:  Normal affect   ASSESSMENT AND PLAN: .    Chronic diastolic heart failure Dyspnea -reviewed CPX test results, reassuring -doing well on Jardiance 10 mg -continue entresto 24-46 mg BID and spironolactone 25 mg daily   Chronic LE edema, suspect component of chronic venous insufficiency -improved today -discussed compression, elevation -discussed amlodipine may contribute but isn't likely to be the primary issue   Hypertension: at goal -continue amlodipine 5 mg daily. Could decrease or stop amlodipine if BP similar at follow up -continue entresto,  spironolactone as above   Hypercholesterolemia and hypertriglyceridemia Nonobstructive CAD based on CCTA -LDL goal <70 -had muscle aches on rosuvastatin -discussed recommendations, trial of another statin, alternatives. Declines at this time -is on aspirin 81 mg daily -reviewed red flag warning signs that need immediate medical attention   Lipids 08/26/22: Tchol 244, TG 203, HDL 55, LDL 152   BMI 45->43, obesity Type II diabetes (prior A1c 6.5) -this plus lipid pattern, hypertension, diabetes support metabolic syndrome -has had GI issues on 1 mg dosing, has lose about 20 lbs. Does not think she can tolerate Ozempic long term. Will change to [facility], she will monitor symptoms   OSA: receiving her new CPAP today   Cardiac risk counseling and prevention recommendations: -recommend heart healthy/Mediterranean diet, with whole grains, fruits, vegetable, fish, lean meats, nuts, and olive oil. Limit salt. -recommend moderate walking, 3-5 times/week for 30-50 minutes each session. Aim for at least 150 minutes.week. Goal should be pace of 3 miles/hours, or walking 1.5 miles in 30 minutes -recommend avoidance of tobacco products. Avoid excess alcohol.  Dispo: Follow-up in 3 months, or sooner as needed.  I,Mathew Stumpf,acting as a Neurosurgeon for [employer], MD.,have documented all relevant documentation on the behalf of Jodelle Red, MD,as directed by  Jodelle Red, MD while in the presence of Jodelle Red, MD.  I,  Jodelle Red, MD, have reviewed all documentation for this visit. The documentation on 02/20/23 for the exam, diagnosis, procedures, and orders are all accurate and complete.   Signed, Jodelle Red, MD

## 2023-03-10 DIAGNOSIS — H2513 Age-related nuclear cataract, bilateral: Secondary | ICD-10-CM | POA: Diagnosis not present

## 2023-03-10 DIAGNOSIS — Z7984 Long term (current) use of oral hypoglycemic drugs: Secondary | ICD-10-CM | POA: Diagnosis not present

## 2023-03-10 DIAGNOSIS — E119 Type 2 diabetes mellitus without complications: Secondary | ICD-10-CM | POA: Diagnosis not present

## 2023-03-23 DIAGNOSIS — G4733 Obstructive sleep apnea (adult) (pediatric): Secondary | ICD-10-CM | POA: Diagnosis not present

## 2023-04-22 DIAGNOSIS — G4733 Obstructive sleep apnea (adult) (pediatric): Secondary | ICD-10-CM | POA: Diagnosis not present

## 2023-04-23 ENCOUNTER — Other Ambulatory Visit (HOSPITAL_COMMUNITY): Payer: Self-pay | Admitting: Internal Medicine

## 2023-04-23 DIAGNOSIS — Z1231 Encounter for screening mammogram for malignant neoplasm of breast: Secondary | ICD-10-CM

## 2023-05-05 ENCOUNTER — Telehealth: Payer: Self-pay | Admitting: Cardiology

## 2023-05-05 NOTE — Telephone Encounter (Signed)
Returned call to patient,

## 2023-05-05 NOTE — Telephone Encounter (Signed)
Patient was supposed to D/c ozempic due to not tolerating it and start mounjaro, she didn't. Has been on 1mg  for extended period of time, should she titrate up to 2mg ?

## 2023-05-05 NOTE — Telephone Encounter (Signed)
Patient said that she will be coming in to pick up her ozempic. Patient says that she will not take mounjaro.

## 2023-05-05 NOTE — Telephone Encounter (Signed)
Responded to patient.

## 2023-05-12 MED ORDER — OZEMPIC (1 MG/DOSE) 4 MG/3ML ~~LOC~~ SOPN: PEN_INJECTOR | SUBCUTANEOUS | 3 refills | Status: DC

## 2023-05-12 NOTE — Addendum Note (Signed)
Addended by: Marlene Lard on: 05/12/2023 01:26 PM   Modules accepted: Orders

## 2023-06-01 NOTE — Progress Notes (Signed)
Cardiology Office Note:  .    Date:  06/02/2023  ID:  Meagan Reyes, DOB 1956/02/19, MRN [id number] PCP: Elfredia Nevins, MD  [facility] Providers Cardiologist:  Jodelle Red, MD     History of Present Illness: .    Meagan Reyes is a 67 y.o. female with a hx of chronic diastolic heart failure, sinus bradycardia, nonobstructive CAD, aortic atherosclerosis, hypertension, hyperlipidemia, and OSA on CPAP, who is seen for follow-up.    CV history: CT coronary in 2020 with Ca score 1.0, nonobstructive CAD. Has chronic shortness of breath; echo unrevealing, had CPX 10/2022 which did not show any cardiac, vascular, or pulm limitations. Had GI side effects on Ozempic. Had muscle aches on rosuvastatin.  Today: Shortness of breath is much improved. Has continued to lose weight, lowest weight 283 lbs. Has had some limitations from back and knee pain in terms of activity.  Doing well with her CPAP. Had swollen turbinates when she saw ENT, doing better on flonase. On probiotics/supplements.  Has had some easy bruising but no bleeding. No recent changes to medications. Has been on aspirin for some time.  Reviewed meds today, list updated. Feels that BID lasix is not always enough to manage her edema, but she makes good urine on it. Has been taking 0.5 mg of semaglutide twice a week instead of 1 mg once a week. Discussed dosing today.  ROS:  Denies chest pain, shortness of breath at rest or with normal exertion. No PND, orthopnea, or unexpected weight gain. No syncope or palpitations. ROS otherwise negative except as noted.   Studies Reviewed: Marland Kitchen         Physical Exam:    VS:  BP 112/64 (BP Location: Left Arm, Patient Position: Sitting, Cuff Size: Large)   Pulse 62   Ht 5\' 8"  (1.727 m)   Wt 290 lb 8 oz (131.8 kg)   SpO2 98%   BMI 44.17 kg/m    Wt Readings from Last 3 Encounters:  06/02/23 290 lb 8 oz (131.8 kg)  02/20/23 287 lb 14.4 oz (130.6 kg)  11/20/22 (!) 304 lb  4.8 oz (138 kg)    GEN: Well nourished, well developed in no acute distress HEENT: Normal, moist mucous membranes NECK: No JVD CARDIAC: regular rhythm, normal S1 and S2, no rubs or gallops. No murmur. VASCULAR: Radial and DP pulses 2+ bilaterally. No carotid bruits RESPIRATORY:  Clear to auscultation without rales, wheezing or rhonchi  ABDOMEN: Soft, non-tender, non-distended MUSCULOSKELETAL:  Ambulates independently SKIN: Warm and dry, no pitting edema. Very minimal nonpitting edema today NEUROLOGIC:  Alert and oriented x 3. No focal neuro deficits noted. PSYCHIATRIC:  Normal affect   ASSESSMENT AND PLAN: .    Chronic diastolic heart failure Dyspnea, improved -reviewed CPX test results, reassuring -doing well on Jardiance 10 mg -continue entresto 24-46 mg 1/2 tab BID and spironolactone 25 mg daily   Chronic LE edema, suspect component of chronic venous insufficiency -improved today -discussed compression, elevation -no significant change with stopping amlodipine   Hypertension:  -well controlled, BP running low enough that it limits uptitration of meds -stopped amlodipine -continue entresto (takes 1/2 pill BID), spironolactone as above   Hypercholesterolemia and hypertriglyceridemia Nonobstructive CAD based on CCTA Statin myopathy -LDL goal <70. Lipids 08/26/22: Tchol 244, TG 203, HDL 55, LDL 152 -had muscle aches on rosuvastatin -discussed recommendations, trial of another statin, alternatives to statins including zetia, nexletol, PCSK9i. Declines at this time. She is using supplements to manage this.  If still elevated at her annual, would consider PCSK9i -is on aspirin 81 mg daily, concerned about bruising. She will monitor for bleeding -reviewed red flag warning signs that need immediate medical attention   BMI 45->43->44, obesity Type II diabetes (prior A1c 6.5) -this plus lipid pattern, hypertension, diabetes support metabolic syndrome -has had GI issues on 1 mg  dosing, has lose about 20 lbs. Her body is adjusting, discussed dosing today.   OSA: doing well on her CPAP   Cardiac risk counseling and prevention recommendations: -recommend heart healthy/Mediterranean diet, with whole grains, fruits, vegetable, fish, lean meats, nuts, and olive oil. Limit salt. -recommend moderate walking, 3-5 times/week for 30-50 minutes each session. Aim for at least 150 minutes.week. Goal should be pace of 3 miles/hours, or walking 1.5 miles in 30 minutes -recommend avoidance of tobacco products. Avoid excess alcohol.  Dispo: Follow-up in 6 months, or sooner as needed.  Signed, Jodelle Red, MD

## 2023-06-02 ENCOUNTER — Ambulatory Visit (HOSPITAL_BASED_OUTPATIENT_CLINIC_OR_DEPARTMENT_OTHER): Payer: PPO | Admitting: Cardiology

## 2023-06-02 ENCOUNTER — Encounter (HOSPITAL_BASED_OUTPATIENT_CLINIC_OR_DEPARTMENT_OTHER): Payer: Self-pay | Admitting: Cardiology

## 2023-06-02 VITALS — BP 112/64 | HR 62 | Ht 68.0 in | Wt 290.5 lb

## 2023-06-02 DIAGNOSIS — E66813 Obesity, class 3: Secondary | ICD-10-CM

## 2023-06-02 DIAGNOSIS — E119 Type 2 diabetes mellitus without complications: Secondary | ICD-10-CM

## 2023-06-02 DIAGNOSIS — T466X5D Adverse effect of antihyperlipidemic and antiarteriosclerotic drugs, subsequent encounter: Secondary | ICD-10-CM | POA: Diagnosis not present

## 2023-06-02 DIAGNOSIS — I10 Essential (primary) hypertension: Secondary | ICD-10-CM

## 2023-06-02 DIAGNOSIS — G720 Drug-induced myopathy: Secondary | ICD-10-CM | POA: Diagnosis not present

## 2023-06-02 DIAGNOSIS — I5032 Chronic diastolic (congestive) heart failure: Secondary | ICD-10-CM | POA: Diagnosis not present

## 2023-06-02 DIAGNOSIS — Z7189 Other specified counseling: Secondary | ICD-10-CM | POA: Diagnosis not present

## 2023-06-02 DIAGNOSIS — E782 Mixed hyperlipidemia: Secondary | ICD-10-CM | POA: Diagnosis not present

## 2023-06-02 DIAGNOSIS — E8881 Metabolic syndrome: Secondary | ICD-10-CM

## 2023-06-02 DIAGNOSIS — I2510 Atherosclerotic heart disease of native coronary artery without angina pectoris: Secondary | ICD-10-CM

## 2023-06-02 DIAGNOSIS — T466X5A Adverse effect of antihyperlipidemic and antiarteriosclerotic drugs, initial encounter: Secondary | ICD-10-CM

## 2023-06-02 NOTE — Patient Instructions (Signed)

## 2023-06-04 DIAGNOSIS — M5441 Lumbago with sciatica, right side: Secondary | ICD-10-CM | POA: Diagnosis not present

## 2023-06-04 DIAGNOSIS — M9901 Segmental and somatic dysfunction of cervical region: Secondary | ICD-10-CM | POA: Diagnosis not present

## 2023-06-04 DIAGNOSIS — M9902 Segmental and somatic dysfunction of thoracic region: Secondary | ICD-10-CM | POA: Diagnosis not present

## 2023-06-04 DIAGNOSIS — M47812 Spondylosis without myelopathy or radiculopathy, cervical region: Secondary | ICD-10-CM | POA: Diagnosis not present

## 2023-06-04 DIAGNOSIS — M47816 Spondylosis without myelopathy or radiculopathy, lumbar region: Secondary | ICD-10-CM | POA: Diagnosis not present

## 2023-06-04 DIAGNOSIS — M9903 Segmental and somatic dysfunction of lumbar region: Secondary | ICD-10-CM | POA: Diagnosis not present

## 2023-06-05 DIAGNOSIS — G4733 Obstructive sleep apnea (adult) (pediatric): Secondary | ICD-10-CM | POA: Diagnosis not present

## 2023-06-08 ENCOUNTER — Ambulatory Visit (HOSPITAL_COMMUNITY)
Admission: RE | Admit: 2023-06-08 | Discharge: 2023-06-08 | Disposition: A | Discharge status: Home / Self Care | Payer: PPO | Source: Ambulatory Visit | Attending: Internal Medicine | Admitting: Internal Medicine

## 2023-06-08 DIAGNOSIS — Z1231 Encounter for screening mammogram for malignant neoplasm of breast: Secondary | ICD-10-CM | POA: Diagnosis not present

## 2023-06-09 DIAGNOSIS — M47816 Spondylosis without myelopathy or radiculopathy, lumbar region: Secondary | ICD-10-CM | POA: Diagnosis not present

## 2023-06-09 DIAGNOSIS — M9903 Segmental and somatic dysfunction of lumbar region: Secondary | ICD-10-CM | POA: Diagnosis not present

## 2023-06-09 DIAGNOSIS — M47812 Spondylosis without myelopathy or radiculopathy, cervical region: Secondary | ICD-10-CM | POA: Diagnosis not present

## 2023-06-09 DIAGNOSIS — M9902 Segmental and somatic dysfunction of thoracic region: Secondary | ICD-10-CM | POA: Diagnosis not present

## 2023-06-09 DIAGNOSIS — M9901 Segmental and somatic dysfunction of cervical region: Secondary | ICD-10-CM | POA: Diagnosis not present

## 2023-06-09 DIAGNOSIS — M5441 Lumbago with sciatica, right side: Secondary | ICD-10-CM | POA: Diagnosis not present

## 2023-06-15 ENCOUNTER — Other Ambulatory Visit: Payer: Self-pay | Admitting: Cardiology

## 2023-06-15 DIAGNOSIS — E119 Type 2 diabetes mellitus without complications: Secondary | ICD-10-CM

## 2023-06-16 DIAGNOSIS — M9902 Segmental and somatic dysfunction of thoracic region: Secondary | ICD-10-CM | POA: Diagnosis not present

## 2023-06-16 DIAGNOSIS — M47816 Spondylosis without myelopathy or radiculopathy, lumbar region: Secondary | ICD-10-CM | POA: Diagnosis not present

## 2023-06-16 DIAGNOSIS — M9901 Segmental and somatic dysfunction of cervical region: Secondary | ICD-10-CM | POA: Diagnosis not present

## 2023-06-16 DIAGNOSIS — M5441 Lumbago with sciatica, right side: Secondary | ICD-10-CM | POA: Diagnosis not present

## 2023-06-16 DIAGNOSIS — M9903 Segmental and somatic dysfunction of lumbar region: Secondary | ICD-10-CM | POA: Diagnosis not present

## 2023-06-16 DIAGNOSIS — M47812 Spondylosis without myelopathy or radiculopathy, cervical region: Secondary | ICD-10-CM | POA: Diagnosis not present

## 2023-06-17 ENCOUNTER — Other Ambulatory Visit: Payer: Self-pay | Admitting: Cardiology

## 2023-06-17 DIAGNOSIS — G4733 Obstructive sleep apnea (adult) (pediatric): Secondary | ICD-10-CM | POA: Diagnosis not present

## 2023-06-24 ENCOUNTER — Other Ambulatory Visit: Payer: Self-pay | Admitting: Cardiology

## 2023-06-24 DIAGNOSIS — M47812 Spondylosis without myelopathy or radiculopathy, cervical region: Secondary | ICD-10-CM | POA: Diagnosis not present

## 2023-06-24 DIAGNOSIS — I5032 Chronic diastolic (congestive) heart failure: Secondary | ICD-10-CM

## 2023-06-24 DIAGNOSIS — M9903 Segmental and somatic dysfunction of lumbar region: Secondary | ICD-10-CM | POA: Diagnosis not present

## 2023-06-24 DIAGNOSIS — M5441 Lumbago with sciatica, right side: Secondary | ICD-10-CM | POA: Diagnosis not present

## 2023-06-24 DIAGNOSIS — M9902 Segmental and somatic dysfunction of thoracic region: Secondary | ICD-10-CM | POA: Diagnosis not present

## 2023-06-24 DIAGNOSIS — M9901 Segmental and somatic dysfunction of cervical region: Secondary | ICD-10-CM | POA: Diagnosis not present

## 2023-06-24 DIAGNOSIS — M47816 Spondylosis without myelopathy or radiculopathy, lumbar region: Secondary | ICD-10-CM | POA: Diagnosis not present

## 2023-06-30 ENCOUNTER — Encounter (HOSPITAL_BASED_OUTPATIENT_CLINIC_OR_DEPARTMENT_OTHER): Payer: Self-pay

## 2023-06-30 DIAGNOSIS — E119 Type 2 diabetes mellitus without complications: Secondary | ICD-10-CM

## 2023-07-01 DIAGNOSIS — M9903 Segmental and somatic dysfunction of lumbar region: Secondary | ICD-10-CM | POA: Diagnosis not present

## 2023-07-01 DIAGNOSIS — M47812 Spondylosis without myelopathy or radiculopathy, cervical region: Secondary | ICD-10-CM | POA: Diagnosis not present

## 2023-07-01 DIAGNOSIS — M9902 Segmental and somatic dysfunction of thoracic region: Secondary | ICD-10-CM | POA: Diagnosis not present

## 2023-07-01 DIAGNOSIS — M9901 Segmental and somatic dysfunction of cervical region: Secondary | ICD-10-CM | POA: Diagnosis not present

## 2023-07-01 DIAGNOSIS — M47816 Spondylosis without myelopathy or radiculopathy, lumbar region: Secondary | ICD-10-CM | POA: Diagnosis not present

## 2023-07-01 DIAGNOSIS — M5441 Lumbago with sciatica, right side: Secondary | ICD-10-CM | POA: Diagnosis not present

## 2023-07-02 MED ORDER — OZEMPIC (1 MG/DOSE) 4 MG/3ML ~~LOC~~ SOPN: PEN_INJECTOR | SUBCUTANEOUS | 2 refills | Status: DC

## 2023-07-05 DIAGNOSIS — G4733 Obstructive sleep apnea (adult) (pediatric): Secondary | ICD-10-CM | POA: Diagnosis not present

## 2023-07-07 DIAGNOSIS — M5441 Lumbago with sciatica, right side: Secondary | ICD-10-CM | POA: Diagnosis not present

## 2023-07-07 DIAGNOSIS — M47812 Spondylosis without myelopathy or radiculopathy, cervical region: Secondary | ICD-10-CM | POA: Diagnosis not present

## 2023-07-07 DIAGNOSIS — M9902 Segmental and somatic dysfunction of thoracic region: Secondary | ICD-10-CM | POA: Diagnosis not present

## 2023-07-07 DIAGNOSIS — M47816 Spondylosis without myelopathy or radiculopathy, lumbar region: Secondary | ICD-10-CM | POA: Diagnosis not present

## 2023-07-07 DIAGNOSIS — M9903 Segmental and somatic dysfunction of lumbar region: Secondary | ICD-10-CM | POA: Diagnosis not present

## 2023-07-07 DIAGNOSIS — M9901 Segmental and somatic dysfunction of cervical region: Secondary | ICD-10-CM | POA: Diagnosis not present

## 2023-07-23 DIAGNOSIS — M47812 Spondylosis without myelopathy or radiculopathy, cervical region: Secondary | ICD-10-CM | POA: Diagnosis not present

## 2023-07-23 DIAGNOSIS — M9903 Segmental and somatic dysfunction of lumbar region: Secondary | ICD-10-CM | POA: Diagnosis not present

## 2023-07-23 DIAGNOSIS — M5441 Lumbago with sciatica, right side: Secondary | ICD-10-CM | POA: Diagnosis not present

## 2023-07-23 DIAGNOSIS — M9901 Segmental and somatic dysfunction of cervical region: Secondary | ICD-10-CM | POA: Diagnosis not present

## 2023-07-23 DIAGNOSIS — M47816 Spondylosis without myelopathy or radiculopathy, lumbar region: Secondary | ICD-10-CM | POA: Diagnosis not present

## 2023-07-23 DIAGNOSIS — M9902 Segmental and somatic dysfunction of thoracic region: Secondary | ICD-10-CM | POA: Diagnosis not present

## 2023-08-05 DIAGNOSIS — G4733 Obstructive sleep apnea (adult) (pediatric): Secondary | ICD-10-CM | POA: Diagnosis not present

## 2023-08-19 ENCOUNTER — Other Ambulatory Visit (HOSPITAL_BASED_OUTPATIENT_CLINIC_OR_DEPARTMENT_OTHER): Payer: Self-pay | Admitting: Family

## 2023-08-19 DIAGNOSIS — G4733 Obstructive sleep apnea (adult) (pediatric): Secondary | ICD-10-CM | POA: Diagnosis not present

## 2023-09-05 DIAGNOSIS — G4733 Obstructive sleep apnea (adult) (pediatric): Secondary | ICD-10-CM | POA: Diagnosis not present

## 2023-09-14 DIAGNOSIS — M9903 Segmental and somatic dysfunction of lumbar region: Secondary | ICD-10-CM | POA: Diagnosis not present

## 2023-09-14 DIAGNOSIS — M9901 Segmental and somatic dysfunction of cervical region: Secondary | ICD-10-CM | POA: Diagnosis not present

## 2023-09-14 DIAGNOSIS — M47816 Spondylosis without myelopathy or radiculopathy, lumbar region: Secondary | ICD-10-CM | POA: Diagnosis not present

## 2023-09-14 DIAGNOSIS — M9902 Segmental and somatic dysfunction of thoracic region: Secondary | ICD-10-CM | POA: Diagnosis not present

## 2023-09-14 DIAGNOSIS — M47812 Spondylosis without myelopathy or radiculopathy, cervical region: Secondary | ICD-10-CM | POA: Diagnosis not present

## 2023-09-14 DIAGNOSIS — M5441 Lumbago with sciatica, right side: Secondary | ICD-10-CM | POA: Diagnosis not present

## 2023-09-17 DIAGNOSIS — M47812 Spondylosis without myelopathy or radiculopathy, cervical region: Secondary | ICD-10-CM | POA: Diagnosis not present

## 2023-09-17 DIAGNOSIS — M9903 Segmental and somatic dysfunction of lumbar region: Secondary | ICD-10-CM | POA: Diagnosis not present

## 2023-09-17 DIAGNOSIS — M5441 Lumbago with sciatica, right side: Secondary | ICD-10-CM | POA: Diagnosis not present

## 2023-09-17 DIAGNOSIS — M9901 Segmental and somatic dysfunction of cervical region: Secondary | ICD-10-CM | POA: Diagnosis not present

## 2023-09-17 DIAGNOSIS — M47816 Spondylosis without myelopathy or radiculopathy, lumbar region: Secondary | ICD-10-CM | POA: Diagnosis not present

## 2023-09-17 DIAGNOSIS — M9902 Segmental and somatic dysfunction of thoracic region: Secondary | ICD-10-CM | POA: Diagnosis not present

## 2023-09-21 DIAGNOSIS — M9902 Segmental and somatic dysfunction of thoracic region: Secondary | ICD-10-CM | POA: Diagnosis not present

## 2023-09-21 DIAGNOSIS — M9901 Segmental and somatic dysfunction of cervical region: Secondary | ICD-10-CM | POA: Diagnosis not present

## 2023-09-21 DIAGNOSIS — M9903 Segmental and somatic dysfunction of lumbar region: Secondary | ICD-10-CM | POA: Diagnosis not present

## 2023-09-21 DIAGNOSIS — M47812 Spondylosis without myelopathy or radiculopathy, cervical region: Secondary | ICD-10-CM | POA: Diagnosis not present

## 2023-09-21 DIAGNOSIS — M5441 Lumbago with sciatica, right side: Secondary | ICD-10-CM | POA: Diagnosis not present

## 2023-09-21 DIAGNOSIS — M47816 Spondylosis without myelopathy or radiculopathy, lumbar region: Secondary | ICD-10-CM | POA: Diagnosis not present

## 2023-09-29 DIAGNOSIS — M9903 Segmental and somatic dysfunction of lumbar region: Secondary | ICD-10-CM | POA: Diagnosis not present

## 2023-09-29 DIAGNOSIS — M47816 Spondylosis without myelopathy or radiculopathy, lumbar region: Secondary | ICD-10-CM | POA: Diagnosis not present

## 2023-09-29 DIAGNOSIS — M9901 Segmental and somatic dysfunction of cervical region: Secondary | ICD-10-CM | POA: Diagnosis not present

## 2023-09-29 DIAGNOSIS — M5441 Lumbago with sciatica, right side: Secondary | ICD-10-CM | POA: Diagnosis not present

## 2023-09-29 DIAGNOSIS — M9902 Segmental and somatic dysfunction of thoracic region: Secondary | ICD-10-CM | POA: Diagnosis not present

## 2023-09-29 DIAGNOSIS — M47812 Spondylosis without myelopathy or radiculopathy, cervical region: Secondary | ICD-10-CM | POA: Diagnosis not present

## 2023-10-01 ENCOUNTER — Ambulatory Visit (HOSPITAL_COMMUNITY)
Admission: RE | Admit: 2023-10-01 | Discharge: 2023-10-01 | Disposition: A | Discharge status: Home / Self Care | Source: Ambulatory Visit | Attending: Internal Medicine | Admitting: Internal Medicine

## 2023-10-01 ENCOUNTER — Other Ambulatory Visit (HOSPITAL_COMMUNITY): Payer: Self-pay | Admitting: Internal Medicine

## 2023-10-01 DIAGNOSIS — E1159 Type 2 diabetes mellitus with other circulatory complications: Secondary | ICD-10-CM | POA: Diagnosis not present

## 2023-10-01 DIAGNOSIS — E261 Secondary hyperaldosteronism: Secondary | ICD-10-CM | POA: Diagnosis not present

## 2023-10-01 DIAGNOSIS — E559 Vitamin D deficiency, unspecified: Secondary | ICD-10-CM | POA: Diagnosis not present

## 2023-10-01 DIAGNOSIS — G8929 Other chronic pain: Secondary | ICD-10-CM | POA: Diagnosis not present

## 2023-10-01 DIAGNOSIS — M25562 Pain in left knee: Secondary | ICD-10-CM | POA: Diagnosis not present

## 2023-10-01 DIAGNOSIS — M25561 Pain in right knee: Secondary | ICD-10-CM | POA: Insufficient documentation

## 2023-10-01 DIAGNOSIS — E270 Other adrenocortical overactivity: Secondary | ICD-10-CM | POA: Diagnosis not present

## 2023-10-01 DIAGNOSIS — M1711 Unilateral primary osteoarthritis, right knee: Secondary | ICD-10-CM | POA: Diagnosis not present

## 2023-10-01 DIAGNOSIS — M1712 Unilateral primary osteoarthritis, left knee: Secondary | ICD-10-CM | POA: Diagnosis not present

## 2023-10-01 DIAGNOSIS — Z0001 Encounter for general adult medical examination with abnormal findings: Secondary | ICD-10-CM | POA: Diagnosis not present

## 2023-10-01 DIAGNOSIS — Z6841 Body Mass Index (BMI) 40.0 and over, adult: Secondary | ICD-10-CM | POA: Diagnosis not present

## 2023-10-01 DIAGNOSIS — G4733 Obstructive sleep apnea (adult) (pediatric): Secondary | ICD-10-CM | POA: Diagnosis not present

## 2023-10-01 DIAGNOSIS — I5032 Chronic diastolic (congestive) heart failure: Secondary | ICD-10-CM | POA: Diagnosis not present

## 2023-10-01 DIAGNOSIS — Z1331 Encounter for screening for depression: Secondary | ICD-10-CM | POA: Diagnosis not present

## 2023-10-01 DIAGNOSIS — I10 Essential (primary) hypertension: Secondary | ICD-10-CM | POA: Diagnosis not present

## 2023-10-03 DIAGNOSIS — G4733 Obstructive sleep apnea (adult) (pediatric): Secondary | ICD-10-CM | POA: Diagnosis not present

## 2023-10-06 DIAGNOSIS — M5441 Lumbago with sciatica, right side: Secondary | ICD-10-CM | POA: Diagnosis not present

## 2023-10-06 DIAGNOSIS — M9901 Segmental and somatic dysfunction of cervical region: Secondary | ICD-10-CM | POA: Diagnosis not present

## 2023-10-06 DIAGNOSIS — M9903 Segmental and somatic dysfunction of lumbar region: Secondary | ICD-10-CM | POA: Diagnosis not present

## 2023-10-06 DIAGNOSIS — M47812 Spondylosis without myelopathy or radiculopathy, cervical region: Secondary | ICD-10-CM | POA: Diagnosis not present

## 2023-10-06 DIAGNOSIS — M9902 Segmental and somatic dysfunction of thoracic region: Secondary | ICD-10-CM | POA: Diagnosis not present

## 2023-10-06 DIAGNOSIS — M47816 Spondylosis without myelopathy or radiculopathy, lumbar region: Secondary | ICD-10-CM | POA: Diagnosis not present

## 2023-10-07 LAB — LAB REPORT - SCANNED
A1c: 5.7
Albumin, Urine POC: 16.7
Albumin/Creatinine Ratio, Urine, POC: 9
Creatinine, POC: 182.9 mg/dL
EGFR: 79
TSH: 1.58 (ref 0.41–5.90)

## 2023-10-08 ENCOUNTER — Other Ambulatory Visit (HOSPITAL_COMMUNITY): Payer: Self-pay | Admitting: Internal Medicine

## 2023-10-08 DIAGNOSIS — I10 Essential (primary) hypertension: Secondary | ICD-10-CM

## 2023-10-12 DIAGNOSIS — M9903 Segmental and somatic dysfunction of lumbar region: Secondary | ICD-10-CM | POA: Diagnosis not present

## 2023-10-12 DIAGNOSIS — M9901 Segmental and somatic dysfunction of cervical region: Secondary | ICD-10-CM | POA: Diagnosis not present

## 2023-10-12 DIAGNOSIS — M9902 Segmental and somatic dysfunction of thoracic region: Secondary | ICD-10-CM | POA: Diagnosis not present

## 2023-10-12 DIAGNOSIS — M47812 Spondylosis without myelopathy or radiculopathy, cervical region: Secondary | ICD-10-CM | POA: Diagnosis not present

## 2023-10-12 DIAGNOSIS — M5441 Lumbago with sciatica, right side: Secondary | ICD-10-CM | POA: Diagnosis not present

## 2023-10-12 DIAGNOSIS — M47816 Spondylosis without myelopathy or radiculopathy, lumbar region: Secondary | ICD-10-CM | POA: Diagnosis not present

## 2023-10-19 DIAGNOSIS — M47816 Spondylosis without myelopathy or radiculopathy, lumbar region: Secondary | ICD-10-CM | POA: Diagnosis not present

## 2023-10-19 DIAGNOSIS — M9901 Segmental and somatic dysfunction of cervical region: Secondary | ICD-10-CM | POA: Diagnosis not present

## 2023-10-19 DIAGNOSIS — M5441 Lumbago with sciatica, right side: Secondary | ICD-10-CM | POA: Diagnosis not present

## 2023-10-19 DIAGNOSIS — M9902 Segmental and somatic dysfunction of thoracic region: Secondary | ICD-10-CM | POA: Diagnosis not present

## 2023-10-19 DIAGNOSIS — M9903 Segmental and somatic dysfunction of lumbar region: Secondary | ICD-10-CM | POA: Diagnosis not present

## 2023-10-19 DIAGNOSIS — M47812 Spondylosis without myelopathy or radiculopathy, cervical region: Secondary | ICD-10-CM | POA: Diagnosis not present

## 2023-10-26 DIAGNOSIS — M9901 Segmental and somatic dysfunction of cervical region: Secondary | ICD-10-CM | POA: Diagnosis not present

## 2023-10-26 DIAGNOSIS — M9902 Segmental and somatic dysfunction of thoracic region: Secondary | ICD-10-CM | POA: Diagnosis not present

## 2023-10-26 DIAGNOSIS — M47816 Spondylosis without myelopathy or radiculopathy, lumbar region: Secondary | ICD-10-CM | POA: Diagnosis not present

## 2023-10-26 DIAGNOSIS — M5441 Lumbago with sciatica, right side: Secondary | ICD-10-CM | POA: Diagnosis not present

## 2023-10-26 DIAGNOSIS — M9903 Segmental and somatic dysfunction of lumbar region: Secondary | ICD-10-CM | POA: Diagnosis not present

## 2023-10-26 DIAGNOSIS — M47812 Spondylosis without myelopathy or radiculopathy, cervical region: Secondary | ICD-10-CM | POA: Diagnosis not present

## 2023-10-29 ENCOUNTER — Telehealth: Payer: Self-pay | Admitting: Pharmacy Technician

## 2023-10-29 NOTE — Telephone Encounter (Signed)
 Pharmacy Patient Advocate Encounter   Received notification from Onbase that prior authorization for [facility] is required/requested.   Insurance verification completed.   The patient is insured through [facility] ADVANTAGE/RX ADVANCE .   Per test claim: PA required; PA submitted to above mentioned insurance via CoverMyMeds Key/confirmation #/EOC [id number] Status is pending

## 2023-10-30 NOTE — Telephone Encounter (Signed)
 Pharmacy Patient Advocate Encounter  Received notification from [facility] ADVANTAGE/RX ADVANCE that Prior Authorization for ozempic has been APPROVED from 10/29/23 to 10/28/24. Spoke to pharmacy to process.Copay is $0.00.    PA #/Case ID/Reference #: [id number]

## 2023-11-02 ENCOUNTER — Ambulatory Visit: Admitting: Orthopedic Surgery

## 2023-11-02 VITALS — BP 124/78 | HR 60 | Ht 68.0 in | Wt 280.0 lb

## 2023-11-02 DIAGNOSIS — M1712 Unilateral primary osteoarthritis, left knee: Secondary | ICD-10-CM

## 2023-11-02 DIAGNOSIS — M170 Bilateral primary osteoarthritis of knee: Secondary | ICD-10-CM | POA: Diagnosis not present

## 2023-11-02 DIAGNOSIS — M1711 Unilateral primary osteoarthritis, right knee: Secondary | ICD-10-CM

## 2023-11-02 NOTE — Progress Notes (Signed)
 Chief Complaint  Patient presents with   Knee Pain    Bilateral - hurting for a while gotten worse in last six months    Intake history:  BP 124/78   Pulse 60   Ht 5\' 8"  (1.727 m)   Wt 280 lb (127 kg)   BMI 42.57 kg/m  Body mass index is 42.57 kg/m.    WHAT ARE WE SEEING YOU FOR TODAY?   bilateral knee(s)  How Meagan Reyes has this bothered you? (DOI?DOS?WS?)  6 month(s) ago  Anticoag.  No  Diabetes No  Heart disease Yes  Hypertension Yes  SMOKING HX No  Kidney disease No  Any ALLERGIES ______________________________________________   Treatment:  Have you taken:  Tylenol Yes  Advil No  Had PT No  Had injection No  Other  __seeing chiropractor for knee pain_______________________

## 2023-11-02 NOTE — Patient Instructions (Signed)
 Tylenol 500 mg morning noon and night   Meloxicam once a day

## 2023-11-02 NOTE — Progress Notes (Signed)
 Patient: Meagan Reyes           Date of Birth: 1956/07/20           MRN: [id number] Visit Date: 11/02/2023 Requested by: Elfredia Nevins, MD [address] [place],  [place] [address] PCP: Elfredia Nevins, MD   Chief Complaint  Patient presents with   Knee Pain    Bilateral - hurting for a while gotten worse in last six months   Encounter Diagnoses  Name Primary?   Primary osteoarthritis of left knee Yes   Primary osteoarthritis of right knee     Plan:  68 year old female with osteoarthritis of both knees BMI is 42.57 she is encouraged to go ahead and take Tylenol 3 times a day and meloxicam once a day.  She did not want to take any medication but she is standing on her feet on concrete all day and is seeking some type of relief.  We discussed the treatment options including the full spectrum all the way up to knee replacement but in this setting, she has not tried any nonoperative measures yet and this would be prudent for her to do so.  Will see her in 30 days to see if she is made any improvement several options remain available  Chief Complaint  Patient presents with   Knee Pain    Bilateral - hurting for a while gotten worse in last six months    68 year old female bilateral knee pain worsening.  Patient stands on concrete all day.  No catching locking or giving way or mechanical symptoms.  Patient notes pain in the back of the knees and then down the side of the legs as well as across the front of the knees.  Knee Pain  Incident onset: 6 months pain both knees worsening. The pain is moderate. Pertinent negatives include no tingling.    Body mass index is 42.57 kg/m.   Problem list, medical hx, medications and allergies reviewed   Review of Systems  Constitutional:  Negative for chills, fever and weight loss.  Respiratory:  Negative for shortness of breath.   Cardiovascular:  Negative for chest pain.  Neurological:  Negative for tingling.     Allergies   Allergen Reactions   Atorvastatin Other (See Comments)    Myalgia; pt reports this occurred in past, was prescribed by different doctor.   Doxycycline     Shortness or breath one time another time had symptoms of heart attack   Rosuvastatin     Joint pain on 5mg  daily   Neurontin [Gabapentin] Rash    BP 124/78   Pulse 60   Ht 5\' 8"  (1.727 m)   Wt 280 lb (127 kg)   BMI 42.57 kg/m    Physical exam: Physical Exam Vitals and nursing note reviewed.  Constitutional:      General: She is awake.     Appearance: Normal appearance. She is well-developed and overweight. She is not ill-appearing, toxic-appearing or diaphoretic.  HENT:     Head: Normocephalic and atraumatic.  Eyes:     General: No scleral icterus.       Right eye: No discharge.        Left eye: No discharge.     Extraocular Movements: Extraocular movements intact.     Conjunctiva/sclera: Conjunctivae normal.     Pupils: Pupils are equal, round, and reactive to light.  Cardiovascular:     Rate and Rhythm: Normal rate.     Pulses: Normal pulses.  Musculoskeletal:  Comments: She maintains her range of motion.  She is tender on the medial and lateral joint lines no effusion.  Hard to tell if she has flexion contracture does not appear to  Ligaments stable  Skin:    General: Skin is warm and dry.     Capillary Refill: Capillary refill takes less than 2 seconds.  Neurological:     General: No focal deficit present.     Mental Status: She is alert and oriented to person, place, and time.     Sensory: No sensory deficit.     Motor: No weakness.     Coordination: Coordination normal.     Gait: Gait normal.  Psychiatric:        Mood and Affect: Mood normal.        Behavior: Behavior normal.        Thought Content: Thought content normal.        Judgment: Judgment normal.     Ortho Exam Data reviewed:   Image(s) reviewed with personal interpretation:  Right knee imaging left knee image dated October 13, 2023.   Both knees show moderate osteoarthritis left and right knee  Assessment and plan:  Encounter Diagnoses  Name Primary?   Primary osteoarthritis of left knee Yes   Primary osteoarthritis of right knee     Recommend good feet store to get good shoes  Tylenol 3 times a day 500 mg  Meloxicam once a day  Follow-up 30 days check progress discuss further treatment options if needed

## 2023-11-03 DIAGNOSIS — G4733 Obstructive sleep apnea (adult) (pediatric): Secondary | ICD-10-CM | POA: Diagnosis not present

## 2023-11-16 ENCOUNTER — Other Ambulatory Visit (HOSPITAL_BASED_OUTPATIENT_CLINIC_OR_DEPARTMENT_OTHER): Payer: Self-pay | Admitting: Cardiology

## 2023-11-16 DIAGNOSIS — I5032 Chronic diastolic (congestive) heart failure: Secondary | ICD-10-CM

## 2023-11-17 ENCOUNTER — Ambulatory Visit (HOSPITAL_COMMUNITY)
Admission: RE | Admit: 2023-11-17 | Discharge: 2023-11-17 | Disposition: A | Discharge status: Home / Self Care | Source: Ambulatory Visit | Attending: Internal Medicine | Admitting: Internal Medicine

## 2023-11-17 DIAGNOSIS — K429 Umbilical hernia without obstruction or gangrene: Secondary | ICD-10-CM | POA: Diagnosis not present

## 2023-11-17 DIAGNOSIS — I10 Essential (primary) hypertension: Secondary | ICD-10-CM | POA: Diagnosis not present

## 2023-11-17 DIAGNOSIS — I728 Aneurysm of other specified arteries: Secondary | ICD-10-CM | POA: Diagnosis not present

## 2023-11-17 DIAGNOSIS — K449 Diaphragmatic hernia without obstruction or gangrene: Secondary | ICD-10-CM | POA: Diagnosis not present

## 2023-11-17 DIAGNOSIS — K219 Gastro-esophageal reflux disease without esophagitis: Secondary | ICD-10-CM | POA: Diagnosis not present

## 2023-11-17 MED ORDER — IOHEXOL 350 MG/ML SOLN
125.0000 mL | Freq: Once | INTRAVENOUS | Status: AC | PRN
  Administered 2023-11-17: 125 mL via INTRAVENOUS

## 2023-11-18 LAB — POCT I-STAT CREATININE: Creatinine, Ser: 1 mg/dL (ref 0.44–1.00)

## 2023-11-26 DIAGNOSIS — M5441 Lumbago with sciatica, right side: Secondary | ICD-10-CM | POA: Diagnosis not present

## 2023-11-26 DIAGNOSIS — M9902 Segmental and somatic dysfunction of thoracic region: Secondary | ICD-10-CM | POA: Diagnosis not present

## 2023-11-26 DIAGNOSIS — M9903 Segmental and somatic dysfunction of lumbar region: Secondary | ICD-10-CM | POA: Diagnosis not present

## 2023-11-26 DIAGNOSIS — M9901 Segmental and somatic dysfunction of cervical region: Secondary | ICD-10-CM | POA: Diagnosis not present

## 2023-11-26 DIAGNOSIS — M47812 Spondylosis without myelopathy or radiculopathy, cervical region: Secondary | ICD-10-CM | POA: Diagnosis not present

## 2023-11-26 DIAGNOSIS — M47816 Spondylosis without myelopathy or radiculopathy, lumbar region: Secondary | ICD-10-CM | POA: Diagnosis not present

## 2023-11-30 ENCOUNTER — Ambulatory Visit: Admitting: Orthopedic Surgery

## 2023-11-30 DIAGNOSIS — M170 Bilateral primary osteoarthritis of knee: Secondary | ICD-10-CM

## 2023-11-30 DIAGNOSIS — M1711 Unilateral primary osteoarthritis, right knee: Secondary | ICD-10-CM

## 2023-11-30 DIAGNOSIS — M1712 Unilateral primary osteoarthritis, left knee: Secondary | ICD-10-CM

## 2023-11-30 MED ORDER — METHYLPREDNISOLONE ACETATE 40 MG/ML IJ SUSP
40.0000 mg | Freq: Once | INTRAMUSCULAR | Status: AC
  Administered 2023-11-30: 40 mg via INTRA_ARTICULAR

## 2023-11-30 NOTE — Progress Notes (Signed)
   There were no vitals taken for this visit.  There is no height or weight on file to calculate BMI.  Chief Complaint  Patient presents with   Follow-up    Recheck on bilateral knee pain    No diagnosis found.  DOI/DOS/ Date: none  Unchanged, but taking tylenol  makes it more tolerable

## 2023-11-30 NOTE — Addendum Note (Signed)
 Addended byArla Lab on: 11/30/2023 09:39 AM   Modules accepted: Orders

## 2023-11-30 NOTE — Progress Notes (Addendum)
   Chief Complaint  Patient presents with   Follow-up    Recheck on bilateral knee pain    Encounter Diagnoses  Name Primary?   Primary osteoarthritis of right knee Yes   Primary osteoarthritis of left knee     DOI/DOS/ Date: none  Unchanged, but taking tylenol  makes it more tolerable  Recheck both knees, currently on meloxicam  and Tylenol  which makes it more tolerable  No significant change with these medications  She is ambulatory without assistive devices but has joint line pain  At this point after discussion of treatment options which included but were not limited to continuing her current medications, weight loss, steroid injection, hyaluronic acid injection  We will attempt to joint injections if no improvement we will try hyaluronic acid injection  Continue current meds Continue weight loss methods  Procedure note for bilateral knee injections  Procedure note left knee injection verbal consent was obtained to inject left knee joint  Timeout was completed to confirm the site of injection  The medications used were 40 mg depomedrol and 3 cc of 1% lidocaine   Anesthesia was provided by ethyl chloride and the skin was prepped with alcohol.  After cleaning the skin with alcohol a 20-gauge needle was used to inject the left knee joint. There were no complications. A sterile bandage was applied.   Procedure note right knee injection verbal consent was obtained to inject right knee joint  Timeout was completed to confirm the site of injection  The medications used were 40 mg depomedrol and 3 cc of 1% lidocaine   Anesthesia was provided by ethyl chloride and the skin was prepped with alcohol.  After cleaning the skin with alcohol a 20-gauge needle was used to inject the right knee joint. There were no complications. A sterile bandage was applied.

## 2023-11-30 NOTE — Patient Instructions (Addendum)
 Continue medications   Continue weight loss methods  We will check which brand of Hyaluronic acid injections (there are several) your insurance covers. We will call you with price and schedule with you if insurance approves and you are okay with the out of pocket costs. If for any reason they will not cover or the out of pocket costs are high, we will discuss with you and let you know other options available. This process normally takes several weeks to hear back from us  the insurance approval process takes time.

## 2023-12-02 NOTE — Progress Notes (Unsigned)
 Cardiology Office Note:  .   Date:  12/03/2023  ID:  Meagan Reyes, DOB [birth year], MRN [id number] PCP: Kathyleen Parkins, MD  [facility] Providers Cardiologist:  Sheryle Donning, MD    Patient Profile: .      PMH Chronic HFpEF Obesity Sinus bradycardia Coronary artery disease Coronary CTA 05/2019 CAC Score of 1 (60th percentile) Mixed plaque ostial LCx with minimal stenosis Hypertension Hyperlipidemia Aortic atherosclerosis GERD  She established with cardiology initially seen by Dr. Avanell Bob 04/04/2019 for chest pain.  Coronary CT revealed calcium  score of 1, nonobstructive CAD. She had multiple ED and office visits for dyspnea.  Echo 04/2019 revealed diastolic dysfunction.  She was started on Entresto  and then later spironolactone .  Admission to [facility] 11/20/2020 for SOB.  Echo revealed normal LVEF 65 to 70%, no RWMA, no LVH, indeterminate diastolic parameters, normal RV, mild dilatation of ascending aorta at 40 mm.  CT was negative for PE.  At office visit 05/09/2021 with Dr. Avanell Bob, she reported improvement in her symptoms.   Seen by me in clinic 08/19/2021 at which time she reported 1 week history of increased DOE, weight gain 10 pounds. Increased LE edema. She admitted to snoring and would occasionally use her husband's CPAP.  She declined home sleep study. Amlodipine  was reduced from 10 mg to 5 mg daily.  Repeat TTE 08/20/2021 revealed normal LVEF 55 to 60%, no rwma, grade 2 diastolic dysfunction.  She was advised to increase Lasix  to 80 mg twice daily for 3 days.  Last cardiology clinic visit was 06/02/23 with Dr. Veryl Gottron. She underwent sleep study and was placed on CPAP.  CPX 10/2022 did not show any cardiac, vascular, or pulmonary limitations.  She had GI side effects on Ozempic  and muscle aches on rosuvastatin .  She was later placed on Mounjaro  and at follow-up was having successful weight loss. GDMT for diastolic dysfunction included Jardiance  10 mg daily, Entresto  24-46  mg half tablet twice daily, and spironolactone  25 mg daily.  She had no significant improvement in LE edema when discontinued amlodipine .  She declined nonstatin lipid-lowering therapy.       History of Present Illness: .    History of Present Illness Meagan Reyes is a very pleasant 68 year old female who presents today for follow-up of heart failure. She reports she is feeling great with the exception of having difficulties with the seal on her CPAP mask which is disturbing her sleep. She is having challenges with the supplier, [employer]. She has been on Jardiance  for heart failure but ran out due to issues with the patient assistance program. During the two weeks off the medication, she experienced increased shortness of breath. After resuming Jardiance  ten days ago, her symptoms have improved. Has chronic bilateral LE edema. She is having chronic back and knee pain but had a cortisone injection in her knee and has noted some improvement. She is using essential oils for management of pain and also for elevated cholesterol and is excited about improvement seen on recent lab work which she provided.  She denies chest pain, shortness of breath, orthopnea, PND, presyncope, syncope. She admits she needs to increase physical activity.   Discussed the use of AI scribe software for clinical note transcription with the patient, who gave verbal consent to proceed.   ROS: See HPI       Studies Reviewed: Aaron Aas   EKG Interpretation Date/Time:  Thursday Dec 03 2023 10:37:43 EDT Ventricular Rate:  60 PR Interval:  168  QRS Duration:  80 QT Interval:  382 QTC Calculation: 382 R Axis:   -26  Text Interpretation: Normal sinus rhythm Normal ECG When compared with ECG of 19-Nov-2020 07:11, PREVIOUS ECG IS PRESENT Confirmed by Slater Duncan [phone]) on 12/03/2023 10:54:45 AM     No results found for: "LIPOA"   Risk Assessment/Calculations:         STOP-Bang Score:         Physical Exam:   VS:  BP  120/76 (BP Location: Left Arm, Patient Position: Sitting, Cuff Size: Large)   Pulse 60   Ht 5\' 8"  (1.727 m)   Wt 277 lb 8 oz (125.9 kg)   SpO2 96%   BMI 42.19 kg/m    Wt Readings from Last 3 Encounters:  12/03/23 277 lb 8 oz (125.9 kg)  11/02/23 280 lb (127 kg)  06/02/23 290 lb 8 oz (131.8 kg)    GEN: Obese, well developed in no acute distress NECK: No JVD; No carotid bruits CARDIAC: RRR, no murmurs, rubs, gallops RESPIRATORY:  Clear to auscultation without rales, wheezing or rhonchi  ABDOMEN: Soft, non-tender, non-distended EXTREMITIES:  Bilateral LE edema; No deformity     ASSESSMENT AND PLAN: .    Assessment & Plan Chronic HFpEF/Chronic DOE History of chronic DOE and LE edema. Most recent echo 08/20/2022 with LVEF 55 to 60%, normal diastolic parameters, mild LVH, normal RV. She notes improvement in DOE over the past several months with brief increase in SOB during lapse in taking Jardiance  which has been resolved.  Weight has been stable.  No orthopnea or PND.  Renal function stable on 10/01/2023 which she provided from PCP. Continue low dose Entresto , spironolactone , Lasix  and Jardiance .   CAD Coronary CTA 05/30/2019 with CAC score of 1.9, nonobstructive mild CAD. She denies chest pain, dyspnea, or other symptoms concerning for angina.  No indication for further ischemic evaluation at this time. We discussed goal LDL < 70 given mild CAD. She continues to decline lipid lowering therapy with preference for essential oils. Focus on secondary prevention including heart healthy mostly plant based diet avoiding saturated fat, processed foods, simple carbohydrates, and sugar along with aiming for at least 150 minutes of moderate intensity exercise each week.   Hyperlipidemia LDL goal < 70 Lipid panel completed 10/01/2023 with total cholesterol 196, triglycerides 160, HDL 47, and LDL 121.  She is excited about improvement in LDL from 152.  Has been using essential oils and has improved her diet.   She is also successfully lost weight. I congratulated her on this achievement and advised that to reduce ASCVD risk, goal LDL is < 70. She continues to politely decline lipid-lowering therapy, but will consider this in the future if LDL remains above goal.  Obstructive sleep apnea   Is having issues with CPAP mask fit due to weight loss, causing air leaks and disturbed sleep. Compliance encouraged. She will continue to try to resolve this issues; agrees that she feels better on CPAP.   Obesity She is tolerating semaglutide  without concerning side effects and has achieved some weight loss. Continue healthy diet and aim to increase physical activity to achieve > 150 minutes of moderate intensity exercise each week. Suggested water aerobics due to chronic knee and back pain.   Hypertension BP is well-controlled.  Renal function stable on labs completed 03/02/2024.  No changes in antihypertensive therapy today.  Hyperlipidemia   Lipid levels are improving with doTERRA oils but remain above target. Genetic factors and potential need for  pharmacological intervention were discussed. Continue doTERRA oils and monitor cholesterol levels. Discuss non-statin options if targets are not met.  Chronic Knee and back pain    Chronic knee and back pain interfere with physical activity. Cortisone injection to knee is providing relief. She uses liquid collagen and Tylenol  for pain management. Continue prescribed exercises and consider aqua therapy to increase physical activity.         Disposition:6 months with Dr. Veryl Gottron or APP  Signed, Slater Duncan, NP-C

## 2023-12-03 ENCOUNTER — Encounter (HOSPITAL_BASED_OUTPATIENT_CLINIC_OR_DEPARTMENT_OTHER): Payer: Self-pay

## 2023-12-03 ENCOUNTER — Ambulatory Visit (HOSPITAL_BASED_OUTPATIENT_CLINIC_OR_DEPARTMENT_OTHER): Payer: PPO | Admitting: Nurse Practitioner

## 2023-12-03 ENCOUNTER — Encounter (HOSPITAL_BASED_OUTPATIENT_CLINIC_OR_DEPARTMENT_OTHER): Payer: Self-pay | Admitting: Nurse Practitioner

## 2023-12-03 VITALS — BP 120/76 | HR 60 | Ht 68.0 in | Wt 277.5 lb

## 2023-12-03 DIAGNOSIS — I2510 Atherosclerotic heart disease of native coronary artery without angina pectoris: Secondary | ICD-10-CM

## 2023-12-03 DIAGNOSIS — E119 Type 2 diabetes mellitus without complications: Secondary | ICD-10-CM

## 2023-12-03 DIAGNOSIS — G8929 Other chronic pain: Secondary | ICD-10-CM | POA: Diagnosis not present

## 2023-12-03 DIAGNOSIS — I10 Essential (primary) hypertension: Secondary | ICD-10-CM

## 2023-12-03 DIAGNOSIS — E782 Mixed hyperlipidemia: Secondary | ICD-10-CM

## 2023-12-03 DIAGNOSIS — G4733 Obstructive sleep apnea (adult) (pediatric): Secondary | ICD-10-CM | POA: Diagnosis not present

## 2023-12-03 DIAGNOSIS — I5032 Chronic diastolic (congestive) heart failure: Secondary | ICD-10-CM

## 2023-12-03 DIAGNOSIS — E785 Hyperlipidemia, unspecified: Secondary | ICD-10-CM | POA: Diagnosis not present

## 2023-12-03 NOTE — Telephone Encounter (Signed)
 Hey do you have these where I can send them in mychart?

## 2023-12-03 NOTE — Patient Instructions (Signed)
 Medication Instructions:  Your physician recommends that you continue on your current medications as directed. Please refer to the Current Medication list given to you today.  *If you need a refill on your cardiac medications before your next appointment, please call your pharmacy*  Lab Work: NONE If you have labs (blood work) drawn today and your tests are completely normal, you will receive your results only by: MyChart Message (if you have MyChart) OR A paper copy in the mail If you have any lab test that is abnormal or we need to change your treatment, we will call you to review the results.  Testing/Procedures: NONE  Follow-Up: At [facility], you and your health needs are our priority.  As part of our continuing mission to provide you with exceptional heart care, our providers are all part of one team.  This team includes your primary Cardiologist (physician) and Advanced Practice Providers or APPs (Physician Assistants and Nurse Practitioners) who all work together to provide you with the care you need, when you need it.  Your next appointment:   6 month(s)  Provider:   Veryl Gottron or APP  We recommend signing up for the patient portal called "MyChart".  Sign up information is provided on this After Visit Summary.  MyChart is used to connect with patients for Virtual Visits (Telemedicine).  Patients are able to view lab/test results, encounter notes, upcoming appointments, etc.  Non-urgent messages can be sent to your provider as well.   To learn more about what you can do with MyChart, go to [email or web address].   Other Instructions

## 2023-12-15 ENCOUNTER — Telehealth: Payer: Self-pay

## 2023-12-15 NOTE — Telephone Encounter (Signed)
VOB has been submitted for Orthovisc, bilateral knee.

## 2024-01-02 ENCOUNTER — Other Ambulatory Visit (HOSPITAL_BASED_OUTPATIENT_CLINIC_OR_DEPARTMENT_OTHER): Payer: Self-pay | Admitting: Cardiology

## 2024-01-02 DIAGNOSIS — E119 Type 2 diabetes mellitus without complications: Secondary | ICD-10-CM

## 2024-01-03 DIAGNOSIS — G4733 Obstructive sleep apnea (adult) (pediatric): Secondary | ICD-10-CM | POA: Diagnosis not present

## 2024-01-07 ENCOUNTER — Telehealth: Payer: Self-pay

## 2024-01-07 NOTE — Telephone Encounter (Signed)
 Patient will need 2 more appts.for gel injection for bilateral knee with Dr. Phyllis Breeze.  All information can be found under the referrals tab.

## 2024-01-11 ENCOUNTER — Ambulatory Visit: Admitting: Orthopedic Surgery

## 2024-01-11 DIAGNOSIS — M170 Bilateral primary osteoarthritis of knee: Secondary | ICD-10-CM

## 2024-01-11 DIAGNOSIS — M1712 Unilateral primary osteoarthritis, left knee: Secondary | ICD-10-CM

## 2024-01-11 DIAGNOSIS — M1711 Unilateral primary osteoarthritis, right knee: Secondary | ICD-10-CM

## 2024-01-11 MED ORDER — HYALURONAN 30 MG/2ML IX SOSY: 30.0000 mg | PREFILLED_SYRINGE | INTRA_ARTICULAR | Status: AC

## 2024-01-11 NOTE — Progress Notes (Signed)
 Recheck both knees possible hyaluronic acid injections  Chief complaint bilateral knee pain  Encounter Diagnoses  Name Primary?   Primary osteoarthritis of right knee Yes   Primary osteoarthritis of left knee     On Nov 30, 2023 the patient received injection in both knees with Depo-Medrol .  We considered hyaluronic acid if she did not get better.  However other than some mild crepitation which is less than it was before the injection in the right knee she is not having any pain and her function is at a high level  Patient will let us  know if things change and then we can proceed with Orthovisc injections if needed

## 2024-01-13 ENCOUNTER — Telehealth: Payer: Self-pay | Admitting: Orthopedic Surgery

## 2024-01-13 NOTE — Telephone Encounter (Signed)
 Dr. Areatha pt - spoke w/the pt, she was seen on 01/11/24.  She was scheduled for an office visit , but April added to the appointment note Orthovisc #1 bil knees and sent a message to Dickey Lister and myself that the pt will need two more injections scheduled.  I called the pt today to schedule those and she stated that her knees were doing better and that she opted not to get the injections.  She stated that our office had poor communication that no one ever called and told her that the injections were approved or that she was supposed to get one on 01/11/24 or that she would have a copay for it.  She stated Dr. VEAR was late getting to the office Monday, which he was, he was helping Dr. Onesimo with a surgery.  I explained that unfortunately sometimes emergencies happen that may detain the doctor from arriving on time, not often, but it does happen.  She stated that she understood that, that she was more concerned about our lack of communication.  She stated that she just finished filling out the survey for our office and she had asked Dr. Margrette how she should complete it when she got it and she stated he told her to answer truthfully so maybe we can get more help.  I repeatedly apologized to her.

## 2024-01-13 NOTE — Telephone Encounter (Signed)
 I usually just receive a  referral to submit for the gel injection and then send a message to schedule once she has been approved.   Thanks for letting me know.

## 2024-02-02 DIAGNOSIS — G4733 Obstructive sleep apnea (adult) (pediatric): Secondary | ICD-10-CM | POA: Diagnosis not present

## 2024-02-08 DIAGNOSIS — G4733 Obstructive sleep apnea (adult) (pediatric): Secondary | ICD-10-CM | POA: Diagnosis not present

## 2024-02-15 ENCOUNTER — Ambulatory Visit (HOSPITAL_COMMUNITY)
Admission: RE | Admit: 2024-02-15 | Discharge: 2024-02-15 | Disposition: A | Discharge status: Home / Self Care | Source: Ambulatory Visit | Attending: Internal Medicine | Admitting: Internal Medicine

## 2024-02-15 ENCOUNTER — Other Ambulatory Visit (HOSPITAL_COMMUNITY): Payer: Self-pay | Admitting: Internal Medicine

## 2024-02-15 DIAGNOSIS — R0600 Dyspnea, unspecified: Secondary | ICD-10-CM | POA: Insufficient documentation

## 2024-02-15 DIAGNOSIS — I700 Atherosclerosis of aorta: Secondary | ICD-10-CM | POA: Diagnosis not present

## 2024-02-15 DIAGNOSIS — I10 Essential (primary) hypertension: Secondary | ICD-10-CM | POA: Diagnosis not present

## 2024-02-15 DIAGNOSIS — I5032 Chronic diastolic (congestive) heart failure: Secondary | ICD-10-CM | POA: Diagnosis not present

## 2024-02-15 DIAGNOSIS — Z6841 Body Mass Index (BMI) 40.0 and over, adult: Secondary | ICD-10-CM | POA: Diagnosis not present

## 2024-02-15 DIAGNOSIS — E261 Secondary hyperaldosteronism: Secondary | ICD-10-CM | POA: Diagnosis not present

## 2024-02-16 DIAGNOSIS — M9903 Segmental and somatic dysfunction of lumbar region: Secondary | ICD-10-CM | POA: Diagnosis not present

## 2024-02-16 DIAGNOSIS — M47812 Spondylosis without myelopathy or radiculopathy, cervical region: Secondary | ICD-10-CM | POA: Diagnosis not present

## 2024-02-16 DIAGNOSIS — M9901 Segmental and somatic dysfunction of cervical region: Secondary | ICD-10-CM | POA: Diagnosis not present

## 2024-02-16 DIAGNOSIS — M47816 Spondylosis without myelopathy or radiculopathy, lumbar region: Secondary | ICD-10-CM | POA: Diagnosis not present

## 2024-02-16 DIAGNOSIS — M5441 Lumbago with sciatica, right side: Secondary | ICD-10-CM | POA: Diagnosis not present

## 2024-02-16 DIAGNOSIS — M9902 Segmental and somatic dysfunction of thoracic region: Secondary | ICD-10-CM | POA: Diagnosis not present

## 2024-02-23 DIAGNOSIS — M9901 Segmental and somatic dysfunction of cervical region: Secondary | ICD-10-CM | POA: Diagnosis not present

## 2024-02-23 DIAGNOSIS — M9903 Segmental and somatic dysfunction of lumbar region: Secondary | ICD-10-CM | POA: Diagnosis not present

## 2024-02-23 DIAGNOSIS — M9902 Segmental and somatic dysfunction of thoracic region: Secondary | ICD-10-CM | POA: Diagnosis not present

## 2024-02-23 DIAGNOSIS — M47812 Spondylosis without myelopathy or radiculopathy, cervical region: Secondary | ICD-10-CM | POA: Diagnosis not present

## 2024-02-23 DIAGNOSIS — M5441 Lumbago with sciatica, right side: Secondary | ICD-10-CM | POA: Diagnosis not present

## 2024-02-23 DIAGNOSIS — M47816 Spondylosis without myelopathy or radiculopathy, lumbar region: Secondary | ICD-10-CM | POA: Diagnosis not present

## 2024-02-29 DIAGNOSIS — M9902 Segmental and somatic dysfunction of thoracic region: Secondary | ICD-10-CM | POA: Diagnosis not present

## 2024-02-29 DIAGNOSIS — M5441 Lumbago with sciatica, right side: Secondary | ICD-10-CM | POA: Diagnosis not present

## 2024-02-29 DIAGNOSIS — M47812 Spondylosis without myelopathy or radiculopathy, cervical region: Secondary | ICD-10-CM | POA: Diagnosis not present

## 2024-02-29 DIAGNOSIS — M9901 Segmental and somatic dysfunction of cervical region: Secondary | ICD-10-CM | POA: Diagnosis not present

## 2024-02-29 DIAGNOSIS — M9903 Segmental and somatic dysfunction of lumbar region: Secondary | ICD-10-CM | POA: Diagnosis not present

## 2024-02-29 DIAGNOSIS — M47816 Spondylosis without myelopathy or radiculopathy, lumbar region: Secondary | ICD-10-CM | POA: Diagnosis not present

## 2024-03-04 DIAGNOSIS — G4733 Obstructive sleep apnea (adult) (pediatric): Secondary | ICD-10-CM | POA: Diagnosis not present

## 2024-05-03 ENCOUNTER — Other Ambulatory Visit (HOSPITAL_BASED_OUTPATIENT_CLINIC_OR_DEPARTMENT_OTHER): Payer: Self-pay | Admitting: Cardiology

## 2024-05-03 DIAGNOSIS — I5032 Chronic diastolic (congestive) heart failure: Secondary | ICD-10-CM

## 2024-05-10 ENCOUNTER — Other Ambulatory Visit (HOSPITAL_BASED_OUTPATIENT_CLINIC_OR_DEPARTMENT_OTHER): Payer: Self-pay | Admitting: Cardiology

## 2024-05-10 DIAGNOSIS — E119 Type 2 diabetes mellitus without complications: Secondary | ICD-10-CM

## 2024-05-23 ENCOUNTER — Encounter: Payer: Self-pay | Admitting: Radiology

## 2024-05-23 ENCOUNTER — Other Ambulatory Visit (HOSPITAL_COMMUNITY): Payer: Self-pay | Admitting: Internal Medicine

## 2024-05-23 DIAGNOSIS — Z1231 Encounter for screening mammogram for malignant neoplasm of breast: Secondary | ICD-10-CM

## 2024-06-06 DIAGNOSIS — M9903 Segmental and somatic dysfunction of lumbar region: Secondary | ICD-10-CM | POA: Diagnosis not present

## 2024-06-06 DIAGNOSIS — M5441 Lumbago with sciatica, right side: Secondary | ICD-10-CM | POA: Diagnosis not present

## 2024-06-06 DIAGNOSIS — M9902 Segmental and somatic dysfunction of thoracic region: Secondary | ICD-10-CM | POA: Diagnosis not present

## 2024-06-06 DIAGNOSIS — M47812 Spondylosis without myelopathy or radiculopathy, cervical region: Secondary | ICD-10-CM | POA: Diagnosis not present

## 2024-06-06 DIAGNOSIS — M47816 Spondylosis without myelopathy or radiculopathy, lumbar region: Secondary | ICD-10-CM | POA: Diagnosis not present

## 2024-06-06 DIAGNOSIS — M9901 Segmental and somatic dysfunction of cervical region: Secondary | ICD-10-CM | POA: Diagnosis not present

## 2024-06-08 ENCOUNTER — Ambulatory Visit (HOSPITAL_COMMUNITY)
Admission: RE | Admit: 2024-06-08 | Discharge: 2024-06-08 | Disposition: A | Discharge status: Home / Self Care | Source: Ambulatory Visit | Attending: Internal Medicine | Admitting: Internal Medicine

## 2024-06-08 DIAGNOSIS — Z1231 Encounter for screening mammogram for malignant neoplasm of breast: Secondary | ICD-10-CM | POA: Diagnosis not present

## 2024-06-10 DIAGNOSIS — M47812 Spondylosis without myelopathy or radiculopathy, cervical region: Secondary | ICD-10-CM | POA: Diagnosis not present

## 2024-06-10 DIAGNOSIS — M9902 Segmental and somatic dysfunction of thoracic region: Secondary | ICD-10-CM | POA: Diagnosis not present

## 2024-06-10 DIAGNOSIS — M5441 Lumbago with sciatica, right side: Secondary | ICD-10-CM | POA: Diagnosis not present

## 2024-06-10 DIAGNOSIS — M9901 Segmental and somatic dysfunction of cervical region: Secondary | ICD-10-CM | POA: Diagnosis not present

## 2024-06-10 DIAGNOSIS — M9903 Segmental and somatic dysfunction of lumbar region: Secondary | ICD-10-CM | POA: Diagnosis not present

## 2024-06-10 DIAGNOSIS — M47816 Spondylosis without myelopathy or radiculopathy, lumbar region: Secondary | ICD-10-CM | POA: Diagnosis not present

## 2024-06-13 ENCOUNTER — Other Ambulatory Visit (HOSPITAL_BASED_OUTPATIENT_CLINIC_OR_DEPARTMENT_OTHER): Payer: Self-pay | Admitting: Cardiology

## 2024-06-20 DIAGNOSIS — M47812 Spondylosis without myelopathy or radiculopathy, cervical region: Secondary | ICD-10-CM | POA: Diagnosis not present

## 2024-06-20 DIAGNOSIS — M9901 Segmental and somatic dysfunction of cervical region: Secondary | ICD-10-CM | POA: Diagnosis not present

## 2024-06-20 DIAGNOSIS — M9902 Segmental and somatic dysfunction of thoracic region: Secondary | ICD-10-CM | POA: Diagnosis not present

## 2024-06-20 DIAGNOSIS — M47816 Spondylosis without myelopathy or radiculopathy, lumbar region: Secondary | ICD-10-CM | POA: Diagnosis not present

## 2024-06-20 DIAGNOSIS — M9903 Segmental and somatic dysfunction of lumbar region: Secondary | ICD-10-CM | POA: Diagnosis not present

## 2024-06-20 DIAGNOSIS — M5441 Lumbago with sciatica, right side: Secondary | ICD-10-CM | POA: Diagnosis not present

## 2024-06-21 ENCOUNTER — Other Ambulatory Visit (HOSPITAL_BASED_OUTPATIENT_CLINIC_OR_DEPARTMENT_OTHER): Payer: Self-pay | Admitting: Family

## 2024-06-24 ENCOUNTER — Other Ambulatory Visit: Payer: Self-pay | Admitting: Family

## 2024-06-27 DIAGNOSIS — M47816 Spondylosis without myelopathy or radiculopathy, lumbar region: Secondary | ICD-10-CM | POA: Diagnosis not present

## 2024-06-27 DIAGNOSIS — M9902 Segmental and somatic dysfunction of thoracic region: Secondary | ICD-10-CM | POA: Diagnosis not present

## 2024-06-27 DIAGNOSIS — M5441 Lumbago with sciatica, right side: Secondary | ICD-10-CM | POA: Diagnosis not present

## 2024-06-27 DIAGNOSIS — M9903 Segmental and somatic dysfunction of lumbar region: Secondary | ICD-10-CM | POA: Diagnosis not present

## 2024-06-27 DIAGNOSIS — M9901 Segmental and somatic dysfunction of cervical region: Secondary | ICD-10-CM | POA: Diagnosis not present

## 2024-06-27 DIAGNOSIS — M47812 Spondylosis without myelopathy or radiculopathy, cervical region: Secondary | ICD-10-CM | POA: Diagnosis not present

## 2024-07-03 ENCOUNTER — Other Ambulatory Visit: Payer: Self-pay | Admitting: Cardiology

## 2024-07-04 ENCOUNTER — Ambulatory Visit (INDEPENDENT_AMBULATORY_CARE_PROVIDER_SITE_OTHER): Admitting: Cardiology

## 2024-07-04 ENCOUNTER — Encounter (HOSPITAL_BASED_OUTPATIENT_CLINIC_OR_DEPARTMENT_OTHER): Payer: Self-pay | Admitting: Cardiology

## 2024-07-04 VITALS — BP 110/82 | HR 60 | Ht 68.0 in | Wt 271.0 lb

## 2024-07-04 DIAGNOSIS — R002 Palpitations: Secondary | ICD-10-CM

## 2024-07-04 DIAGNOSIS — T466X5D Adverse effect of antihyperlipidemic and antiarteriosclerotic drugs, subsequent encounter: Secondary | ICD-10-CM

## 2024-07-04 DIAGNOSIS — E8881 Metabolic syndrome: Secondary | ICD-10-CM

## 2024-07-04 DIAGNOSIS — I5032 Chronic diastolic (congestive) heart failure: Secondary | ICD-10-CM | POA: Diagnosis not present

## 2024-07-04 DIAGNOSIS — I2510 Atherosclerotic heart disease of native coronary artery without angina pectoris: Secondary | ICD-10-CM

## 2024-07-04 DIAGNOSIS — I10 Essential (primary) hypertension: Secondary | ICD-10-CM

## 2024-07-04 DIAGNOSIS — R079 Chest pain, unspecified: Secondary | ICD-10-CM | POA: Diagnosis not present

## 2024-07-04 DIAGNOSIS — E782 Mixed hyperlipidemia: Secondary | ICD-10-CM | POA: Diagnosis not present

## 2024-07-04 DIAGNOSIS — G720 Drug-induced myopathy: Secondary | ICD-10-CM

## 2024-07-04 DIAGNOSIS — R0602 Shortness of breath: Secondary | ICD-10-CM | POA: Diagnosis not present

## 2024-07-04 NOTE — Patient Instructions (Signed)
 Look in to getting [facility] device.  Medication Instructions:  No changes *If you need a refill on your cardiac medications before your next appointment, please call your pharmacy*  Lab Work: none   Testing/Procedures: none  Follow-Up: At [facility], you and your health needs are our priority.  As part of our continuing mission to provide you with exceptional heart care, our providers are all part of one team.  This team includes your primary Cardiologist (physician) and Advanced Practice Providers or APPs (Physician Assistants and Nurse Practitioners) who all work together to provide you with the care you need, when you need it.  Your next appointment:   6 month(s)  Provider:   Shelda Bruckner, MD, Rosaline Bane, NP, or Reche Finder, NP

## 2024-07-04 NOTE — Progress Notes (Signed)
 Cardiology Office Note:  .    Date:  07/04/2024  ID:  Meagan Reyes, DOB 1955/08/14, MRN [id number] PCP: Meagan Leta NOVAK, MD  [facility] Providers Cardiologist:  Shelda Bruckner, MD     History of Present Illness: .    Meagan Reyes is a 68 y.o. female with a hx of chronic diastolic heart failure, sinus bradycardia, nonobstructive CAD, aortic atherosclerosis, hypertension, hyperlipidemia, and OSA on CPAP, who is seen for follow-up.    CV history: CT coronary in 2020 with Ca score 1.0, nonobstructive CAD. Has chronic shortness of breath; echo unrevealing, had CPX 10/2022 which did not show any cardiac, vascular, or pulm limitations. Had GI side effects on Ozempic . Had muscle aches on rosuvastatin .  Today: Have more palpitations, happened 3 times on Thanksgiving. 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 Now happening 1-2 times/week, lasts a few minutes. Had an episode this morning, before coffee. Doesn't drink much coffee as this gives her heartburn  Activity-wise, she is not limited physically with anything she wants to do. Notes that she does not push herself routinely, does get short of breath with walking longer distances (like from parking lot to [place]).  Has chest heaviness laying in bed at night last night. Happening about once/week for a while, then not for a long time. Nothing makes it better or worse, goes away on its own after a few minutes. Never exertional. She has history of GERD and feels this is the likely culprit.  ROS:  Denies PND, orthopnea, or unexpected weight gain. No syncope. ROS otherwise negative except as noted.   Studies Reviewed: SABRA    EKG Interpretation Date/Time:  Monday July 04 2024 10:22:29 EST Ventricular Rate:  60 PR Interval:  168 QRS Duration:  84 QT Interval:  410 QTC Calculation: 410 R Axis:   -20  Text Interpretation: Normal sinus rhythm Low voltage QRS When compared with ECG of 03-Dec-2023 10:37, No  significant change was found Confirmed by Bruckner Shelda [phone]) on 07/04/2024 11:01:32 AM    Physical Exam:    VS:  BP 110/82 (BP Location: Left Arm, Patient Position: Sitting, Cuff Size: Large)   Pulse 60   Ht 5' 8 (1.727 m)   Wt 271 lb (122.9 kg)   SpO2 99%   BMI 41.21 kg/m    Wt Readings from Last 3 Encounters:  07/04/24 271 lb (122.9 kg)  12/03/23 277 lb 8 oz (125.9 kg)  11/02/23 280 lb (127 kg)    GEN: Well nourished, well developed in no acute distress HEENT: Normal, moist mucous membranes NECK: No JVD CARDIAC: regular rhythm, normal S1 and S2, no rubs or gallops. No murmur. VASCULAR: Radial and DP pulses 2+ bilaterally. No carotid bruits RESPIRATORY:  Clear to auscultation without rales, wheezing or rhonchi  ABDOMEN: Soft, non-tender, non-distended MUSCULOSKELETAL:  Ambulates independently SKIN: Warm and dry, no edema NEUROLOGIC:  Alert and oriented x 3. No focal neuro deficits noted. PSYCHIATRIC:  Normal affect    ASSESSMENT AND PLAN: .    Palpitations -brief, nonlimiting, but becoming more frequent -discussed monitor vs. Kardia, she will start with the Kardia but contact us  if symptoms worsen  Atypical chest pain: nonexertional, like her GERD symptoms. Prior testing reassuring from cardiac perspective. Reviewed signs/symptoms that need immediate medical attention  Chronic diastolic heart failure Dyspnea -CPX test results reassuring -doing well on Jardiance  10 mg -continue entresto  24-46 mg 1/2 tab BID and spironolactone  25 mg daily -on lasix   and potassium daily   Chronic LE edema, suspect component of chronic venous insufficiency -stable -discussed compression, elevation -no significant change with stopping amlodipine    Hypertension:  -well controlled, BP running low enough that it limits uptitration of meds -continue entresto  (takes 1/2 pill BID), spironolactone  as above   Hypercholesterolemia and hypertriglyceridemia, consistent with mixed  hyperlipidemia Nonobstructive CAD based on CCTA Statin myopathy -LDL goal <70. Lipids 10/07/23: Tchol 196, TG 160, HDL 47, LDL 121 -had muscle aches on rosuvastatin  -discussed recommendations, trial of another statin, alternatives to statins including zetia , nexletol, PCSK9i. Declines at this time. She is using supplements to manage this. I did discuss that supplements have not been shown to have significant risk reduction in heart attack, stroke, or death -is on aspirin  81 mg daily, concerned about bruising. She will monitor for bleeding -reviewed red flag warning signs that need immediate medical attention   BMI 45->41, obesity Type II diabetes (prior A1c 6.5) -this plus lipid pattern, hypertension, diabetes support metabolic syndrome -on ozempic  2 mg weekly dose   OSA: continue CPAP   Cardiac risk counseling and prevention recommendations: -recommend heart healthy/Mediterranean diet, with whole grains, fruits, vegetable, fish, lean meats, nuts, and olive oil. Limit salt. -recommend moderate walking, 3-5 times/week for 30-50 minutes each session. Aim for at least 150 minutes.week. Goal should be pace of 3 miles/hours, or walking 1.5 miles in 30 minutes -recommend avoidance of tobacco products. Avoid excess alcohol.  Dispo: Follow-up in 6 months, or sooner as needed.  Signed, Shelda Bruckner, MD

## 2024-07-20 ENCOUNTER — Encounter (HOSPITAL_BASED_OUTPATIENT_CLINIC_OR_DEPARTMENT_OTHER): Payer: Self-pay | Admitting: Cardiology

## 2024-07-27 ENCOUNTER — Other Ambulatory Visit: Payer: Self-pay | Admitting: Orthopedic Surgery

## 2024-07-27 DIAGNOSIS — M1710 Unilateral primary osteoarthritis, unspecified knee: Secondary | ICD-10-CM

## 2024-07-27 NOTE — Telephone Encounter (Signed)
 Dr. Areatha pt - spoke w/the pt, she is requesting a refill for Meloxicam  15mg  , 90 tablets, 1 tablet by mouth once daily to be sent to [facility], that is who she uses now.

## 2024-07-28 MED ORDER — MELOXICAM 15 MG PO TABS: 15.0000 mg | ORAL_TABLET | Freq: Every day | ORAL | 0 refills | Status: AC

## 2024-08-08 ENCOUNTER — Telehealth: Payer: Self-pay | Admitting: *Deleted

## 2024-08-08 NOTE — Telephone Encounter (Signed)
 Okay to send questionnaire.  Thank you

## 2024-08-08 NOTE — Telephone Encounter (Signed)
 Pt called in. She is due for colonoscopy in May. She wants it done now since patient is having knee surgery in April and will be his caretaker post op. Dr. Cindie, please advise if okay to go ahead and send questionnaire or does she need to wait for May or after?

## 2024-08-08 NOTE — Telephone Encounter (Signed)
 Spoke with pt and advised will send questionnaire. She wanted to come in to have her anal itching addressed. She stated she wakes up about 2-3 times everynight with this. Scheduled appt and also advised can discuss scheduling her colonoscopy at that visit as well.

## 2024-08-18 ENCOUNTER — Other Ambulatory Visit (HOSPITAL_BASED_OUTPATIENT_CLINIC_OR_DEPARTMENT_OTHER): Payer: Self-pay | Admitting: Cardiology

## 2024-08-18 DIAGNOSIS — E119 Type 2 diabetes mellitus without complications: Secondary | ICD-10-CM

## 2024-08-26 ENCOUNTER — Other Ambulatory Visit (HOSPITAL_COMMUNITY): Payer: Self-pay | Admitting: Internal Medicine

## 2024-08-26 DIAGNOSIS — M810 Age-related osteoporosis without current pathological fracture: Secondary | ICD-10-CM

## 2024-09-01 ENCOUNTER — Other Ambulatory Visit (HOSPITAL_COMMUNITY)

## 2024-09-01 ENCOUNTER — Ambulatory Visit: Admitting: Gastroenterology
